# Patient Record
Sex: Female | Born: 1965
Health system: Southern US, Community
[De-identification: ages and names within clinical notes are randomized; demographics above are authoritative.]

## PROBLEM LIST (undated history)

## (undated) DIAGNOSIS — D353 Benign neoplasm of craniopharyngeal duct: Secondary | ICD-10-CM

## (undated) DIAGNOSIS — F419 Anxiety disorder, unspecified: Secondary | ICD-10-CM

## (undated) DIAGNOSIS — E669 Obesity, unspecified: Secondary | ICD-10-CM

## (undated) DIAGNOSIS — D352 Benign neoplasm of pituitary gland: Secondary | ICD-10-CM

## (undated) DIAGNOSIS — J309 Allergic rhinitis, unspecified: Secondary | ICD-10-CM

## (undated) DIAGNOSIS — F329 Major depressive disorder, single episode, unspecified: Secondary | ICD-10-CM

## (undated) DIAGNOSIS — R609 Edema, unspecified: Secondary | ICD-10-CM

## (undated) DIAGNOSIS — J302 Other seasonal allergic rhinitis: Secondary | ICD-10-CM

## (undated) HISTORY — DX: Other seasonal allergic rhinitis: J30.2

## (undated) HISTORY — DX: Allergic rhinitis, unspecified: J30.9

## (undated) HISTORY — DX: Major depressive disorder, single episode, unspecified: F32.9

## (undated) HISTORY — PX: NASAL SINUS SURGERY: SHX719

## (undated) HISTORY — PX: LIPOSUCTION: SHX10

## (undated) HISTORY — DX: Benign neoplasm of craniopharyngeal duct: D35.3

## (undated) HISTORY — DX: Benign neoplasm of pituitary gland: D35.2

## (undated) HISTORY — DX: Anxiety disorder, unspecified: F41.9

## (undated) HISTORY — PX: HAMMER TOE SURGERY: SHX385

## (undated) HISTORY — DX: Edema, unspecified: R60.9

## (undated) HISTORY — DX: Obesity, unspecified: E66.9

---

## 2002-09-13 HISTORY — PX: BREAST SURGERY: SHX581

## 2008-03-04 ENCOUNTER — Ambulatory Visit: Payer: Self-pay | Admitting: Internal Medicine

## 2008-03-04 DIAGNOSIS — F3289 Other specified depressive episodes: Secondary | ICD-10-CM

## 2008-03-04 DIAGNOSIS — J309 Allergic rhinitis, unspecified: Secondary | ICD-10-CM

## 2008-03-04 DIAGNOSIS — F339 Major depressive disorder, recurrent, unspecified: Secondary | ICD-10-CM | POA: Insufficient documentation

## 2008-03-04 DIAGNOSIS — F329 Major depressive disorder, single episode, unspecified: Secondary | ICD-10-CM

## 2008-03-04 HISTORY — DX: Allergic rhinitis, unspecified: J30.9

## 2008-03-04 HISTORY — DX: Other specified depressive episodes: F32.89

## 2008-03-04 HISTORY — DX: Major depressive disorder, single episode, unspecified: F32.9

## 2008-08-22 ENCOUNTER — Telehealth: Payer: Self-pay | Admitting: Internal Medicine

## 2008-10-07 ENCOUNTER — Telehealth (INDEPENDENT_AMBULATORY_CARE_PROVIDER_SITE_OTHER): Payer: Self-pay | Admitting: *Deleted

## 2008-11-15 ENCOUNTER — Telehealth: Payer: Self-pay | Admitting: Internal Medicine

## 2009-02-04 ENCOUNTER — Telehealth: Payer: Self-pay | Admitting: Internal Medicine

## 2009-03-13 ENCOUNTER — Telehealth: Payer: Self-pay | Admitting: Internal Medicine

## 2009-10-07 ENCOUNTER — Telehealth: Payer: Self-pay | Admitting: Internal Medicine

## 2009-10-07 ENCOUNTER — Ambulatory Visit: Payer: Self-pay | Admitting: Internal Medicine

## 2009-10-07 DIAGNOSIS — D353 Benign neoplasm of craniopharyngeal duct: Secondary | ICD-10-CM

## 2009-10-07 DIAGNOSIS — D352 Benign neoplasm of pituitary gland: Secondary | ICD-10-CM

## 2009-10-07 HISTORY — DX: Benign neoplasm of pituitary gland: D35.2

## 2009-10-09 LAB — CONVERTED CEMR LAB
AST: 30 units/L (ref 0–37)
Alkaline Phosphatase: 65 units/L (ref 39–117)
Basophils Relative: 0.5 % (ref 0.0–3.0)
Bilirubin, Direct: 0 mg/dL (ref 0.0–0.3)
Calcium: 9.6 mg/dL (ref 8.4–10.5)
Creatinine, Ser: 0.6 mg/dL (ref 0.4–1.2)
Eosinophils Absolute: 0.4 10*3/uL (ref 0.0–0.7)
Eosinophils Relative: 7.2 % — ABNORMAL HIGH (ref 0.0–5.0)
GFR calc non Af Amer: 115.77 mL/min (ref >60)
Hemoglobin: 14.5 g/dL (ref 12.0–15.0)
Lymphocytes Relative: 29.6 % (ref 12.0–46.0)
MCHC: 33 g/dL (ref 30.0–36.0)
Monocytes Relative: 10.2 % (ref 3.0–12.0)
Neutro Abs: 2.6 10*3/uL (ref 1.4–7.7)
Neutrophils Relative %: 52.5 % (ref 43.0–77.0)
RBC: 4.72 M/uL (ref 3.87–5.11)
Sodium: 140 meq/L (ref 135–145)
Total Protein: 7.3 g/dL (ref 6.0–8.3)
WBC: 5 10*3/uL (ref 4.5–10.5)

## 2009-10-15 ENCOUNTER — Telehealth (INDEPENDENT_AMBULATORY_CARE_PROVIDER_SITE_OTHER): Payer: Self-pay | Admitting: *Deleted

## 2010-03-12 ENCOUNTER — Telehealth: Payer: Self-pay | Admitting: Internal Medicine

## 2010-05-28 ENCOUNTER — Ambulatory Visit: Payer: Self-pay | Admitting: Internal Medicine

## 2010-05-28 DIAGNOSIS — J069 Acute upper respiratory infection, unspecified: Secondary | ICD-10-CM | POA: Insufficient documentation

## 2010-05-29 LAB — CONVERTED CEMR LAB: Prolactin: 54.2 ng/mL

## 2010-05-30 ENCOUNTER — Telehealth: Payer: Self-pay | Admitting: Family Medicine

## 2010-06-01 ENCOUNTER — Telehealth: Payer: Self-pay | Admitting: Internal Medicine

## 2010-06-22 ENCOUNTER — Ambulatory Visit: Payer: Self-pay | Admitting: Internal Medicine

## 2010-06-22 DIAGNOSIS — E669 Obesity, unspecified: Secondary | ICD-10-CM | POA: Insufficient documentation

## 2010-06-22 HISTORY — DX: Obesity, unspecified: E66.9

## 2010-09-28 ENCOUNTER — Telehealth: Payer: Self-pay | Admitting: Internal Medicine

## 2010-10-13 NOTE — Progress Notes (Signed)
Summary: call a nurse  Phone Note Other Incoming Call back at call a nurse   Summary of Call: Recieved call from call a nurse.  Pt was seen by Dr Kirtland Bouchard yesterday, on Abx for sinusitis and requests RX cough syrup for night.  She does NOT have a codeine allergy.  I will send over RX. Initial call taken by: Seymour Bars DO,  May 30, 2010 12:30 PM    New/Updated Medications: CHERATUSSIN AC 100-10 MG/5ML SYRP (GUAIFENESIN-CODEINE) 5-10 ml by mouth at bedtime as needed cough Prescriptions: CHERATUSSIN AC 100-10 MG/5ML SYRP (GUAIFENESIN-CODEINE) 5-10 ml by mouth at bedtime as needed cough  #120 ml x 0   Entered and Authorized by:   Seymour Bars DO   Signed by:   Seymour Bars DO on 05/30/2010   Method used:   Printed then faxed to ...       [facility]  [phone]* (retail)       [address] [place], [place]  [address]       Ph: [phone] or [phone]       Fax: [phone]   RxID:   [id number]

## 2010-10-13 NOTE — Assessment & Plan Note (Signed)
Summary: SINUSITIS? // RS   Vital Signs:  Patient profile:   45 year old female Weight:      163 pounds Temp:     98.5 degrees F oral BP sitting:   122 / 84  (right arm) Cuff size:   regular  Vitals Entered By: Duard Brady LPN (May 28, 2010 11:01 AM) CC: c/o sinus infection? congestion , ear pain Is Patient Diabetic? No   CC:  c/o sinus infection? congestion  and ear pain.  History of Present Illness: 45 year old patient who is seen today with a two day history of sinus congestion, mild sore throat, and itching involving the right ear.  Portion is sinus congestion.  There's been no fever or purulent drainage.  She does complain of some sinus fullness and discomfort, but nonlocalized.  She has a history depression, which has been stable. she  also has a history of a pituitary microadenoma and bromocriptine therapy has been discontinued.  Allergies: 1)  ! Septra Ds (Sulfamethoxazole-Trimethoprim)  Past History:  Past Medical History: Reviewed history from 03/04/2008 and no changes required. prolactinoma Allergic rhinitis Depression pedal edema  Review of Systems       The patient complains of anorexia and hoarseness.  The patient denies fever, weight loss, weight gain, vision loss, decreased hearing, chest pain, syncope, dyspnea on exertion, peripheral edema, prolonged cough, headaches, hemoptysis, abdominal pain, melena, hematochezia, severe indigestion/heartburn, hematuria, incontinence, genital sores, muscle weakness, suspicious skin lesions, transient blindness, difficulty walking, depression, unusual weight change, abnormal bleeding, enlarged lymph nodes, angioedema, and breast masses.    Physical Exam  General:  Well-developed,well-nourished,in no acute distress; alert,appropriate and cooperative throughout examination Head:  Normocephalic and atraumatic without obvious abnormalities. No apparent alopecia or balding. Eyes:  No corneal or conjunctival  inflammation noted. EOMI. Perrla. Funduscopic exam benign, without hemorrhages, exudates or papilledema. Vision grossly normal. Nose:  External nasal examination shows no deformity or inflammation. Nasal mucosa are pink and moist without lesions or exudates. Mouth:  Oral mucosa and oropharynx without lesions or exudates.  Teeth in good repair. Neck:  No deformities, masses, or tenderness noted. Lungs:  Normal respiratory effort, chest expands symmetrically. Lungs are clear to auscultation, no crackles or wheezes. Heart:  Normal rate and regular rhythm. S1 and S2 normal without gallop, murmur, click, rub or other extra sounds.   Impression & Recommendations:  Problem # 1:  URI (ICD-465.9)  Problem # 2:  PITUITARY MICROADENOMA (ICD-227.3)  will check a prolactin level  Orders: Venipuncture (40981) TLB-Prolactin (84146-PROL) Specimen Handling (19147)  Orders: Venipuncture (82956) TLB-Prolactin (84146-PROL) Specimen Handling (21308)  Complete Medication List: 1)  Triamterene-hctz 37.5-25 Mg Caps (Triamterene-hctz) .Marland Kitchen.. 1 once daily 2)  Effexor Xr 150 Mg Cp24 (Venlafaxine hcl) .Marland Kitchen.. 1 once daily 3)  Effexor Xr 75 Mg Xr24h-cap (Venlafaxine hcl) .... One daily ( this is in addition to the 150mg  daily.  will be total 225mg  daily.) 4)  Fluticasone Propionate 50 Mcg/act Susp (Fluticasone propionate) .... Use daily  Patient Instructions: 1)  Get plenty of rest, drink lots of clear liquids, and use Tylenol  for fever and comfort. Return in 7-10 days if you're not better:sooner if you're feeling worse. 2)  ViMOVO one twice daily Prescriptions: FLUTICASONE PROPIONATE 50 MCG/ACT SUSP (FLUTICASONE PROPIONATE) use daily  #4 x 4   Entered and Authorized by:   Gordy Savers  MD   Signed by:   Gordy Savers  MD on 05/28/2010   Method used:   Electronically to  [facility]  [phone]* (retail)       [address] [place], [place]  [address]       Ph: [phone] or  [phone]       Fax: [phone]   RxID:   [id number] EFFEXOR XR 75 MG XR24H-CAP (VENLAFAXINE HCL) one daily ( this is in addition to the 150mg  daily.  Will be total 225mg  daily.)  #30 x 5   Entered and Authorized by:   Gordy Savers  MD   Signed by:   Gordy Savers  MD on 05/28/2010   Method used:   Electronically to        [facility]  [phone]* (retail)       [address] [place], [place]  [address]       Ph: [phone] or [phone]       Fax: [phone]   RxID:   [id number] EFFEXOR XR 150 MG  CP24 (VENLAFAXINE HCL) 1 once daily  #90 Capsule x 4   Entered and Authorized by:   Gordy Savers  MD   Signed by:   Gordy Savers  MD on 05/28/2010   Method used:   Electronically to        [facility]  [phone]* (retail)       [address] [place], [place]  [address]       Ph: [phone] or [phone]       Fax: [phone]   RxID:   [id number] TRIAMTERENE-HCTZ 37.5-25 MG  CAPS (TRIAMTERENE-HCTZ) 1 once daily  #90 x 3   Entered and Authorized by:   Gordy Savers  MD   Signed by:   Gordy Savers  MD on 05/28/2010   Method used:   Electronically to        [facility]  [phone]* (retail)       [address] [place], [place]  [address]       Ph: [phone] or [phone]       Fax: [phone]   RxID:   [id number]

## 2010-10-13 NOTE — Progress Notes (Signed)
Summary: Pt did not rcv script for Valium as discussed  Phone Note Call from Patient Call back at Home Phone [phone]   Caller: Patient Summary of Call: Pt called and said that she thought Dr, Amador Cunas was going to prescribed Valium, but pt never rcvd script for this med. [facility].  Initial call taken by: Lucy Antigua,  October 07, 2009 10:13 AM  Follow-up for Phone Call        generic Valium 5 mg  #50 one twice daily as needed  RF 2 Follow-up by: Gordy Savers  MD,  October 07, 2009 1:09 PM  Additional Follow-up for Phone Call Additional follow up Details #1::        Rx Called In Additional Follow-up by: Raechel Ache, RN,  October 07, 2009 1:26 PM

## 2010-10-13 NOTE — Progress Notes (Signed)
Summary: Call-A-Nurse Report    Call-A-Nurse Triage Call Report Triage Record Num: [id number] Operator: Patriciaann Clan Patient Name: Chelsea Dyer Call Date & Time: 05/30/2010 12:14:32PM Patient Phone: [phone] PCP: Gordy Savers Patient Gender: Female PCP Fax : [phone] Patient DOB: [birth year] Practice Name: [facility] Reason for Call: LMP 05/29/10. Patient calling. States developed cough, itching of ears, sore throat, chest congestion. Onset 05/29/10. Afebrile. States expectorating green sputum. Patient states she was seen in ofice 05/29/10 and started on antibiotic. Patient requesting cough medication with codiene to use at night. [facility] @ [phone]. 1227: Dr. Seymour Bars notified. Order received: Advise patient that a Rx will be faxed to her Pharmacy. Patient advised of above. Call back parameters reviewed. Patient verbalizes undertanding. Protocol(s) Used: Upper Respiratory Infection (URI) Recommended Outcome per Protocol: See Alyzabeth Pontillo within 24 hours Override Outcome if Used in Protocol: Call Erum Cercone Immediately RN Reason for Override Outcome: Per Caller Request. Reason for Outcome: Productive cough with colored sputum (other than clear or white sputum) Care Advice:  ~ Use a cool mist humidifier to moisten air. Be sure to clean according to manufacturer's instructions.  ~ May inhale steam from hot shower or heated water. Be careful to avoid burns. Limit or avoid exposure to irritants and allergens (e.g. air pollution, smoke/smoking, chemicals, dust, pollen, pet dander, etc.)  ~ Increase fluids to 8-12 eight oz (1.6 to 2.4 liters) glasses per day, half of them to be water. Soups, popsicles, fruit juices, non-caffeinated sodas (unless restricting sodium intake), jello, broths, decaf teas, etc. are all okay. Warm fluids can be soothing.  ~  ~ If you can, stop smoking now and avoid all secondhand smoke.  ~ Warm fluids may  help, or try a mixture of honey and lemon juice in warm tea.  ~ SYMPTOM / CONDITION MANAGEMENT  ~ CAUTIONS Coughing up mucus or phlegm helps to get rid of an infection. A productive cough should not be stopped. A cough medicine with guaifenesin (Robitussin, Mucinex) can help loosen the mucus. Cough medicine with dextromethorphan (DM) should be avoided. Drinking lots of fluids can help loosen the mucus too, especially warm fluids.  ~ Call Glennis Montenegro if has a fever over 101.5 F (38.6 C) that has not responded to home care measures, having shaking chills or any fever in someone immunocompromised/frail elderly.  ~ 05/30/2010 12:33:38PM Page 1 of 1 [id number]

## 2010-10-13 NOTE — Assessment & Plan Note (Signed)
Summary: trouble losing wt/njr   Vital Signs:  Patient profile:   45 year old female Height:      62 inches Weight:      165 pounds BMI:     30.29 BP sitting:   108 / 80  (left arm) Cuff size:   regular  Vitals Entered By: Duard Brady LPN (June 22, 2010 4:16 PM) CC: c/o not able to lose wt Is Patient Diabetic? No   CC:  c/o not able to lose wt.  History of Present Illness: 45 year old patient who is seen today with a chief complaint of inability to lose weight.  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  She has been on a diet plan and has been a participant in Weight Wat[employer] in the past.  Her weight has been static.  She had a thyroid test checked recently had a walk-in lab.  Thyroid function studies in January were normal.  In general, she feels well.  No symptoms of hypothyroidism, and no family history.  She does have a history of depression on Effexor  Allergies: 1)  ! Septra Ds (Sulfamethoxazole-Trimethoprim)  Past History:  Past Medical History: prolactinoma Allergic rhinitis Depression pedal edema obesity  Review of Systems       The patient complains of weight gain.  The patient denies anorexia, fever, weight loss, vision loss, decreased hearing, hoarseness, chest pain, syncope, dyspnea on exertion, peripheral edema, prolonged cough, headaches, hemoptysis, abdominal pain, melena, hematochezia, severe indigestion/heartburn, hematuria, incontinence, genital sores, muscle weakness, suspicious skin lesions, transient blindness, difficulty walking, depression, unusual weight change, abnormal bleeding, enlarged lymph nodes, angioedema, and breast masses.    Physical Exam  General:  overweight-appearing.  normal blood pressureoverweight-appearing.   Head:  Normocephalic and atraumatic without obvious abnormalities. No apparent alopecia or balding. Mouth:  Oral mucosa and oropharynx without lesions or exudates.  Teeth  in good repair. Lungs:  Normal respiratory effort, chest expands symmetrically. Lungs are clear to auscultation, no crackles or wheezes. Heart:  Normal rate and regular rhythm. S1 and S2 normal without gallop, murmur, click, rub or other extra sounds. Neurologic:  reflexes normal   Impression & Recommendations:  Problem # 1:  EXOGENOUS OBESITY (ICD-278.00)  Problem # 2:  PITUITARY MICROADENOMA (ICD-227.3) will check a prolactin level at the next office visit  Complete Medication List: 1)  Triamterene-hctz 37.5-25 Mg Caps (Triamterene-hctz) .Marland Kitchen.. 1 once daily 2)  Effexor Xr 150 Mg Cp24 (Venlafaxine hcl) .Marland Kitchen.. 1 once daily 3)  Effexor Xr 75 Mg Xr24h-cap (Venlafaxine hcl) .... One daily ( this is in addition to the 150mg  daily.  will be total 225mg  daily.) 4)  Fluticasone Propionate 50 Mcg/act Susp (Fluticasone propionate) .... Use daily 5)  Cheratussin Ac 100-10 Mg/82ml Syrp (Guaifenesin-codeine) .... 5-10 ml by mouth at bedtime as needed cough 6)  Phentermine Hcl 37.5 Mg Caps (Phentermine hcl) .... One every am  Other Orders: Flu Vaccine 76yrs + (40973) Admin 1st Vaccine (53299)  Patient Instructions: 1)  It is important that you exercise regularly at least 20 minutes 5 times a week. If you develop chest pain, have severe difficulty breathing, or feel very tired , stop exercising immediately and seek medical attention. 2)  You need to lose weight. Consider a lower calorie diet and regular exercise.  3)  Please schedule a follow-up appointment in 2 months. Prescriptions: PHENTERMINE HCL 37.5 MG CAPS (PHENTERMINE HCL) one every am  #50 x 1  Entered and Authorized by:   Gordy Savers  MD   Signed by:   Gordy Savers  MD on 06/22/2010   Method used:   Print then Give to Patient   RxID:   [id number]    Immunizations Administered:  Influenza Vaccine # 1:    Vaccine Type: Fluvax 3+    Site: right deltoid    Mfr: GlaxoSmithKline    Dose: 0.5 ml    Route: IM     Given by: Duard Brady LPN    Exp. Date: 03/13/2011    Lot #: [id number]    VIS given: 04/07/10 version given June 22, 2010.    Physician counseled: yes  Flu Vaccine Consent Questions:    Do you have a history of severe allergic reactions to this vaccine? no    Any prior history of allergic reactions to egg and/or gelatin? no    Do you have a sensitivity to the preservative Thimersol? no    Do you have a past history of Guillan-Barre Syndrome? no    Do you currently have an acute febrile illness? no    Have you ever had a severe reaction to latex? no    Vaccine information given and explained to patient? yes    Are you currently pregnant? no

## 2010-10-13 NOTE — Progress Notes (Signed)
Summary: refill triamterene  Phone Note Refill Request Call back at Home Phone [phone] Message from:  Patient--live call  Refills Requested: Medication #1:  TRIAMTERENE-HCTZ 37.5-25 MG  CAPS 1 once daily [facility].  Initial call taken by: Warnell Forester,  March 12, 2010 11:30 AM    Prescriptions: TRIAMTERENE-HCTZ 37.5-25 MG  CAPS (TRIAMTERENE-HCTZ) 1 once daily  #90 x 3   Entered by:   Duard Brady LPN   Authorized by:   Gordy Savers  MD   Signed by:   Duard Brady LPN on 65/78/4696   Method used:   Electronically to        [facility]  [phone]* (retail)       [address] [place], [place]  [address]       Ph: [phone] or [phone]       Fax: [phone]   RxID:   [id number]

## 2010-10-13 NOTE — Assessment & Plan Note (Signed)
Summary: MED CHECK//SLM   Vital Signs:  Patient profile:   45 year old female Weight:      163 pounds BP sitting:   120 / 90  (left arm) Cuff size:   regular  Vitals Entered By: Raechel Ache, RN (October 07, 2009 8:46 AM) CC: Med check.   CC:  Med check..  History of Present Illness: 45 year old patient who has not been seen since she established with this practice  about a year and a half ago.  She has been on bromocriptine for a prolactinoma for about 12 years.  This has raised her health insurance rates and she is wondering if this medicine could perhaps be discontinued.  She has a history of depression and allergic rhinitis.  She is still having difficulty with a custody battle and is requesting Valium to take p.r.n.  She has not taken this in a number of years, but has done well in the past.  Her depression has been stable  Allergies: 1)  ! Septra Ds (Sulfamethoxazole-Trimethoprim)  Past History:  Past Medical History: Reviewed history from 03/04/2008 and no changes required. prolactinoma Allergic rhinitis Depression pedal edema  Family History: Reviewed history from 03/04/2008 and no changes required. no details of her father's health unknown mother, age 72, history of osteoarthritis, otherwise in excellent health  One brother two sisters in good health  Social History: Reviewed history from 03/04/2008 and no changes required. Divorced Never Smoked has a daughter, age 49 with Down syndrome, who she shares custody; ex-husband lives in [place]  Review of Systems       The patient complains of depression.  The patient denies anorexia, fever, weight loss, weight gain, vision loss, decreased hearing, hoarseness, chest pain, syncope, dyspnea on exertion, peripheral edema, prolonged cough, headaches, hemoptysis, abdominal pain, melena, hematochezia, severe indigestion/heartburn, hematuria, incontinence, genital sores, muscle weakness, suspicious skin lesions, transient  blindness, difficulty walking, unusual weight change, abnormal bleeding, enlarged lymph nodes, angioedema, and breast masses.    Physical Exam  General:  overweight-appearing.  normal blood pressureoverweight-appearing.   Head:  Normocephalic and atraumatic without obvious abnormalities. No apparent alopecia or balding. Eyes:  No corneal or conjunctival inflammation noted. EOMI. Perrla. Funduscopic exam benign, without hemorrhages, exudates or papilledema. Vision grossly normal. Ears:  External ear exam shows no significant lesions or deformities.  Otoscopic examination reveals clear canals, tympanic membranes are intact bilaterally without bulging, retraction, inflammation or discharge. Hearing is grossly normal bilaterally. Mouth:  Oral mucosa and oropharynx without lesions or exudates.  Teeth in good repair. Neck:  No deformities, masses, or tenderness noted. Chest Wall:  No deformities, masses, or tenderness noted. Lungs:  Normal respiratory effort, chest expands symmetrically. Lungs are clear to auscultation, no crackles or wheezes. Heart:  Normal rate and regular rhythm. S1 and S2 normal without gallop, murmur, click, rub or other extra sounds.   Impression & Recommendations:  Problem # 1:  DEPRESSION (ICD-311)  Her updated medication list for this problem includes:    Effexor Xr 150 Mg Cp24 (Venlafaxine hcl) .Marland Kitchen... 1 once daily    Effexor Xr 75 Mg Xr24h-cap (Venlafaxine hcl) ..... One daily ( this is in addition to the 150mg  daily.  will be total 225mg  daily.)    Diazepam 5 Mg Tabs (Diazepam) ..... One twice daily as needed for anxiety    Her updated medication list for this problem includes:    Effexor Xr 150 Mg Cp24 (Venlafaxine hcl) .Marland Kitchen... 1 once daily    Effexor Xr 75  Mg Xr24h-cap (Venlafaxine hcl) ..... One daily ( this is in addition to the 150mg  daily.  will be total 225mg  daily.)    Diazepam 5 Mg Tabs (Diazepam) ..... One twice daily as needed for anxiety  Problem # 2:   ALLERGIC RHINITIS (ICD-477.9)  The following medications were removed from the medication list:    Xyzal 5 Mg Tabs (Levocetirizine dihydrochloride) .Marland Kitchen... 1 at bedtime    Astelin 137 Mcg/spray Soln (Azelastine hcl) ..... Uad    Nasonex 50 Mcg/act Susp (Mometasone furoate) ..... Uad Her updated medication list for this problem includes:    Fluticasone Propionate 50 Mcg/act Susp (Fluticasone propionate) ..... Use daily    The following medications were removed from the medication list:    Xyzal 5 Mg Tabs (Levocetirizine dihydrochloride) .Marland Kitchen... 1 at bedtime    Astelin 137 Mcg/spray Soln (Azelastine hcl) ..... Uad    Nasonex 50 Mcg/act Susp (Mometasone furoate) ..... Uad Her updated medication list for this problem includes:    Fluticasone Propionate 50 Mcg/act Susp (Fluticasone propionate) ..... Use daily  Problem # 3:  PITUITARY MICROADENOMA (ICD-227.3)  will check a prolactin level today and repeat in 3 months; will hold bromocriptine  Complete Medication List: 1)  Triamterene-hctz 37.5-25 Mg Caps (Triamterene-hctz) .Marland Kitchen.. 1 once daily 2)  Effexor Xr 150 Mg Cp24 (Venlafaxine hcl) .Marland Kitchen.. 1 once daily 3)  Bromocriptine Mesylate 2.5 Mg Tabs (Bromocriptine mesylate) .Marland Kitchen.. 1 once daily 4)  Effexor Xr 75 Mg Xr24h-cap (Venlafaxine hcl) .... One daily ( this is in addition to the 150mg  daily.  will be total 225mg  daily.) 5)  Fluticasone Propionate 50 Mcg/act Susp (Fluticasone propionate) .... Use daily 6)  Diazepam 5 Mg Tabs (Diazepam) .... One twice daily as needed for anxiety  Other Orders: Venipuncture (16109) TLB-BMP (Basic Metabolic Panel-BMET) (80048-METABOL) TLB-CBC Platelet - w/Differential (85025-CBCD) TLB-Hepatic/Liver Function Pnl (80076-HEPATIC) TLB-TSH (Thyroid Stimulating Hormone) (84443-TSH) TLB-Prolactin (84146-PROL)  Patient Instructions: 1)  Please schedule a follow-up appointment in 3 months. 2)  Limit your Sodium (Salt). 3)  It is important that you exercise regularly at  least 20 minutes 5 times a week. If you develop chest pain, have severe difficulty breathing, or feel very tired , stop exercising immediately and seek medical attention. Prescriptions: DIAZEPAM 5 MG TABS (DIAZEPAM) one twice daily as needed for anxiety  #50 x 2   Entered and Authorized by:   Gordy Savers  MD   Signed by:   Gordy Savers  MD on 10/07/2009   Method used:   Print then Give to Patient   RxID:   [id number] FLUTICASONE PROPIONATE 50 MCG/ACT SUSP (FLUTICASONE PROPIONATE) use daily  #4 x 4   Entered and Authorized by:   Gordy Savers  MD   Signed by:   Gordy Savers  MD on 10/07/2009   Method used:   Print then Give to Patient   RxID:   [id number] EFFEXOR XR 75 MG XR24H-CAP (VENLAFAXINE HCL) one daily ( this is in addition to the 150mg  daily.  Will be total 225mg  daily.)  #30 x 5   Entered and Authorized by:   Gordy Savers  MD   Signed by:   Gordy Savers  MD on 10/07/2009   Method used:   Print then Give to Patient   RxID:   [id number] BROMOCRIPTINE MESYLATE 2.5 MG  TABS (BROMOCRIPTINE MESYLATE) 1 once daily  #90 x 4   Entered and Authorized by:   Gordy Savers  MD  Signed by:   Gordy Savers  MD on 10/07/2009   Method used:   Print then Give to Patient   RxID:   [id number] EFFEXOR XR 150 MG  CP24 (VENLAFAXINE HCL) 1 once daily  #90 Capsule x 4   Entered and Authorized by:   Gordy Savers  MD   Signed by:   Gordy Savers  MD on 10/07/2009   Method used:   Print then Give to Patient   RxID:   [id number] TRIAMTERENE-HCTZ 37.5-25 MG  CAPS (TRIAMTERENE-HCTZ) 1 once daily  #30 x 4   Entered and Authorized by:   Gordy Savers  MD   Signed by:   Gordy Savers  MD on 10/07/2009   Method used:   Print then Give to Patient   RxID:   [id number]

## 2010-10-14 ENCOUNTER — Encounter: Payer: Self-pay | Admitting: Internal Medicine

## 2010-10-15 ENCOUNTER — Ambulatory Visit (INDEPENDENT_AMBULATORY_CARE_PROVIDER_SITE_OTHER): Payer: Managed Care, Other (non HMO) | Admitting: Internal Medicine

## 2010-10-15 ENCOUNTER — Encounter: Payer: Self-pay | Admitting: Internal Medicine

## 2010-10-15 DIAGNOSIS — J019 Acute sinusitis, unspecified: Secondary | ICD-10-CM

## 2010-10-15 DIAGNOSIS — J309 Allergic rhinitis, unspecified: Secondary | ICD-10-CM

## 2010-10-15 MED ORDER — DOXYCYCLINE HYCLATE 100 MG PO TABS: 100.0000 mg | ORAL_TABLET | Freq: Two times a day (BID) | ORAL | Status: AC

## 2010-10-15 MED ORDER — HYDROCODONE-HOMATROPINE 5-1.5 MG/5ML PO SYRP
5.0000 mL | ORAL_SOLUTION | Freq: Four times a day (QID) | ORAL | Status: AC | PRN
Start: 2010-10-15 — End: 2010-10-25

## 2010-10-15 NOTE — Patient Instructions (Signed)
Take your antibiotic as prescribed until ALL of it is gone, but stop if you develop a rash, swelling, or any side effects of the medication.  Contact our office as soon as possible if  there are side effects of the medication. 

## 2010-10-15 NOTE — Progress Notes (Signed)
Summary: refill phentermine  Phone Note Refill Request Message from:  Fax from Pharmacy on September 28, 2010 12:23 PM  Refills Requested: Medication #1:  PHENTERMINE HCL 37.5 MG CAPS one every am.   [facility]: [facility]/2010 [facility]   Method Requested: Fax to Local Pharmacy Initial call taken by: Duard Brady LPN,  September 28, 2010 12:24 PM  Follow-up for Phone Call        #60 no RF Follow-up by: Gordy Savers  MD,  September 28, 2010 12:33 PM    Prescriptions: PHENTERMINE HCL 37.5 MG CAPS (PHENTERMINE HCL) one every am  #60 x 0   Entered by:   Duard Brady LPN   Authorized by:   Gordy Savers  MD   Signed by:   Duard Brady LPN on 04/54/0981   Method used:   Historical   RxID:   [id number]

## 2010-10-15 NOTE — Progress Notes (Signed)
  Subjective:    Patient ID: Chelsea Dyer, female    DOB: Sep 30, 1965, 45 y.o.   MRN: [id number]  HPI   45 year old patient who is seen today with a 5-day history of worsening sinus pain.  She has had yellow to green discharge and also has had a mildly productive cough.  She has had headache, sinus pressure, ear pain.  She has had low-grade fever and a general sense of unwellness.  She has been using Mucinex and OTC medications without much benefit.  She does have a history of allergic rhinitis.  Allergies include sulfa.   Review of Systems  Constitutional: Negative for fever and fatigue.  HENT: Positive for ear pain, congestion, rhinorrhea and postnasal drip. Negative for hearing loss, sore throat, dental problem, sinus pressure and tinnitus.   Eyes: Negative for pain, discharge and visual disturbance.  Respiratory: Positive for cough (productive of yellow sputum). Negative for shortness of breath.   Cardiovascular: Negative for chest pain, palpitations and leg swelling.  Gastrointestinal: Negative for nausea, vomiting, abdominal pain, diarrhea, constipation, blood in stool and abdominal distention.  Genitourinary: Negative for dysuria, urgency, frequency, hematuria, flank pain, vaginal bleeding, vaginal discharge, difficulty urinating, vaginal pain and pelvic pain.  Musculoskeletal: Negative for joint swelling, arthralgias and gait problem.  Skin: Negative for rash.  Neurological: Negative for dizziness, syncope, speech difficulty, weakness, numbness and headaches.  Hematological: Negative for adenopathy. Does not bruise/bleed easily.  Psychiatric/Behavioral: Negative for behavioral problems, dysphoric mood and agitation. The patient is not nervous/anxious.        Objective:   Physical Exam  Constitutional: She is oriented to person, place, and time. She appears well-developed and well-nourished.  HENT:  Head: Normocephalic and atraumatic.  Right Ear: External ear normal.  Left Ear:  External ear normal.       Slight erythema of the oropharynx Both tympanic membranes were slightly dull Mild left maxillary sinus tenderness  Eyes: Conjunctivae and EOM are normal. Pupils are equal, round, and reactive to light.  Neck: Normal range of motion. Neck supple. No thyromegaly present.  Cardiovascular: Normal rate, regular rhythm, normal heart sounds and intact distal pulses.   Pulmonary/Chest: Effort normal and breath sounds normal.  Abdominal: Soft. Bowel sounds are normal. She exhibits no mass. There is no tenderness.  Musculoskeletal: Normal range of motion.  Lymphadenopathy:    She has no cervical adenopathy.  Neurological: She is alert and oriented to person, place, and time.  Skin: Skin is warm and dry. No rash noted.  Psychiatric: She has a normal mood and affect. Her behavior is normal.          Assessment & Plan:  1. Sinusitis-  Will continue Mucinex, and treat with doxycycline 100 mg b.i.d.  2.  Allergic rhinitis

## 2010-11-17 ENCOUNTER — Other Ambulatory Visit: Payer: Self-pay | Admitting: Internal Medicine

## 2010-11-23 ENCOUNTER — Ambulatory Visit: Payer: Managed Care, Other (non HMO) | Admitting: Internal Medicine

## 2010-12-07 ENCOUNTER — Other Ambulatory Visit: Payer: Self-pay | Admitting: Internal Medicine

## 2010-12-07 NOTE — Telephone Encounter (Signed)
Please advise - do not see on current med list

## 2010-12-07 NOTE — Telephone Encounter (Signed)
OK  #90  RF 3

## 2010-12-14 ENCOUNTER — Encounter: Payer: Self-pay | Admitting: Internal Medicine

## 2010-12-14 ENCOUNTER — Ambulatory Visit (INDEPENDENT_AMBULATORY_CARE_PROVIDER_SITE_OTHER): Payer: Managed Care, Other (non HMO) | Admitting: Internal Medicine

## 2010-12-14 VITALS — Temp 98.6°F | Wt 168.0 lb

## 2010-12-14 DIAGNOSIS — F3289 Other specified depressive episodes: Secondary | ICD-10-CM

## 2010-12-14 DIAGNOSIS — F329 Major depressive disorder, single episode, unspecified: Secondary | ICD-10-CM

## 2010-12-14 DIAGNOSIS — L259 Unspecified contact dermatitis, unspecified cause: Secondary | ICD-10-CM

## 2010-12-14 DIAGNOSIS — E669 Obesity, unspecified: Secondary | ICD-10-CM

## 2010-12-14 DIAGNOSIS — L255 Unspecified contact dermatitis due to plants, except food: Secondary | ICD-10-CM

## 2010-12-14 DIAGNOSIS — L237 Allergic contact dermatitis due to plants, except food: Secondary | ICD-10-CM

## 2010-12-14 MED ORDER — PREDNISONE 10 MG PO KIT: PACK | ORAL | Status: DC

## 2010-12-14 MED ORDER — METHYLPREDNISOLONE ACETATE 80 MG/ML IJ SUSP
80.0000 mg | Freq: Once | INTRAMUSCULAR | Status: AC
  Administered 2010-12-14: 80 mg via INTRAMUSCULAR

## 2010-12-14 NOTE — Progress Notes (Signed)
  Subjective:    Patient ID: Chelsea Dyer, female    DOB: 15-Aug-1966, 45 y.o.   MRN: [id number]  HPI   45 year old patient who has a prior history of contact dermatitis. 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 and was attempting to be careful with the use of gloves and other protective gear. However she has developed a very pruritic rash over the extremities and anterior chest and neck.  Review of Systems  Skin: Positive for rash.       Objective:   Physical Exam  Constitutional: She appears well-developed and well-nourished. No distress.  Skin: Skin is warm and dry. Rash noted.       Scattered erythematous at times vesicular rash over primarily the arms anterior chest and neck region. This was consistent with a contact dermatitis          Assessment & Plan:  Contact dermatitis. Will treat with Depo-Medrol as well as a 12 day 10 mg prednisone dose pack

## 2010-12-14 NOTE — Patient Instructions (Signed)
Prednisone dosepak as prescribed Use Claritin once daily  Consider bedtime dose of Benadryl  Call or return to clinic prn if these symptoms worsen or fail to improve as anticipated.

## 2010-12-25 ENCOUNTER — Telehealth: Payer: Self-pay | Admitting: *Deleted

## 2010-12-25 MED ORDER — DIAZEPAM 5 MG PO TABS: 5.0000 mg | ORAL_TABLET | Freq: Two times a day (BID) | ORAL | Status: AC | PRN

## 2010-12-25 NOTE — Telephone Encounter (Signed)
Pt states she is is having personal issues and wants to ask Dr. Kirtland Bouchard if he will [facility]

## 2010-12-25 NOTE — Telephone Encounter (Signed)
Generic Valium 5 mg one twice daily as needed for anxiety. #50

## 2010-12-25 NOTE — Telephone Encounter (Signed)
MED ORDERED AND PT AWARE . [facility]

## 2011-01-12 ENCOUNTER — Other Ambulatory Visit: Payer: Self-pay

## 2011-01-12 MED ORDER — PHENTERMINE HCL 37.5 MG PO CAPS: 37.5000 mg | ORAL_CAPSULE | ORAL | Status: DC

## 2011-01-12 NOTE — Telephone Encounter (Signed)
#  60 needs ROV prior to another RF

## 2011-01-12 NOTE — Telephone Encounter (Signed)
[facility] [address] for Phentermine 37.5  Written 09/28/2010 #60 - last filled 10/25/10 Please advise

## 2011-01-12 NOTE — Telephone Encounter (Signed)
[facility] 

## 2011-03-23 ENCOUNTER — Other Ambulatory Visit: Payer: Self-pay

## 2011-03-23 MED ORDER — PHENTERMINE HCL 37.5 MG PO CAPS: 37.5000 mg | ORAL_CAPSULE | ORAL | Status: DC

## 2011-03-23 NOTE — Telephone Encounter (Signed)
[facility] Phentermine - last written 01/12/11  #60 0RF Please advise

## 2011-03-23 NOTE — Telephone Encounter (Signed)
60

## 2011-03-23 NOTE — Telephone Encounter (Signed)
[facility] 

## 2011-04-01 ENCOUNTER — Encounter: Payer: Self-pay | Admitting: Internal Medicine

## 2011-04-01 ENCOUNTER — Ambulatory Visit (INDEPENDENT_AMBULATORY_CARE_PROVIDER_SITE_OTHER): Payer: Managed Care, Other (non HMO) | Admitting: Internal Medicine

## 2011-04-01 VITALS — BP 108/68 | Temp 98.1°F | Wt 171.0 lb

## 2011-04-01 DIAGNOSIS — L819 Disorder of pigmentation, unspecified: Secondary | ICD-10-CM

## 2011-04-01 DIAGNOSIS — L814 Other melanin hyperpigmentation: Secondary | ICD-10-CM

## 2011-04-01 NOTE — Progress Notes (Signed)
  Subjective:    Patient ID: Chelsea Dyer, female    DOB: 05/30/1966, 45 y.o.   MRN: [id number]  HPI  45 year old patient who is seen today concerned about several skin lesions.    Review of Systems  Skin: Positive for rash.       Objective:   Physical Exam  Skin:       The patient had an approximate 1 cm lesion involving her left inner ankle this was vascular blanched with pressure and appeared to be a small hemangioma;  other lesions were present over her arms and lower extremities consistent with solar lentigines. She had a small subcutaneous nodule involving the back of her left arm consistent with a small sebaceous cyst the lesion was palpable but not visible          Assessment & Plan:   Benign skin lesions. Skin care discussed including avoiding excessive sun exposure. She worked return when necessary

## 2011-04-01 NOTE — Patient Instructions (Signed)
Avoid excessive sun exposure. Wear hats and apply sunscreen when appropriate  Call or return to clinic prn if these symptoms worsen or fail to improve as anticipated.

## 2011-04-07 ENCOUNTER — Other Ambulatory Visit: Payer: Self-pay | Admitting: Internal Medicine

## 2011-04-07 DIAGNOSIS — Z1231 Encounter for screening mammogram for malignant neoplasm of breast: Secondary | ICD-10-CM

## 2011-04-15 ENCOUNTER — Ambulatory Visit
Admission: RE | Admit: 2011-04-15 | Discharge: 2011-04-15 | Disposition: A | Discharge status: Home / Self Care | Payer: Managed Care, Other (non HMO) | Source: Ambulatory Visit | Attending: Internal Medicine | Admitting: Internal Medicine

## 2011-04-15 DIAGNOSIS — Z1231 Encounter for screening mammogram for malignant neoplasm of breast: Secondary | ICD-10-CM

## 2011-04-16 ENCOUNTER — Ambulatory Visit: Payer: Managed Care, Other (non HMO)

## 2011-05-18 ENCOUNTER — Ambulatory Visit (INDEPENDENT_AMBULATORY_CARE_PROVIDER_SITE_OTHER): Payer: Managed Care, Other (non HMO) | Admitting: Internal Medicine

## 2011-05-18 ENCOUNTER — Encounter: Payer: Self-pay | Admitting: Internal Medicine

## 2011-05-18 DIAGNOSIS — J069 Acute upper respiratory infection, unspecified: Secondary | ICD-10-CM

## 2011-05-18 DIAGNOSIS — J309 Allergic rhinitis, unspecified: Secondary | ICD-10-CM

## 2011-05-18 NOTE — Patient Instructions (Signed)
Get plenty of rest, Drink lots of  clear liquids, and use Tylenol or ibuprofen for fever and discomfort.    Acute sinusitis symptoms for less than 10 days are generally not helped by antibiotic therapy.  Use saline irrigation, warm  moist compresses and over-the-counter decongestants only as directed.  Call if there is no improvement in 5 to 7 days, or sooner if you develop increasing pain, fever, or any new symptoms.

## 2011-05-18 NOTE — Progress Notes (Signed)
  Subjective:    Patient ID: Chelsea Dyer, female    DOB: 1966/07/07, 45 y.o.   MRN: [id number]  HPI   45 year old patient who presents with a three-day history of URI symptoms. She complains of head and chest congestion and a chief complaint of productive cough. Cough is bothersome and interferes with sleep. There's been no fever. She remains on Flonase as well as nasal irrigation. No focal sinus tenderness or fever. No purulent drainage    Review of Systems  Constitutional: Negative.   HENT: Positive for congestion and rhinorrhea. Negative for hearing loss, sore throat, dental problem, sinus pressure and tinnitus.   Eyes: Negative for pain, discharge and visual disturbance.  Respiratory: Positive for cough. Negative for shortness of breath.   Cardiovascular: Negative for chest pain, palpitations and leg swelling.  Gastrointestinal: Negative for nausea, vomiting, abdominal pain, diarrhea, constipation, blood in stool and abdominal distention.  Genitourinary: Negative for dysuria, urgency, frequency, hematuria, flank pain, vaginal bleeding, vaginal discharge, difficulty urinating, vaginal pain and pelvic pain.  Musculoskeletal: Negative for joint swelling, arthralgias and gait problem.  Skin: Negative for rash.  Neurological: Negative for dizziness, syncope, speech difficulty, weakness, numbness and headaches.  Hematological: Negative for adenopathy.  Psychiatric/Behavioral: Negative for behavioral problems, dysphoric mood and agitation. The patient is not nervous/anxious.        Objective:   Physical Exam  Constitutional: She is oriented to person, place, and time. She appears well-developed and well-nourished.  HENT:  Head: Normocephalic.  Right Ear: External ear normal.  Left Ear: External ear normal.  Mouth/Throat: Oropharynx is clear and moist.  Eyes: Conjunctivae and EOM are normal. Pupils are equal, round, and reactive to light.  Neck: Normal range of motion. Neck supple. No  thyromegaly present.  Cardiovascular: Normal rate, regular rhythm, normal heart sounds and intact distal pulses.   Pulmonary/Chest: Effort normal and breath sounds normal.  Abdominal: Soft. Bowel sounds are normal. She exhibits no mass. There is no tenderness.  Musculoskeletal: Normal range of motion.  Lymphadenopathy:    She has no cervical adenopathy.  Neurological: She is alert and oriented to person, place, and time.  Skin: Skin is warm and dry. No rash noted.  Psychiatric: She has a normal mood and affect. Her behavior is normal.          Assessment & Plan:   Viral URI Allergic rhinitis   We'll continue on Flonase and saline irrigation. We'll treat symptomatically. A call if she does not promptly improve.

## 2011-05-19 LAB — HM MAMMOGRAPHY: HM Mammogram: NEGATIVE

## 2011-05-20 ENCOUNTER — Telehealth: Payer: Self-pay | Admitting: Internal Medicine

## 2011-05-20 MED ORDER — CHLORPHENIRAMINE-HYDROCODONE 8-10 MG/5ML PO LQCR: 5.0000 mL | Freq: Two times a day (BID) | ORAL | Status: AC | PRN

## 2011-05-20 NOTE — Telephone Encounter (Signed)
Spoke with pt- informed of med - order done and called to pharmacy. KIK

## 2011-05-20 NOTE — Telephone Encounter (Signed)
Please advise 

## 2011-05-20 NOTE — Telephone Encounter (Signed)
Pt was recently in and given medication to help her sleep and feels the medication is not working and would like to know what she should do. Pt requesting that you contact her.

## 2011-05-20 NOTE — Telephone Encounter (Signed)
Please call in generic  Tussionex 4 ounces 1 teaspoon every 12 hours

## 2011-06-09 ENCOUNTER — Other Ambulatory Visit: Payer: Self-pay | Admitting: Internal Medicine

## 2011-06-11 ENCOUNTER — Other Ambulatory Visit: Payer: Self-pay | Admitting: Internal Medicine

## 2011-07-14 ENCOUNTER — Ambulatory Visit (INDEPENDENT_AMBULATORY_CARE_PROVIDER_SITE_OTHER): Payer: Managed Care, Other (non HMO) | Admitting: Family Medicine

## 2011-07-14 ENCOUNTER — Encounter: Payer: Self-pay | Admitting: Family Medicine

## 2011-07-14 VITALS — BP 102/78 | Temp 98.2°F | Wt 169.0 lb

## 2011-07-14 DIAGNOSIS — J069 Acute upper respiratory infection, unspecified: Secondary | ICD-10-CM

## 2011-07-14 NOTE — Patient Instructions (Signed)

## 2011-07-15 NOTE — Progress Notes (Signed)
  Subjective:    Patient ID: Chelsea Dyer, female    DOB: Jan 26, 1966, 45 y.o.   MRN: [id number]  HPI Patient seen with few day history of nasal congestion. She has some intermittent itching and pain right ear and also some mild sore throat. Denies any fever. Occasional postnasal drainage. Diffuse body aches but no fever or chills. Some increased malaise. Denies cough. No sick exposures. Has not taken any over-the-counter medications.   Review of Systems  Constitutional: Positive for fatigue. Negative for fever and chills.  HENT: Positive for ear pain, sore throat and postnasal drip.   Respiratory: Negative for cough, shortness of breath and wheezing.   Cardiovascular: Negative for chest pain.  Neurological: Negative for dizziness and headaches.       Objective:   Physical Exam  Constitutional: She appears well-developed and well-nourished.  HENT:  Right Ear: External ear normal.  Left Ear: External ear normal.  Mouth/Throat: Oropharynx is clear and moist.  Neck: Neck supple. No thyromegaly present.  Cardiovascular: Normal rate and regular rhythm.   Pulmonary/Chest: Effort normal and breath sounds normal. No respiratory distress. She has no wheezes. She has no rales.  Lymphadenopathy:    She has no cervical adenopathy.  Skin: No rash noted.          Assessment & Plan:  Viral syndrome. Take over-the-counter anti-inflammatories as needed. No evidence for acute bacterial illness. Followup as needed

## 2011-07-23 ENCOUNTER — Other Ambulatory Visit: Payer: Self-pay | Admitting: Internal Medicine

## 2011-08-18 ENCOUNTER — Other Ambulatory Visit: Payer: Self-pay | Admitting: Internal Medicine

## 2011-08-31 ENCOUNTER — Ambulatory Visit (INDEPENDENT_AMBULATORY_CARE_PROVIDER_SITE_OTHER): Payer: Managed Care, Other (non HMO) | Admitting: Internal Medicine

## 2011-08-31 ENCOUNTER — Encounter: Payer: Self-pay | Admitting: Internal Medicine

## 2011-08-31 VITALS — BP 110/74 | Temp 98.2°F | Wt 170.0 lb

## 2011-08-31 DIAGNOSIS — L259 Unspecified contact dermatitis, unspecified cause: Secondary | ICD-10-CM

## 2011-08-31 DIAGNOSIS — F329 Major depressive disorder, single episode, unspecified: Secondary | ICD-10-CM

## 2011-08-31 MED ORDER — METHYLPREDNISOLONE ACETATE 80 MG/ML IJ SUSP
80.0000 mg | Freq: Once | INTRAMUSCULAR | Status: AC
  Administered 2011-08-31: 80 mg via INTRAMUSCULAR

## 2011-08-31 MED ORDER — VENLAFAXINE HCL ER 150 MG PO CP24: 150.0000 mg | ORAL_CAPSULE | Freq: Every day | ORAL | Status: DC

## 2011-08-31 MED ORDER — FLUTICASONE PROPIONATE 50 MCG/ACT NA SUSP: 2.0000 | Freq: Every day | NASAL | Status: DC

## 2011-08-31 MED ORDER — TRIAMTERENE-HCTZ 37.5-25 MG PO CAPS: 1.0000 | ORAL_CAPSULE | Freq: Every day | ORAL | Status: DC

## 2011-08-31 MED ORDER — VENLAFAXINE HCL ER 75 MG PO CP24: 75.0000 mg | ORAL_CAPSULE | Freq: Every day | ORAL | Status: DC

## 2011-08-31 MED ORDER — TRIAMCINOLONE ACETONIDE 0.1 % EX CREA: TOPICAL_CREAM | Freq: Two times a day (BID) | CUTANEOUS | Status: AC

## 2011-08-31 NOTE — Patient Instructions (Signed)
Call or return to clinic prn if these symptoms worsen or fail to improve as anticipated.

## 2011-08-31 NOTE — Progress Notes (Signed)
  Subjective:    Patient ID: Chelsea Dyer, female    DOB: May 19, 1966, 45 y.o.   MRN: [id number]  HPI  45 year old patient who presents with a chief complaint of a fairly generalized rash. She was doing outdoor work recently and has developed a pruritic dermatitis most marked on the extremities neck and trunk She has a history of depression which has been stable.    Review of Systems  Skin: Positive for rash.  Psychiatric/Behavioral: Positive for dysphoric mood.       Objective:   Physical Exam  Constitutional: She appears well-developed and well-nourished. No distress.  Skin: Rash noted.       Scattered areas of the erythema some areas as papular lesions and others larger plaques some with surrounding scaling          Assessment & Plan:   Contact dermatitis. Will treat with topical triamcinolone as well as Depo-Medrol Depression stable. Medications refilled

## 2011-09-22 ENCOUNTER — Encounter: Payer: Self-pay | Admitting: Family

## 2011-09-22 ENCOUNTER — Ambulatory Visit (INDEPENDENT_AMBULATORY_CARE_PROVIDER_SITE_OTHER): Payer: Managed Care, Other (non HMO) | Admitting: Family

## 2011-09-22 VITALS — BP 120/78 | Temp 98.1°F | Wt 170.0 lb

## 2011-09-22 DIAGNOSIS — R51 Headache: Secondary | ICD-10-CM

## 2011-09-22 DIAGNOSIS — J019 Acute sinusitis, unspecified: Secondary | ICD-10-CM

## 2011-09-22 MED ORDER — AMOXICILLIN 500 MG PO TABS: 1000.0000 mg | ORAL_TABLET | Freq: Two times a day (BID) | ORAL | Status: AC

## 2011-09-22 MED ORDER — TRAMADOL HCL 50 MG PO TABS: 50.0000 mg | ORAL_TABLET | Freq: Three times a day (TID) | ORAL | Status: AC | PRN

## 2011-09-22 NOTE — Progress Notes (Signed)
Subjective:    Patient ID: Chelsea Dyer, female    DOB: 09-10-1966, 46 y.o.   MRN: [id number]  HPI 46 year old white female, nonsmoker, patient of Dr. Kirtland Bouchard. is in with complaints of headaches that have been going on for about a week. She also had sinus drainage, sinus pressure and pain has been going on over the last several days. She also has pain in her teeth. She's been taking Advil with no relief. Reports an increase in fatigue. She also reports decreasing her caffeine intake from 6 cokes a day, to none over the last 4 days. She is drink 6 cokes a day for about one year.   Review of Systems  Constitutional: Negative.   HENT: Positive for congestion and postnasal drip.   Eyes: Negative.   Respiratory: Negative.   Cardiovascular: Negative.   Skin: Negative.   Neurological: Positive for headaches.  Hematological: Negative.   Psychiatric/Behavioral: Negative.    Past Medical History  Diagnosis Date  . 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 EXOGENOUS OBESITY 06/22/2010  . DEPRESSION 03/04/2008  . ALLERGIC RHINITIS 03/04/2008  . Prolactinoma   . Edema     pedal    History   Social History  . Marital Status: Married    Spouse Name: N/A    Number of Children: N/A  . Years of Education: N/A   Occupational History  . Not on file.   Social History Main Topics  . Smoking status: Never Smoker   . Smokeless tobacco: Not on file  . Alcohol Use:   . Drug Use:   . Sexually Active:    Other Topics Concern  . Not on file   Social History Narrative  . No narrative on file    Past Surgical History  Procedure Date  . Breast surgery 2004    augmentation  . Liposuction   . Nasal sinus surgery     Family History  Problem Relation Age of Onset  . Arthritis Mother     osteo    Allergies  Allergen Reactions  . Sulfamethoxazole W/Trimethoprim     Current Outpatient Prescriptions on File Prior to Visit  Medication Sig Dispense Refill  . bromocriptine (PARLODEL) 2.5 MG tablet  TAKE 1 TABLET BY MOUTH EVERY DAY  90 tablet  3  . chlorpheniramine-hydrocodone (TUSSIONEX) 8-10 MG/5ML suspension Take 5 mLs by mouth every 12 (twelve) hours as needed for cough.  60 mL  0  . fluticasone (FLONASE) 50 MCG/ACT nasal spray Place 2 sprays into the nose daily.  32 g  2  . guaiFENesin-codeine (ROBITUSSIN AC) 100-10 MG/5ML syrup Take 5 mLs by mouth at bedtime as needed.        . phentermine 37.5 MG capsule Take 1 capsule (37.5 mg total) by mouth every morning.  60 capsule  0  . triamcinolone cream (KENALOG) 0.1 % Apply topically 2 (two) times daily.  60 g  1  . triamterene-hydrochlorothiazide (DYAZIDE) 37.5-25 MG per capsule Take 1 each (1 capsule total) by mouth daily.  90 capsule  3  . venlafaxine (EFFEXOR-XR) 150 MG 24 hr capsule Take 1 capsule (150 mg total) by mouth daily.  90 capsule  4  . venlafaxine (EFFEXOR-XR) 75 MG 24 hr capsule Take 1 capsule (75 mg total) by mouth daily.  90 capsule  3    BP 120/78  Temp(Src) 98.1 F (36.7 C) (Oral)  Wt 170 lb (77.111 kg)chart    Objective:   Physical Exam  Constitutional: She is oriented to person, place,  and time. She appears well-developed and well-nourished.  HENT:  Right Ear: External ear normal.  Left Ear: External ear normal.  Nose: Nose normal.  Mouth/Throat: Oropharynx is clear and moist.  Neck: Normal range of motion. Neck supple.  Cardiovascular: Normal rate, regular rhythm and normal heart sounds.   Pulmonary/Chest: Effort normal and breath sounds normal.  Musculoskeletal: Normal range of motion.  Neurological: She is alert and oriented to person, place, and time.  Skin: Skin is warm and dry.  Psychiatric: She has a normal mood and affect.          Assessment & Plan:  Assessment: Headache, likely caffeine withdrawal, acute sinusitis  Plan: Encouraged her with over-the-counter Claritin and resume her Flonase 2 sprays any structural today. Amoxicillin 500 mg 2 capsules by mouth twice a day x10 days. Tramadol 50  mg one tablet every 8 hours when necessary pain to aid with her headache. Patient to call the office if symptoms worsen or persist. Recheck as scheduled and when necessary. Directions given on how to properly her caffeine intake in the future.

## 2011-09-22 NOTE — Patient Instructions (Addendum)
Sinusitis Sinuses are air pockets within the bones of your face. The growth of bacteria within a sinus leads to infection. The infection prevents the sinuses from draining. This infection is called sinusitis. SYMPTOMS  There will be different areas of pain depending on which sinuses have become infected.  The maxillary sinuses often produce pain beneath the eyes.   Frontal sinusitis may cause pain in the middle of the forehead and above the eyes.  Other problems (symptoms) include:  Toothaches.   Colored, pus-like (purulent) drainage from the nose.   Swelling, warmth, and tenderness over the sinus areas may be signs of infection.  TREATMENT  Sinusitis is most often determined by an exam.X-rays may be taken. If x-rays have been taken, make sure you obtain your results or find out how you are to obtain them. Your caregiver may give you medications (antibiotics). These are medications that will help kill the bacteria causing the infection. You may also be given a medication (decongestant) that helps to reduce sinus swelling.  HOME CARE INSTRUCTIONS   Only take over-the-counter or prescription medicines for pain, discomfort, or fever as directed by your caregiver.   Drink extra fluids. Fluids help thin the mucus so your sinuses can drain more easily.   Applying either moist heat or ice packs to the sinus areas may help relieve discomfort.   Use saline nasal sprays to help moisten your sinuses. The sprays can be found at your local drugstore.  SEEK IMMEDIATE MEDICAL CARE IF:  You have a fever.   You have increasing pain, severe headaches, or toothache.   You have nausea, vomiting, or drowsiness.   You develop unusual swelling around the face or trouble seeing.  MAKE SURE YOU:   Understand these instructions.   Will watch your condition.   Will get help right away if you are not doing well or get worse.  Document Released: 08/30/2005 Document Revised: 05/12/2011 Document Reviewed:  03/29/2007 [facility] Patient Information 2012 [place], [place].  Headache, General, Unknown Cause The specific cause of your headache may not have been found today. There are many causes and types of headache. A few common ones are:  Tension headache.   Migraine.   Infections (examples: dental and sinus infections).   Bone and/or joint problems in the neck or jaw.   Depression.   Eye problems.  These headaches are not life threatening.  Headaches can sometimes be diagnosed by a patient history and a physical exam. Sometimes, lab and imaging studies (such as x-ray and/or CT scan) are used to rule out more serious problems. In some cases, a spinal tap (lumbar puncture) may be requested. There are many times when your exam and tests may be normal on the first visit even when there is a serious problem causing your headaches. Because of that, it is very important to follow up with your doctor or local clinic for further evaluation. FINDING OUT THE RESULTS OF TESTS  If a radiology test was performed, a radiologist will review your results.   You will be contacted by the emergency department or your physician if any test results require a change in your treatment plan.   Not all test results may be available during your visit. If your test results are not back during the visit, make an appointment with your caregiver to find out the results. Do not assume everything is normal if you have not heard from your caregiver or the medical facility. It is important for you to follow up on  all of your test results.  HOME CARE INSTRUCTIONS   Keep follow-up appointments with your caregiver, or any specialist referral.   Only take over-the-counter or prescription medicines for pain, discomfort, or fever as directed by your caregiver.   Biofeedback, massage, or other relaxation techniques may be helpful.   Ice packs or heat applied to the head and neck can be used. Do this three to four times per day,  or as needed.   Call your doctor if you have any questions or concerns.   If you smoke, you should quit.  SEEK MEDICAL CARE IF:   You develop problems with medications prescribed.   You do not respond to or obtain relief from medications.   You have a change from the usual headache.   You develop nausea or vomiting.  SEEK IMMEDIATE MEDICAL CARE IF:   If your headache becomes severe.   You have an unexplained oral temperature above 102 F (38.9 C), or as your caregiver suggests.   You have a stiff neck.   You have loss of vision.   You have muscular weakness.   You have loss of muscular control.   You develop severe symptoms different from your first symptoms.   You start losing your balance or have trouble walking.   You feel faint or pass out.  MAKE SURE YOU:   Understand these instructions.   Will watch your condition.   Will get help right away if you are not doing well or get worse.  Document Released: 08/30/2005 Document Revised: 05/12/2011 Document Reviewed: 04/18/2008 [facility] Patient Information 2012 [place], [place].

## 2011-09-24 ENCOUNTER — Telehealth: Payer: Self-pay | Admitting: Internal Medicine

## 2011-09-24 MED ORDER — DOXYCYCLINE HYCLATE 100 MG PO TABS: 100.0000 mg | ORAL_TABLET | Freq: Two times a day (BID) | ORAL | Status: AC

## 2011-09-24 NOTE — Telephone Encounter (Signed)
Pt came in to see Chelsea Dyer a few days ago for Headaches,and pressure in face. Pt was put on Amoxicillin and Tramadol. Pt says that meds are not working and she is extremely nauseous, having really bad headaches and pressure in face. Pt is req a med for nause and different meds for the other symptoms. [place] is going out of [place] at 5 am. [facility] [address] [facility]ap today.

## 2011-09-24 NOTE — Telephone Encounter (Signed)
[facility]

## 2011-09-24 NOTE — Telephone Encounter (Signed)
Discontinue amoxicillin;  doxycycline 100 mg #20 one twice daily

## 2011-09-27 ENCOUNTER — Telehealth: Payer: Self-pay | Admitting: Internal Medicine

## 2011-09-27 MED ORDER — FLUCONAZOLE 150 MG PO TABS: 150.0000 mg | ORAL_TABLET | Freq: Once | ORAL | Status: AC

## 2011-09-27 NOTE — Telephone Encounter (Signed)
rx done

## 2011-09-27 NOTE — Telephone Encounter (Signed)
Diflucan 150  #1  One time dose

## 2011-09-27 NOTE — Telephone Encounter (Signed)
Pt has a yeast infection that she says is from being on doxycycline..wants to have something called in

## 2011-09-27 NOTE — Telephone Encounter (Signed)
Please advise 

## 2011-09-28 ENCOUNTER — Telehealth: Payer: Self-pay | Admitting: *Deleted

## 2011-09-28 NOTE — Telephone Encounter (Signed)
Notified pt. 

## 2011-09-28 NOTE — Telephone Encounter (Signed)
Ok to d/c.

## 2011-09-28 NOTE — Telephone Encounter (Signed)
Pt wants to know if she should continue the antibiotics as she is still having sinus symptoms.

## 2011-11-30 ENCOUNTER — Encounter: Payer: Self-pay | Admitting: Internal Medicine

## 2011-11-30 ENCOUNTER — Ambulatory Visit (INDEPENDENT_AMBULATORY_CARE_PROVIDER_SITE_OTHER): Payer: Managed Care, Other (non HMO) | Admitting: Internal Medicine

## 2011-11-30 VITALS — BP 96/62 | Temp 97.9°F | Wt 155.0 lb

## 2011-11-30 DIAGNOSIS — J309 Allergic rhinitis, unspecified: Secondary | ICD-10-CM

## 2011-11-30 DIAGNOSIS — J069 Acute upper respiratory infection, unspecified: Secondary | ICD-10-CM

## 2011-11-30 MED ORDER — METHYLPREDNISOLONE ACETATE 80 MG/ML IJ SUSP
80.0000 mg | Freq: Once | INTRAMUSCULAR | Status: AC
  Administered 2011-11-30: 80 mg via INTRAMUSCULAR

## 2011-11-30 NOTE — Patient Instructions (Signed)
It is important that you exercise regularly, at least 20 minutes 3 to 4 times per week.  If you develop chest pain or shortness of breath seek  medical attention.  Limit your sodium (Salt) intake  Please check your blood pressure on a regular basis.  If it is consistently greater than 150/90, please make an office appointment.  Consider trial off Dyazide

## 2011-11-30 NOTE — Progress Notes (Signed)
  Subjective:    Patient ID: Chelsea Dyer, female    DOB: 1966-09-11, 46 y.o.   MRN: [id number]  HPI  Wt Readings from Last 3 Encounters:  11/30/11 155 lb (70.308 kg)  09/22/11 170 lb (77.111 kg)  08/31/11 170 lb (77.111 kg)    Review of Systems     Objective:   Physical Exam        Assessment & Plan:

## 2011-11-30 NOTE — Progress Notes (Signed)
  Subjective:    Patient ID: Chelsea Dyer, female    DOB: Mar 16, 1966, 46 y.o.   MRN: [id number]  HPI  46 year old patient who is seen today for followup. She has a history of allergic rhinitis and for the past several days has had increasing sinus congestion itchy ears and minimal postnasal drip. She has had slight cough She has been followed at a metabolic clinic and has lost 15 pounds in the past 2 months. Denies any fever or productive cough fever or chills  BP Readings from Last 3 Encounters:  11/30/11 96/62  09/22/11 120/78  08/31/11 110/74   Review of Systems  Constitutional: Negative.   HENT: Negative for hearing loss, congestion, sore throat, rhinorrhea, dental problem, sinus pressure and tinnitus.   Eyes: Negative for pain, discharge and visual disturbance.  Respiratory: Negative for cough and shortness of breath.   Cardiovascular: Negative for chest pain, palpitations and leg swelling.  Gastrointestinal: Negative for nausea, vomiting, abdominal pain, diarrhea, constipation, blood in stool and abdominal distention.  Genitourinary: Negative for dysuria, urgency, frequency, hematuria, flank pain, vaginal bleeding, vaginal discharge, difficulty urinating, vaginal pain and pelvic pain.  Musculoskeletal: Negative for joint swelling, arthralgias and gait problem.  Skin: Negative for rash.  Neurological: Negative for dizziness, syncope, speech difficulty, weakness, numbness and headaches.  Hematological: Negative for adenopathy.  Psychiatric/Behavioral: Negative for behavioral problems, dysphoric mood and agitation. The patient is not nervous/anxious.        Objective:   Physical Exam  Constitutional: She is oriented to person, place, and time. She appears well-developed and well-nourished.  HENT:  Head: Normocephalic.  Right Ear: External ear normal.  Left Ear: External ear normal.  Mouth/Throat: Oropharynx is clear and moist.  Eyes: Conjunctivae and EOM are normal. Pupils are  equal, round, and reactive to light.  Neck: Normal range of motion. Neck supple. No thyromegaly present.  Cardiovascular: Normal rate, regular rhythm, normal heart sounds and intact distal pulses.   Pulmonary/Chest: Effort normal and breath sounds normal.  Abdominal: Soft. Bowel sounds are normal. She exhibits no mass. There is no tenderness.  Musculoskeletal: Normal range of motion.  Lymphadenopathy:    She has no cervical adenopathy.  Neurological: She is alert and oriented to person, place, and time.  Skin: Skin is warm and dry. No rash noted.  Psychiatric: She has a normal mood and affect. Her behavior is normal.          Assessment  & Plan:    Flair allergic rhinitis Weight loss History depression stable  The patient will discontinue diuretic therapy  We'll treat with Depo-Medrol 80 mg

## 2011-12-22 ENCOUNTER — Other Ambulatory Visit: Payer: Self-pay | Admitting: Internal Medicine

## 2012-05-05 ENCOUNTER — Other Ambulatory Visit: Payer: Self-pay | Admitting: Internal Medicine

## 2012-05-05 DIAGNOSIS — Z1231 Encounter for screening mammogram for malignant neoplasm of breast: Secondary | ICD-10-CM

## 2012-05-09 ENCOUNTER — Ambulatory Visit: Payer: Managed Care, Other (non HMO) | Admitting: Internal Medicine

## 2012-05-18 ENCOUNTER — Other Ambulatory Visit (HOSPITAL_COMMUNITY)
Admission: RE | Admit: 2012-05-18 | Discharge: 2012-05-18 | Disposition: A | Discharge status: Home / Self Care | Payer: Managed Care, Other (non HMO) | Source: Ambulatory Visit | Attending: Internal Medicine | Admitting: Internal Medicine

## 2012-05-18 ENCOUNTER — Encounter: Payer: Self-pay | Admitting: Internal Medicine

## 2012-05-18 ENCOUNTER — Ambulatory Visit (INDEPENDENT_AMBULATORY_CARE_PROVIDER_SITE_OTHER): Payer: Managed Care, Other (non HMO) | Admitting: Internal Medicine

## 2012-05-18 VITALS — BP 100/70 | HR 72 | Temp 97.8°F | Resp 20 | Ht 62.0 in | Wt 158.0 lb

## 2012-05-18 DIAGNOSIS — Z0000 Encounter for general adult medical examination without abnormal findings: Secondary | ICD-10-CM

## 2012-05-18 DIAGNOSIS — D352 Benign neoplasm of pituitary gland: Secondary | ICD-10-CM

## 2012-05-18 DIAGNOSIS — Z01419 Encounter for gynecological examination (general) (routine) without abnormal findings: Secondary | ICD-10-CM | POA: Insufficient documentation

## 2012-05-18 DIAGNOSIS — D353 Benign neoplasm of craniopharyngeal duct: Secondary | ICD-10-CM

## 2012-05-18 LAB — LIPID PANEL
Cholesterol: 183 mg/dL (ref 0–200)
HDL: 64.6 mg/dL (ref >39.00)
Triglycerides: 75 mg/dL (ref 0.0–149.0)
VLDL: 15 mg/dL (ref 0.0–40.0)

## 2012-05-18 LAB — CBC WITH DIFFERENTIAL/PLATELET
Basophils Absolute: 0 10*3/uL (ref 0.0–0.1)
Eosinophils Absolute: 0.5 10*3/uL (ref 0.0–0.7)
MCHC: 32.9 g/dL (ref 30.0–36.0)
MCV: 93 fl (ref 78.0–100.0)
Monocytes Absolute: 0.5 10*3/uL (ref 0.1–1.0)
Neutrophils Relative %: 49.7 % (ref 43.0–77.0)
Platelets: 255 10*3/uL (ref 150.0–400.0)
WBC: 5.2 10*3/uL (ref 4.5–10.5)

## 2012-05-18 LAB — COMPREHENSIVE METABOLIC PANEL
Alkaline Phosphatase: 49 U/L (ref 39–117)
BUN: 15 mg/dL (ref 6–23)
CO2: 27 mEq/L (ref 19–32)
Creatinine, Ser: 0.7 mg/dL (ref 0.4–1.2)
GFR: 102.49 mL/min (ref >60.00)
Glucose, Bld: 68 mg/dL — ABNORMAL LOW (ref 70–99)
Sodium: 139 mEq/L (ref 135–145)
Total Bilirubin: 0.7 mg/dL (ref 0.3–1.2)
Total Protein: 6.7 g/dL (ref 6.0–8.3)

## 2012-05-18 LAB — TSH: TSH: 1.66 u[IU]/mL (ref 0.35–5.50)

## 2012-05-18 MED ORDER — TRIAMTERENE-HCTZ 37.5-25 MG PO CAPS: 1.0000 | ORAL_CAPSULE | Freq: Every day | ORAL | Status: DC

## 2012-05-18 MED ORDER — VENLAFAXINE HCL ER 75 MG PO CP24: 75.0000 mg | ORAL_CAPSULE | Freq: Every day | ORAL | Status: DC

## 2012-05-18 MED ORDER — PHENTERMINE HCL 37.5 MG PO CAPS: 37.5000 mg | ORAL_CAPSULE | ORAL | Status: DC

## 2012-05-18 MED ORDER — BROMOCRIPTINE MESYLATE 2.5 MG PO TABS: 2.5000 mg | ORAL_TABLET | Freq: Every day | ORAL | Status: DC

## 2012-05-18 MED ORDER — FLUTICASONE PROPIONATE 50 MCG/ACT NA SUSP: 2.0000 | Freq: Every day | NASAL | Status: DC

## 2012-05-18 MED ORDER — VENLAFAXINE HCL ER 150 MG PO CP24: 150.0000 mg | ORAL_CAPSULE | Freq: Every day | ORAL | Status: DC

## 2012-05-18 NOTE — Progress Notes (Signed)
Subjective:    Patient ID: Chelsea Dyer, female    DOB: 1966-01-02, 46 y.o.   MRN: [id number]  HPI  46 year old patient who is seen today for a health maintenance examination. She has a greater than 10 year history of a pituitary microadenoma. She has been on bromocriptine therapy. She stopped this medication recently and did note some rest tenderness which is improving with resuming her medication. She has a history of breast implants and is scheduled for a followup mammogram next week. She is doing quite well she has a history of depression and mild the exogenous obesity. No new concerns or complaints  Past Medical History  Diagnosis Date  . 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 EXOGENOUS OBESITY 06/22/2010  . DEPRESSION 03/04/2008  . ALLERGIC RHINITIS 03/04/2008  . Prolactinoma   . Edema     pedal    History   Social History  . Marital Status: Married    Spouse Name: N/A    Number of Children: N/A  . Years of Education: N/A   Occupational History  . Not on file.   Social History Main Topics  . Smoking status: Never Smoker   . Smokeless tobacco: Never Used  . Alcohol Use: No  . Drug Use: No  . Sexually Active: Not on file   Other Topics Concern  . Not on file   Social History Narrative  . No narrative on file    Past Surgical History  Procedure Date  . Breast surgery 2004    augmentation  . Liposuction   . Nasal sinus surgery     Family History  Problem Relation Age of Onset  . Arthritis Mother     osteo    Allergies  Allergen Reactions  . Sulfamethoxazole W-Trimethoprim     Current Outpatient Prescriptions on File Prior to Visit  Medication Sig Dispense Refill  . bromocriptine (PARLODEL) 2.5 MG tablet TAKE 1 TABLET BY MOUTH EVERY DAY  90 tablet  2  . chlorpheniramine-hydrocodone (TUSSIONEX) 8-10 MG/5ML suspension Take 5 mLs by mouth every 12 (twelve) hours as needed for cough.  60 mL  0  . fluticasone (FLONASE) 50 MCG/ACT nasal spray Place 2 sprays  into the nose daily.  32 g  2  . guaiFENesin-codeine (ROBITUSSIN AC) 100-10 MG/5ML syrup Take 5 mLs by mouth at bedtime as needed.        . phentermine 37.5 MG capsule Take 1 capsule (37.5 mg total) by mouth every morning.  60 capsule  0  . triamcinolone cream (KENALOG) 0.1 % Apply topically 2 (two) times daily.  60 g  1  . triamterene-hydrochlorothiazide (DYAZIDE) 37.5-25 MG per capsule Take 1 each (1 capsule total) by mouth daily.  90 capsule  3  . venlafaxine (EFFEXOR-XR) 150 MG 24 hr capsule Take 1 capsule (150 mg total) by mouth daily.  90 capsule  4  . venlafaxine (EFFEXOR-XR) 150 MG 24 hr capsule TAKE ONE CAPSULE EVERY DAY  90 capsule  2  . venlafaxine (EFFEXOR-XR) 75 MG 24 hr capsule Take 1 capsule (75 mg total) by mouth daily.  90 capsule  3    BP 100/70  Pulse 72  Temp 97.8 F (36.6 C) (Oral)  Resp 20  Ht 5\' 2"  (1.575 m)  Wt 158 lb (71.668 kg)  BMI 28.90 kg/m2  SpO2 97%   Current Allergies:  ! SEPTRA DS (SULFAMETHOXAZOLE-TRIMETHOPRIM)   Past Medical History:  Reviewed history and no changes required:  prolactinoma  Allergic rhinitis  Depression  pedal  edema   Past Surgical History:  breast augmentation 2004  history of liposuction  Sinus surgery   Family History:  Reviewed history and no changes required:  no details of her father's health unknown  mother, age 57, history of osteoarthritis, otherwise in excellent health h/o CVA One brother two sisters in good health   Social History:  Reviewed history and no changes required:  Divorced  Never Smoked  has a daughter, with Down syndrome, who she shares custody;  ex-husband lives in [place]      Review of Systems  Constitutional: Negative.   HENT: Negative for hearing loss, congestion, sore throat, rhinorrhea, dental problem, sinus pressure and tinnitus.   Eyes: Negative for pain, discharge and visual disturbance.  Respiratory: Negative for cough and shortness of breath.   Cardiovascular: Negative for  chest pain, palpitations and leg swelling.  Gastrointestinal: Negative for nausea, vomiting, abdominal pain, diarrhea, constipation, blood in stool and abdominal distention.  Genitourinary: Negative for dysuria, urgency, frequency, hematuria, flank pain, vaginal bleeding, vaginal discharge, difficulty urinating, vaginal pain and pelvic pain.  Musculoskeletal: Negative for joint swelling, arthralgias and gait problem.  Skin: Negative for rash.  Neurological: Negative for dizziness, syncope, speech difficulty, weakness, numbness and headaches.  Hematological: Negative for adenopathy.  Psychiatric/Behavioral: Negative for behavioral problems, dysphoric mood and agitation. The patient is not nervous/anxious.        Objective:   Physical Exam  Constitutional: She is oriented to person, place, and time. She appears well-developed and well-nourished.  HENT:  Head: Normocephalic and atraumatic.  Right Ear: External ear normal.  Left Ear: External ear normal.  Mouth/Throat: Oropharynx is clear and moist.  Eyes: Conjunctivae and EOM are normal.  Neck: Normal range of motion. Neck supple. No JVD present. No thyromegaly present.  Cardiovascular: Normal rate, regular rhythm, normal heart sounds and intact distal pulses.   No murmur heard. Pulmonary/Chest: Effort normal and breath sounds normal. She has no wheezes. She has no rales.  Abdominal: Soft. Bowel sounds are normal. She exhibits no distension and no mass. There is no tenderness. There is no rebound and no guarding.  Genitourinary: Vagina normal. Guaiac negative stool.  Musculoskeletal: Normal range of motion. She exhibits no edema and no tenderness.  Neurological: She is alert and oriented to person, place, and time. She has normal reflexes. No cranial nerve deficit. She exhibits normal muscle tone. Coordination normal.  Skin: Skin is warm and dry. No rash noted.  Psychiatric: She has a normal mood and affect. Her behavior is normal.           Assessment & Plan:    Preventive health exam Pituitary microadenoma. Will check a prolactin level. Continue present regimen compliance stressed Depression stable we'll refill it Effexor  Recheck in one year or as needed

## 2012-05-18 NOTE — Patient Instructions (Signed)
Limit your sodium (Salt) intake    It is important that you exercise regularly, at least 20 minutes 3 to 4 times per week.  If you develop chest pain or shortness of breath seek  medical attention.  Return in one year for follow-up   

## 2012-05-19 LAB — PROLACTIN: Prolactin: 25.3 ng/mL

## 2012-05-22 NOTE — Progress Notes (Signed)
Quick Note:  Attempt to call pt- VM - LMTCB if questions - labs and pap normal ______

## 2012-06-09 ENCOUNTER — Ambulatory Visit
Admission: RE | Admit: 2012-06-09 | Discharge: 2012-06-09 | Disposition: A | Discharge status: Home / Self Care | Payer: Managed Care, Other (non HMO) | Source: Ambulatory Visit | Attending: Internal Medicine | Admitting: Internal Medicine

## 2012-06-09 DIAGNOSIS — Z1231 Encounter for screening mammogram for malignant neoplasm of breast: Secondary | ICD-10-CM

## 2012-06-26 ENCOUNTER — Encounter: Payer: Self-pay | Admitting: Internal Medicine

## 2012-06-26 ENCOUNTER — Ambulatory Visit (INDEPENDENT_AMBULATORY_CARE_PROVIDER_SITE_OTHER): Payer: Managed Care, Other (non HMO) | Admitting: Internal Medicine

## 2012-06-26 VITALS — BP 100/70 | Temp 98.1°F | Wt 155.0 lb

## 2012-06-26 DIAGNOSIS — J069 Acute upper respiratory infection, unspecified: Secondary | ICD-10-CM

## 2012-06-26 DIAGNOSIS — J309 Allergic rhinitis, unspecified: Secondary | ICD-10-CM

## 2012-06-26 MED ORDER — HYDROCODONE-HOMATROPINE 5-1.5 MG/5ML PO SYRP: 5.0000 mL | ORAL_SOLUTION | Freq: Four times a day (QID) | ORAL | Status: DC | PRN

## 2012-06-26 NOTE — Progress Notes (Signed)
Subjective:    Patient ID: Chelsea Dyer, female    DOB: 1966/09/13, 46 y.o.   MRN: [id number]  HPI  46 year old patient who has been ill for about 7 days. She was seen at an urgent care in [place] and has just completed 7 days of Augmentin. She apparently was treated on day 1 of her symptoms. Today she states that she is still on well with cough and sinus congestion rhinorrhea sore throat and generalized achiness. She has a daughter who was hospitalized for 3 days for pneumonia recently. She is concerned about a community-acquired pneumonia she has allergic rhinitis and has been using Allegra and fluticasone nasal spray Past Medical History  Diagnosis Date  . 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 EXOGENOUS OBESITY 06/22/2010  . DEPRESSION 03/04/2008  . ALLERGIC RHINITIS 03/04/2008  . Prolactinoma   . Edema     pedal    History   Social History  . Marital Status: Married    Spouse Name: N/A    Number of Children: N/A  . Years of Education: N/A   Occupational History  . Not on file.   Social History Main Topics  . Smoking status: Never Smoker   . Smokeless tobacco: Never Used  . Alcohol Use: No  . Drug Use: No  . Sexually Active: Not on file   Other Topics Concern  . Not on file   Social History Narrative  . No narrative on file    Past Surgical History  Procedure Date  . Breast surgery 2004    augmentation  . Liposuction   . Nasal sinus surgery     Family History  Problem Relation Age of Onset  . Arthritis Mother     osteo    Allergies  Allergen Reactions  . Sulfamethoxazole W-Trimethoprim     Current Outpatient Prescriptions on File Prior to Visit  Medication Sig Dispense Refill  . bromocriptine (PARLODEL) 2.5 MG tablet Take 1 tablet (2.5 mg total) by mouth daily.  90 tablet  2  . fluticasone (FLONASE) 50 MCG/ACT nasal spray Place 2 sprays into the nose daily.  32 g  2  . guaiFENesin-codeine (ROBITUSSIN AC) 100-10 MG/5ML syrup Take 5 mLs by mouth at bedtime as  needed.        . phentermine 37.5 MG capsule Take 1 capsule (37.5 mg total) by mouth every morning.  60 capsule  0  . triamcinolone cream (KENALOG) 0.1 % Apply topically 2 (two) times daily.  60 g  1  . triamterene-hydrochlorothiazide (DYAZIDE) 37.5-25 MG per capsule Take 1 each (1 capsule total) by mouth daily.  90 capsule  3  . venlafaxine XR (EFFEXOR-XR) 150 MG 24 hr capsule Take 1 capsule (150 mg total) by mouth daily.  90 capsule  4  . venlafaxine XR (EFFEXOR-XR) 75 MG 24 hr capsule Take 1 capsule (75 mg total) by mouth daily.  90 capsule  3    BP 100/70  Temp 98.1 F (36.7 C) (Oral)  Wt 155 lb (70.308 kg)  LMP 06/26/2012       Review of Systems  Constitutional: Positive for activity change, appetite change and fatigue.  HENT: Positive for congestion and postnasal drip. Negative for hearing loss, sore throat, rhinorrhea, dental problem, sinus pressure and tinnitus.   Eyes: Negative for pain, discharge and visual disturbance.  Respiratory: Positive for cough. Negative for shortness of breath.   Cardiovascular: Negative for chest pain, palpitations and leg swelling.  Gastrointestinal: Negative for nausea, vomiting, abdominal pain, diarrhea, constipation,  blood in stool and abdominal distention.  Genitourinary: Negative for dysuria, urgency, frequency, hematuria, flank pain, vaginal bleeding, vaginal discharge, difficulty urinating, vaginal pain and pelvic pain.  Musculoskeletal: Negative for joint swelling, arthralgias and gait problem.  Skin: Negative for rash.  Neurological: Negative for dizziness, syncope, speech difficulty, weakness, numbness and headaches.  Hematological: Negative for adenopathy.  Psychiatric/Behavioral: Negative for behavioral problems, dysphoric mood and agitation. The patient is not nervous/anxious.        Objective:   Physical Exam  Constitutional: She is oriented to person, place, and time. She appears well-developed and well-nourished.  HENT:    Head: Normocephalic.  Right Ear: External ear normal.  Left Ear: External ear normal.  Mouth/Throat: Oropharynx is clear and moist.  Eyes: Conjunctivae normal and EOM are normal. Pupils are equal, round, and reactive to light.  Neck: Normal range of motion. Neck supple. No thyromegaly present.  Cardiovascular: Normal rate, regular rhythm, normal heart sounds and intact distal pulses.   Pulmonary/Chest: Effort normal and breath sounds normal.  Abdominal: Soft. Bowel sounds are normal. She exhibits no mass. There is no tenderness.  Musculoskeletal: Normal range of motion.  Lymphadenopathy:    She has no cervical adenopathy.  Neurological: She is alert and oriented to person, place, and time.  Skin: Skin is warm and dry. No rash noted.  Psychiatric: She has a normal mood and affect. Her behavior is normal.          Assessment & Plan:  Viral URI with cough. We'll treat symptomatically Allergic rhinitis

## 2012-06-26 NOTE — Patient Instructions (Addendum)
Acute sinusitis symptoms for less than 10 days are generally not helped by antibiotic therapy.  Use saline irrigation, warm  moist compresses and over-the-counter decongestants only as directed.  Call if there is no improvement in 5 to 7 days, or sooner if you develop increasing pain, fever, or any new symptoms. 

## 2012-06-28 ENCOUNTER — Telehealth: Payer: Self-pay | Admitting: Internal Medicine

## 2012-06-28 ENCOUNTER — Encounter: Payer: Self-pay | Admitting: Internal Medicine

## 2012-06-28 ENCOUNTER — Ambulatory Visit (INDEPENDENT_AMBULATORY_CARE_PROVIDER_SITE_OTHER): Payer: Managed Care, Other (non HMO) | Admitting: Internal Medicine

## 2012-06-28 VITALS — BP 90/60 | Temp 98.1°F | Wt 156.0 lb

## 2012-06-28 DIAGNOSIS — F329 Major depressive disorder, single episode, unspecified: Secondary | ICD-10-CM

## 2012-06-28 DIAGNOSIS — J069 Acute upper respiratory infection, unspecified: Secondary | ICD-10-CM

## 2012-06-28 DIAGNOSIS — J309 Allergic rhinitis, unspecified: Secondary | ICD-10-CM

## 2012-06-28 MED ORDER — HYDROCOD POLST-CHLORPHEN POLST 10-8 MG/5ML PO LQCR: 5.0000 mL | Freq: Two times a day (BID) | ORAL | Status: DC

## 2012-06-28 MED ORDER — AZITHROMYCIN 250 MG PO TABS: ORAL_TABLET | ORAL | Status: DC

## 2012-06-28 NOTE — Telephone Encounter (Signed)
Caller: [name]/Patient; Patient Name: Chelsea Dyer; PCP: Eleonore Chiquito [facility]); Best Callback Phone Number: [phone]; Reason for call: Cough/Congestion.  States the cough syrup is not helpful.  Onset of achiness, cough 06/19/12.  Seen in office 06/26/12 but states has not improved.  States feels more weak and tired than when seen.  Cough is preventing her from sleeping.  Afebrile.  States she has mild chest pressure when coughing.  Per cough protocol, emergent symptoms denied; appt scheduled for See Within 24 Hours disposition 06/28/12 1015 with Dr. Amador Cunas.

## 2012-06-28 NOTE — Patient Instructions (Signed)
Use saline irrigation, warm  moist compresses and over-the-counter decongestants only as directed.  Call if there is no improvement in 5 to 7 days, or sooner if you develop increasing pain, fever, or any new symptoms. 

## 2012-06-28 NOTE — Progress Notes (Signed)
Subjective:    Patient ID: Chelsea Dyer, female    DOB: August 23, 1966, 47 y.o.   MRN: [id number]  HPI  46 year old patient who was seen 2 days ago and treated symptomatically for a URI. She was seen at an urgent care in [place] and had completed 7 days of Augmentin. Her daughter has been similarly ill and required a brief hospital stay for pneumonia. Her daughter continues to improve. The patient states that she has developed worsening cough it is now mildly productive. She has felt unwell with worsening weakness fatigue and achiness. She slept very little last night due 2 refractory cough in spite of antitussives medication  Past Medical History  Diagnosis Date  . 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 EXOGENOUS OBESITY 06/22/2010  . DEPRESSION 03/04/2008  . ALLERGIC RHINITIS 03/04/2008  . Prolactinoma   . Edema     pedal    History   Social History  . Marital Status: Married    Spouse Name: N/A    Number of Children: N/A  . Years of Education: N/A   Occupational History  . Not on file.   Social History Main Topics  . Smoking status: Never Smoker   . Smokeless tobacco: Never Used  . Alcohol Use: No  . Drug Use: No  . Sexually Active: Not on file   Other Topics Concern  . Not on file   Social History Narrative  . No narrative on file    Past Surgical History  Procedure Date  . Breast surgery 2004    augmentation  . Liposuction   . Nasal sinus surgery     Family History  Problem Relation Age of Onset  . Arthritis Mother     osteo    Allergies  Allergen Reactions  . Sulfamethoxazole W-Trimethoprim     Current Outpatient Prescriptions on File Prior to Visit  Medication Sig Dispense Refill  . bromocriptine (PARLODEL) 2.5 MG tablet Take 1 tablet (2.5 mg total) by mouth daily.  90 tablet  2  . fluticasone (FLONASE) 50 MCG/ACT nasal spray Place 2 sprays into the nose daily.  32 g  2  . guaiFENesin-codeine (ROBITUSSIN AC) 100-10 MG/5ML syrup Take 5 mLs by mouth at  bedtime as needed.        Marland Kitchen HYDROcodone-homatropine (HYCODAN) 5-1.5 MG/5ML syrup Take 5 mLs by mouth every 6 (six) hours as needed for cough.  120 mL  0  . phentermine 37.5 MG capsule Take 1 capsule (37.5 mg total) by mouth every morning.  60 capsule  0  . triamcinolone cream (KENALOG) 0.1 % Apply topically 2 (two) times daily.  60 g  1  . triamterene-hydrochlorothiazide (DYAZIDE) 37.5-25 MG per capsule Take 1 each (1 capsule total) by mouth daily.  90 capsule  3  . venlafaxine XR (EFFEXOR-XR) 150 MG 24 hr capsule Take 1 capsule (150 mg total) by mouth daily.  90 capsule  4  . venlafaxine XR (EFFEXOR-XR) 75 MG 24 hr capsule Take 1 capsule (75 mg total) by mouth daily.  90 capsule  3    BP 90/60  Temp 98.1 F (36.7 C) (Oral)  Wt 156 lb (70.761 kg)  LMP 06/26/2012        Review of Systems  Constitutional: Positive for fatigue.  HENT: Positive for congestion. Negative for hearing loss, sore throat, rhinorrhea, dental problem, sinus pressure and tinnitus.   Eyes: Negative for pain, discharge and visual disturbance.  Respiratory: Positive for cough. Negative for shortness of breath.   Cardiovascular: Negative  for chest pain, palpitations and leg swelling.  Gastrointestinal: Negative for nausea, vomiting, abdominal pain, diarrhea, constipation, blood in stool and abdominal distention.  Genitourinary: Negative for dysuria, urgency, frequency, hematuria, flank pain, vaginal bleeding, vaginal discharge, difficulty urinating, vaginal pain and pelvic pain.  Musculoskeletal: Positive for arthralgias. Negative for joint swelling and gait problem.  Skin: Negative for rash.  Neurological: Positive for weakness. Negative for dizziness, syncope, speech difficulty, numbness and headaches.  Hematological: Negative for adenopathy.  Psychiatric/Behavioral: Negative for behavioral problems, dysphoric mood and agitation. The patient is not nervous/anxious.        Objective:   Physical Exam    Constitutional: She is oriented to person, place, and time. She appears well-developed and well-nourished.  HENT:  Head: Normocephalic.  Right Ear: External ear normal.  Left Ear: External ear normal.  Mouth/Throat: Oropharynx is clear and moist.  Eyes: Conjunctivae normal and EOM are normal. Pupils are equal, round, and reactive to light.  Neck: Normal range of motion. Neck supple. No thyromegaly present.  Cardiovascular: Normal rate, regular rhythm, normal heart sounds and intact distal pulses.   Pulmonary/Chest: Effort normal and breath sounds normal.  Abdominal: Soft. Bowel sounds are normal. She exhibits no mass. There is no tenderness.  Musculoskeletal: Normal range of motion.  Lymphadenopathy:    She has no cervical adenopathy.  Neurological: She is alert and oriented to person, place, and time.  Skin: Skin is warm and dry. No rash noted.  Psychiatric: She has a normal mood and affect. Her behavior is normal.          Assessment & Plan:   URI/ bronchitis. Will treat with Tussionex and azithromycin Allergic rhinitis

## 2012-09-07 ENCOUNTER — Telehealth: Payer: Self-pay | Admitting: Internal Medicine

## 2012-09-07 NOTE — Telephone Encounter (Signed)
Patient Information:  Caller Name: [name]  Phone: [phone]  Patient: Chelsea Dyer, Chelsea Dyer  Gender: Female  DOB: [birth year]  Age: 46 Years  PCP: Eleonore Chiquito [facility])  Pregnant: No  Office Follow Up:  Does the office need to follow up with this patient?: No  Instructions For The Office: N/A  RN Note:  She cannot make an appt until Saturday.  Home care instructions and call back parameters reviewed. understanding expressed.  Symptoms  Reason For Call & Symptoms: Onset of Abdominal discomfort "Upper/ whole abdomen". Onset Tuesday , 09/05/12. she thought she might be constipated and took Dulcolax on Tuesday and Wednesday with no results. Last [facility] Tuesday 09/05/12- Normal.   Abdomen is slight hard to the touch,  feels bloated, no tenderness. No vomiting , Afebrille Intermittent "shocks". Pain with standing straight and walking yesterday but can stand today and is at work.  Reviewed Health History In EMR: Yes  Reviewed Medications In EMR: Yes  Reviewed Allergies In EMR: Yes  Reviewed Surgeries / Procedures: No  Date of Onset of Symptoms: 09/05/2012  Treatments Tried: Dulcolax,  Treatments Tried Worked: No OB / GYN:  LMP: 07/21/2012  Guideline(s) Used:  Abdominal Pain - Female  Abdominal Pain - Upper  Constipation  Disposition Per Guideline:   See Within 3 Days in Office  Reason For Disposition Reached:   Unable to have a bowel movement (BM) without using a laxative, suppository, or enema  Advice Given:  Reassurance:  A mild stomachache can be from indigestion, stomach irritation, or overeating. Sometimes a stomachache signals the onset of a vomiting illness from a viral infection.  Here is some care advice that should help.  Fluids:   Sip clear fluids only (e.g., water, flat soft drinks, or half-strength fruit juice) until the pain is gone for 2 hours. Then slowly return to a regular diet.  Diet:  Slowly advance diet from clear liquids to a bland diet.  Avoid  alcohol or caffeinated beverages.  Avoid greasy or fatty foods.  Call Back If:  Abdominal pain is constant and present for more than 2 hours.  You become worse.  General Constipation Instructions:  Eat a high fiber diet.  Drink adequate liquids.  Exercise regularly (even a daily 15 minute walk!).  Get into a rhythm - try to have a BM at the same time each day.  Don't ignore your body's signals to have a BM.  Avoid enemas and stimulant laxatives.  High Fiber Diet:  Try to eat fresh fruit and vegetables at each meal (peas, prunes, citrus, apples, beans, corn).  Eat more grain foods (bran flakes, bran muffins, graham crackers, oatmeal, brown rice, and whole wheat bread). Popcorn is a source of fiber.  Liquids:  Drink 6-8 glasses of water a day (Caution: certain medical conditions require fluid restriction).  Prune juice is a natural laxative.  Bulk Laxatives:  Metamucil (psyllium fiber): One teaspoon (5 cc) in a glass of water twice daily.  Osmotic Laxatives:  Miralax (polyethylene glycol 3350): Miralax is an "osmotic" agent which means that it binds water and causes water to be retained within the stool. You can use this laxative to treat occasional constipation. Do not use for more than 2 weeks without approval from your doctor. Generally, Miralax produces a bowel movement in 1 to 3 days. Side effects include diarrhea (especially at higher doses). If you are pregnant, discuss with your doctor before using. Available in the [place].  Call Back If:  Constipation continues (  i.e., less than 3 BMs / week or straining more than 25% of the time) after following care advice for constipation for 2 weeks  You become worse  Patient Refused Recommendation:  Patient Will Follow Up With Office Later  Patient cannot make appt

## 2012-09-21 ENCOUNTER — Other Ambulatory Visit: Payer: Self-pay | Admitting: Internal Medicine

## 2012-11-17 ENCOUNTER — Other Ambulatory Visit: Payer: Self-pay | Admitting: Internal Medicine

## 2012-12-15 ENCOUNTER — Other Ambulatory Visit: Payer: Self-pay | Admitting: Internal Medicine

## 2013-01-29 ENCOUNTER — Telehealth: Payer: Self-pay | Admitting: Internal Medicine

## 2013-01-29 NOTE — Telephone Encounter (Signed)
Dr. Alvera Novel

## 2013-01-29 NOTE — Telephone Encounter (Signed)
Pt would like to know if you have a preference for a chiropractor that she could go see. Pt is having some back issues and would like to pursue this type of MD.

## 2013-01-30 NOTE — Telephone Encounter (Signed)
Spoke to pt told her Dr. Kirtland Bouchard recommends Dr. Alvera Novel. Pt verbalized understanding.

## 2013-02-21 ENCOUNTER — Encounter: Payer: Self-pay | Admitting: Internal Medicine

## 2013-02-21 ENCOUNTER — Ambulatory Visit (INDEPENDENT_AMBULATORY_CARE_PROVIDER_SITE_OTHER): Payer: Managed Care, Other (non HMO) | Admitting: Internal Medicine

## 2013-02-21 VITALS — BP 106/70 | HR 74 | Temp 98.5°F | Resp 18 | Wt 164.0 lb

## 2013-02-21 DIAGNOSIS — J309 Allergic rhinitis, unspecified: Secondary | ICD-10-CM

## 2013-02-21 MED ORDER — AZELASTINE-FLUTICASONE 137-50 MCG/ACT NA SUSP: 2.0000 | Freq: Two times a day (BID) | NASAL | Status: DC

## 2013-02-21 MED ORDER — PREDNISONE 20 MG PO TABS: 20.0000 mg | ORAL_TABLET | Freq: Two times a day (BID) | ORAL | Status: DC

## 2013-02-21 NOTE — Progress Notes (Signed)
Subjective:    Patient ID: Chelsea Dyer, female    DOB: December 04, 1965, 47 y.o.   MRN: [id number]  HPI  47 year old patient who has a history of allergic rhinitis. She also has a prior history of sinus surgery. She works in [place] and is seen at this office in frequently. She states that she has been treated at urgent care centers 4 times this year for acute sinusitis. She has had 4 rounds of antibiotics. For the past several days she has had increased cough nasal congestion facial congestion and fullness;  she also describes some mild dental pain. She describes some mild postnasal drip and rhinorrhea. She states secretions are beginning to turn slightly green. No fever.  Past Medical History  Diagnosis Date  . 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 EXOGENOUS OBESITY 06/22/2010  . DEPRESSION 03/04/2008  . ALLERGIC RHINITIS 03/04/2008  . Prolactinoma   . Edema     pedal    History   Social History  . Marital Status: Married    Spouse Name: N/A    Number of Children: N/A  . Years of Education: N/A   Occupational History  . Not on file.   Social History Main Topics  . Smoking status: Never Smoker   . Smokeless tobacco: Never Used  . Alcohol Use: No  . Drug Use: No  . Sexually Active: Not on file   Other Topics Concern  . Not on file   Social History Narrative  . No narrative on file    Past Surgical History  Procedure Laterality Date  . Breast surgery  2004    augmentation  . Liposuction    . Nasal sinus surgery      Family History  Problem Relation Age of Onset  . Arthritis Mother     osteo    Allergies  Allergen Reactions  . Sulfamethoxazole W-Trimethoprim     Current Outpatient Prescriptions on File Prior to Visit  Medication Sig Dispense Refill  . bromocriptine (PARLODEL) 2.5 MG tablet Take 1 tablet (2.5 mg total) by mouth daily.  90 tablet  2  . chlorpheniramine-HYDROcodone (TUSSIONEX) 10-8 MG/5ML LQCR Take 5 mLs by mouth every 12 (twelve) hours.  115 mL  0   . fluticasone (FLONASE) 50 MCG/ACT nasal spray USE 2 SPRAYS INTO THE NOSE DAILY.  32 g  2  . phentermine 37.5 MG capsule Take 1 capsule (37.5 mg total) by mouth every morning.  60 capsule  0  . venlafaxine XR (EFFEXOR-XR) 150 MG 24 hr capsule TAKE 1 CAPSULE BY MOUTH ONCE DAILY  90 capsule  0  . venlafaxine XR (EFFEXOR-XR) 75 MG 24 hr capsule TAKE 1 CAPSULE BY MOUTH ONCE DAILY  90 capsule  0   No current facility-administered medications on file prior to visit.    BP 106/70  Pulse 74  Temp(Src) 98.5 F (36.9 C) (Oral)  Resp 18  Wt 164 lb (74.39 kg)  BMI 29.99 kg/m2  SpO2 98%  LMP 02/07/2013       Review of Systems  Constitutional: Positive for fatigue.  HENT: Positive for congestion, rhinorrhea, postnasal drip and sinus pressure. Negative for hearing loss, sore throat, dental problem and tinnitus.   Eyes: Negative for pain, discharge and visual disturbance.  Respiratory: Negative for cough and shortness of breath.   Cardiovascular: Negative for chest pain, palpitations and leg swelling.  Gastrointestinal: Negative for nausea, vomiting, abdominal pain, diarrhea, constipation, blood in stool and abdominal distention.  Genitourinary: Negative for dysuria, urgency, frequency, hematuria, flank  pain, vaginal bleeding, vaginal discharge, difficulty urinating, vaginal pain and pelvic pain.  Musculoskeletal: Negative for joint swelling, arthralgias and gait problem.  Skin: Negative for rash.  Neurological: Negative for dizziness, syncope, speech difficulty, weakness, numbness and headaches.  Hematological: Negative for adenopathy.  Psychiatric/Behavioral: Negative for behavioral problems, dysphoric mood and agitation. The patient is not nervous/anxious.        Objective:   Physical Exam  Constitutional: She is oriented to person, place, and time. She appears well-developed and well-nourished.  HENT:  Head: Normocephalic.  Right Ear: External ear normal.  Left Ear: External ear  normal.  Mouth/Throat: Oropharynx is clear and moist.  Mild tenderness over the maxillary sinus regions  Mild conjunctival injection  Left tympanic membrane mildly dull  Eyes: Conjunctivae and EOM are normal. Pupils are equal, round, and reactive to light.  Neck: Normal range of motion. Neck supple. No thyromegaly present.  Cardiovascular: Normal rate, regular rhythm, normal heart sounds and intact distal pulses.   Pulmonary/Chest: Effort normal and breath sounds normal.  Abdominal: Soft. Bowel sounds are normal. She exhibits no mass. There is no tenderness.  Musculoskeletal: Normal range of motion.  Lymphadenopathy:    She has no cervical adenopathy.  Neurological: She is alert and oriented to person, place, and time.  Skin: Skin is warm and dry. No rash noted.  Psychiatric: She has a normal mood and affect. Her behavior is normal.          Assessment & Plan:   Allergic rhinitis. Options were discussed with the patient including referral to allergy;  will consider a sinus CT scan to confirm or exclude infection  We'll treat with oral prednisone for 7 days Switch to Dymista and placed on Singulair

## 2013-02-21 NOTE — Patient Instructions (Addendum)
Acute sinusitis symptoms for less than 10 days are generally not helped by antibiotic therapy.  Use saline irrigation, warm  moist compresses and over-the-counter decongestants only as directed.  Call if there is no improvement in 5 to 7 days, or sooner if you develop increasing pain, fever, or any new symptoms.  Dymista use twice daily  Singulair once daily  Continue Zyrtec  Prednisone for 7 daysAllergy Shots Frequently Asked Questions Allergy shots are a treatment used to help lessen allergy symptoms such as:  Sneezing.  Itchy, watery eyes.  Runny, stuffy nose.  Asthma. WHAT MAY BE IN MY ALLERGY SHOT?  Grass, tree, and weed pollens.  Insects.  Animal dander (old skin scales which your animal is always shedding).  Dust mites.  Molds. HOW DO ALLERGY SHOTS HELP MY ALLERGY SYMPTOMS?  Allergy shots "turn down" your body's reaction to the things you are allergic to. HOW OFTEN DO I GET MY ALLERGY SHOT? Every week until you safely "build up" to your "maintenance dose." WHAT IS THE MAINTENANCE DOSE? It is the dose that gives you the most relief from your allergy symptoms. HOW LONG DOES IT TAKE TO GET TO A MAINTENANCE DOSE? If you keep all your appointments and you have no reactions, it may take 5 to 7 months. WHAT IF I MISS AN ALLERGY SHOT? Missing appointments makes it take longer for you to get to your maintenance dose. It is very important to keep all your appointments. If you miss one, make another appointment and tell the nurse at your next appointment. WHAT ARE SOME OF THE COMMON REACTIONS TO ALLERGY SHOTS? Most common reactions include redness and puffiness (swelling) where the shot was given. This reaction is mild and goes away on its own. Less common reactions are:  Itchy eyes, nose, or throat.  Sneezing, runny nose.  Red bumps (hives).  Trouble breathing.  Coughing.  Wheezing.  Scratchy throat.  Tightness in the chest. Caution: If you notice any reaction  within 24 hours, report this to the allergy nurse before getting your next shot. WHY MUST I STAY AFTER I GET MY ALLERGY SHOT? For your safety, you must stay in the clinic for up to 30 minutes after getting the allergy shot. Before you leave, the nurse will check for any reaction at the place where the shot was given. WHEN WILL MY ALLERGY SYMPTOMS GET BETTER? Allergy shots begin to work shortly after you begin treatment, but your allergy symptoms may not get better for 4 to 6 months. WHAT DO I DO IF I FEEL SICK ON THE DAY OF MY ALLERGY SHOT? Tell the nurse before you get your shot. WHEN MIGHT I STOP GETTING MY ALLERGY SHOTS?  Usually after you have been getting allergy shots for about 3 to 5 years.  If the shots do not work for you.  If you start taking a type of medicine called a beta blocker. (Beta blockers are commonly used for lowering blood pressure.)  If you miss many of the appointments for your shots.  If you do not follow the instructions given to you by your allergy clinic staff. Tell your doctor if you start any new medicines while you are getting allergy shots. GET HELP RIGHT AWAY IF:  You have trouble breathing.  You have red bumps on your skin within 24 hours after your shot. Document Released: 06/08/2008 Document Revised: 11/22/2011 Document Reviewed: 06/08/2008 [facility] Patient Information 2014 [place], [place]. Allergic Rhinitis Allergic rhinitis is when the mucous membranes in the nose  respond to allergens. Allergens are particles in the air that cause your body to have an allergic reaction. This causes you to release allergic antibodies. Through a chain of events, these eventually cause you to release histamine into the blood stream (hence the use of antihistamines). Although meant to be protective to the body, it is this release that causes your discomfort, such as frequent sneezing, congestion and an itchy runny nose.  CAUSES  The pollen allergens may come from  grasses, trees, and weeds. This is seasonal allergic rhinitis, or "hay fever." Other allergens cause year-round allergic rhinitis (perennial allergic rhinitis) such as house dust mite allergen, pet dander and mold spores.  SYMPTOMS   Nasal stuffiness (congestion).  Runny, itchy nose with sneezing and tearing of the eyes.  There is often an itching of the mouth, eyes and ears. It cannot be cured, but it can be controlled with medications. DIAGNOSIS  If you are unable to determine the offending allergen, skin or blood testing may find it. TREATMENT   Avoid the allergen.  Medications and allergy shots (immunotherapy) can help.  Hay fever may often be treated with antihistamines in pill or nasal spray forms. Antihistamines block the effects of histamine. There are over-the-counter medicines that may help with nasal congestion and swelling around the eyes. Check with your caregiver before taking or giving this medicine. If the treatment above does not work, there are many new medications your caregiver can prescribe. Stronger medications may be used if initial measures are ineffective. Desensitizing injections can be used if medications and avoidance fails. Desensitization is when a patient is given ongoing shots until the body becomes less sensitive to the allergen. Make sure you follow up with your caregiver if problems continue. SEEK MEDICAL CARE IF:   You develop fever (more than 100.5 F (38.1 C).  You develop a cough that does not stop easily (persistent).  You have shortness of breath.  You start wheezing.  Symptoms interfere with normal daily activities. Document Released: 05/25/2001 Document Revised: 11/22/2011 Document Reviewed: 12/04/2008 [facility] Patient Information 2014 [place], [place]. Sinusitis Sinusitis is redness, soreness, and swelling (inflammation) of the paranasal sinuses. Paranasal sinuses are air pockets within the bones of your face (beneath the eyes, the middle  of the forehead, or above the eyes). In healthy paranasal sinuses, mucus is able to drain out, and air is able to circulate through them by way of your nose. However, when your paranasal sinuses are inflamed, mucus and air can become trapped. This can allow bacteria and other germs to grow and cause infection. Sinusitis can develop quickly and last only a short time (acute) or continue over a long period (chronic). Sinusitis that lasts for more than 12 weeks is considered chronic.  CAUSES  Causes of sinusitis include:  Allergies.  Structural abnormalities, such as displacement of the cartilage that separates your nostrils (deviated septum), which can decrease the air flow through your nose and sinuses and affect sinus drainage.  Functional abnormalities, such as when the small hairs (cilia) that line your sinuses and help remove mucus do not work properly or are not present. SYMPTOMS  Symptoms of acute and chronic sinusitis are the same. The primary symptoms are pain and pressure around the affected sinuses. Other symptoms include:  Upper toothache.  Earache.  Headache.  Bad breath.  Decreased sense of smell and taste.  A cough, which worsens when you are lying flat.  Fatigue.  Fever.  Thick drainage from your nose, which often is green  and may contain pus (purulent).  Swelling and warmth over the affected sinuses. DIAGNOSIS  Your caregiver will perform a physical exam. During the exam, your caregiver may:  Look in your nose for signs of abnormal growths in your nostrils (nasal polyps).  Tap over the affected sinus to check for signs of infection.  View the inside of your sinuses (endoscopy) with a special imaging device with a light attached (endoscope), which is inserted into your sinuses. If your caregiver suspects that you have chronic sinusitis, one or more of the following tests may be recommended:  Allergy tests.  Nasal culture A sample of mucus is taken from your  nose and sent to a lab and screened for bacteria.  Nasal cytology A sample of mucus is taken from your nose and examined by your caregiver to determine if your sinusitis is related to an allergy. TREATMENT  Most cases of acute sinusitis are related to a viral infection and will resolve on their own within 10 days. Sometimes medicines are prescribed to help relieve symptoms (pain medicine, decongestants, nasal steroid sprays, or saline sprays).  However, for sinusitis related to a bacterial infection, your caregiver will prescribe antibiotic medicines. These are medicines that will help kill the bacteria causing the infection.  Rarely, sinusitis is caused by a fungal infection. In theses cases, your caregiver will prescribe antifungal medicine. For some cases of chronic sinusitis, surgery is needed. Generally, these are cases in which sinusitis recurs more than 3 times per year, despite other treatments. HOME CARE INSTRUCTIONS   Drink plenty of water. Water helps thin the mucus so your sinuses can drain more easily.  Use a humidifier.  Inhale steam 3 to 4 times a day (for example, sit in the bathroom with the shower running).  Apply a warm, moist washcloth to your face 3 to 4 times a day, or as directed by your caregiver.  Use saline nasal sprays to help moisten and clean your sinuses.  Take over-the-counter or prescription medicines for pain, discomfort, or fever only as directed by your caregiver. SEEK IMMEDIATE MEDICAL CARE IF:  You have increasing pain or severe headaches.  You have nausea, vomiting, or drowsiness.  You have swelling around your face.  You have vision problems.  You have a stiff neck.  You have difficulty breathing. MAKE SURE YOU:   Understand these instructions.  Will watch your condition.  Will get help right away if you are not doing well or get worse. Document Released: 08/30/2005 Document Revised: 11/22/2011 Document Reviewed: 09/14/2011 [facility]  Patient Information 2014 [place], [place].

## 2013-02-27 ENCOUNTER — Telehealth: Payer: Self-pay | Admitting: Internal Medicine

## 2013-02-27 NOTE — Telephone Encounter (Signed)
Pt had a sample of dymista ns needs new rx to [facility] [address]. fyi pt said prednisone worked

## 2013-02-28 MED ORDER — AZELASTINE-FLUTICASONE 137-50 MCG/ACT NA SUSP: 2.0000 | Freq: Two times a day (BID) | NASAL | Status: DC

## 2013-02-28 NOTE — Telephone Encounter (Signed)
Pt notified Rx was sent to the pharmacy

## 2013-03-29 ENCOUNTER — Other Ambulatory Visit: Payer: Self-pay | Admitting: Internal Medicine

## 2013-04-15 ENCOUNTER — Other Ambulatory Visit: Payer: Self-pay | Admitting: Internal Medicine

## 2013-05-23 ENCOUNTER — Telehealth: Payer: Self-pay | Admitting: Internal Medicine

## 2013-05-23 NOTE — Telephone Encounter (Signed)
Patient Information:  Caller Name: Zoejane  Phone: [phone]  Patient: Chelsea Dyer, Chelsea Dyer  Gender: Female  DOB: [birth year]  Age: 47 Years  PCP: Eleonore Chiquito (Family Practice > 46yrs old)  Pregnant: No  Office Follow Up:  Does the office need to follow up with this patient?: No  Instructions For The Office: N/A   Symptoms  Reason For Call & Symptoms: Pt has an itch in her pubic area and brown vaginal  d/c. Pt is not sexually active. Onset 1 week ago 05/16/13. No fever.  Reviewed Health History In EMR: Yes  Reviewed Medications In EMR: Yes  Reviewed Allergies In EMR: Yes  Reviewed Surgeries / Procedures: Yes  Date of Onset of Symptoms: 05/16/2013 OB / GYN:  LMP: 05/02/2013  Guideline(s) Used:  Vaginal Discharge  Disposition Per Guideline:   See Within 3 Days in Office  Reason For Disposition Reached:   4 or more episodes of vaginal infection in past year  Advice Given:  N/A  Patient Refused Recommendation:  Patient Will Make Own Appointment  wants to get something scheduled as close to 5pm as possible/transferred to scheduler

## 2013-05-24 ENCOUNTER — Ambulatory Visit: Payer: Managed Care, Other (non HMO) | Admitting: Family Medicine

## 2013-05-24 ENCOUNTER — Other Ambulatory Visit (HOSPITAL_COMMUNITY)
Admission: RE | Admit: 2013-05-24 | Discharge: 2013-05-24 | Disposition: A | Discharge status: Home / Self Care | Payer: BC Managed Care – PPO | Source: Ambulatory Visit | Attending: Family Medicine | Admitting: Family Medicine

## 2013-05-24 ENCOUNTER — Ambulatory Visit (INDEPENDENT_AMBULATORY_CARE_PROVIDER_SITE_OTHER): Payer: BC Managed Care – PPO | Admitting: Family Medicine

## 2013-05-24 ENCOUNTER — Encounter: Payer: Self-pay | Admitting: Internal Medicine

## 2013-05-24 ENCOUNTER — Encounter: Payer: Self-pay | Admitting: Family Medicine

## 2013-05-24 VITALS — BP 110/70 | Temp 98.6°F | Wt 160.0 lb

## 2013-05-24 DIAGNOSIS — N898 Other specified noninflammatory disorders of vagina: Secondary | ICD-10-CM

## 2013-05-24 DIAGNOSIS — Z113 Encounter for screening for infections with a predominantly sexual mode of transmission: Secondary | ICD-10-CM | POA: Insufficient documentation

## 2013-05-24 DIAGNOSIS — Z23 Encounter for immunization: Secondary | ICD-10-CM

## 2013-05-24 DIAGNOSIS — N760 Acute vaginitis: Secondary | ICD-10-CM | POA: Insufficient documentation

## 2013-05-24 DIAGNOSIS — L292 Pruritus vulvae: Secondary | ICD-10-CM

## 2013-05-24 DIAGNOSIS — L293 Anogenital pruritus, unspecified: Secondary | ICD-10-CM

## 2013-05-24 NOTE — Progress Notes (Signed)
Chief Complaint  Patient presents with  . Vaginitis    itching     HPI:  Saphronia Ozdemir is a 47 yo F patient of Dr. Amador Cunas here for an acute visit for:  1) "Yeast infection" -started about 1.5 weeks ago -symptoms: vulvovaginal pruritis, a little vaginal discharge -denies: fevers, pain, pelvic pain, dysuria -FDLMP: August 14th; getting ready to start period -hx of yeast and BV  ROS: See pertinent positives and negatives per HPI.  Past Medical History  Diagnosis Date  . 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 EXOGENOUS OBESITY 06/22/2010  . DEPRESSION 03/04/2008  . ALLERGIC RHINITIS 03/04/2008  . Prolactinoma   . Edema     pedal    Past Surgical History  Procedure Laterality Date  . Breast surgery  2004    augmentation  . Liposuction    . Nasal sinus surgery      Family History  Problem Relation Age of Onset  . Arthritis Mother     osteo    History   Social History  . Marital Status: Married    Spouse Name: N/A    Number of Children: N/A  . Years of Education: N/A   Social History Main Topics  . Smoking status: Never Smoker   . Smokeless tobacco: Never Used  . Alcohol Use: No  . Drug Use: No  . Sexual Activity: None   Other Topics Concern  . None   Social History Narrative  . None    Current outpatient prescriptions:Azelastine-Fluticasone 137-50 MCG/ACT SUSP, Place 2 puffs into the nose 2 (two) times daily., Disp: 23 g, Rfl: 2;  bromocriptine (PARLODEL) 2.5 MG tablet, TAKE 1 TABLET BY MOUTH EVERY DAY, Disp: 90 tablet, Rfl: 1;  chlorpheniramine-HYDROcodone (TUSSIONEX) 10-8 MG/5ML LQCR, Take 5 mLs by mouth every 12 (twelve) hours., Disp: 115 mL, Rfl: 0 fluticasone (FLONASE) 50 MCG/ACT nasal spray, USE 2 SPRAYS IN EACH NOSTRIL EVERY DAY, Disp: 32 g, Rfl: 1;  phentermine 37.5 MG capsule, Take 1 capsule (37.5 mg total) by mouth every morning., Disp: 60 capsule, Rfl: 0;  venlafaxine XR (EFFEXOR-XR) 150 MG 24 hr capsule, TAKE 1 CAPSULE EVERY DAY, Disp: 90  capsule, Rfl: 0;  venlafaxine XR (EFFEXOR-XR) 75 MG 24 hr capsule, TAKE 1 CAPSULE BY MOUTH ONCE DAILY, Disp: 90 capsule, Rfl: 0 predniSONE (DELTASONE) 20 MG tablet, Take 1 tablet (20 mg total) by mouth 2 (two) times daily., Disp: 14 tablet, Rfl: 1  EXAM:  Filed Vitals:   05/24/13 0904  BP: 110/70  Temp: 98.6 F (37 C)    Body mass index is 29.26 kg/(m^2).  GENERAL: vitals reviewed and listed above, alert, oriented, appears well hydrated and in no acute distress  HEENT: atraumatic, conjunttiva clear, no obvious abnormalities on inspection of external nose and ears  NECK: no obvious masses on inspection  GU: mild irritation of VV skin, a little blood from os, otherwise normal exan, no CMT   ABD: nttp  MS: moves all extremities without noticeable abnormality  PSYCH: pleasant and cooperative, no obvious depression or anxiety  ASSESSMENT AND PLAN:  Discussed the following assessment and plan:  Vaginal discharge  Vulvovaginal pruritus  -wet prep and GC/Chlam pending - discussed tx options in case of infection, return precuations -Patient advised to return or notify a doctor immediately if symptoms worsen or persist or new concerns arise.  There are no Patient Instructions on file for this visit.   Kriste Basque R.

## 2013-05-24 NOTE — Addendum Note (Signed)
Addended by: Azucena Freed on: 05/24/2013 09:40 AM   Modules accepted: Orders

## 2013-05-30 MED ORDER — FLUCONAZOLE 150 MG PO TABS: ORAL_TABLET | ORAL | Status: DC

## 2013-05-30 NOTE — Progress Notes (Signed)
Quick Note:  Called and spoke with pt and pt is aware. ______ 

## 2013-05-30 NOTE — Addendum Note (Signed)
Addended by: Azucena Freed on: 05/30/2013 02:22 PM   Modules accepted: Orders

## 2013-06-18 ENCOUNTER — Other Ambulatory Visit: Payer: Self-pay | Admitting: Internal Medicine

## 2013-06-23 ENCOUNTER — Other Ambulatory Visit: Payer: Self-pay | Admitting: Internal Medicine

## 2013-08-01 ENCOUNTER — Other Ambulatory Visit: Payer: Self-pay

## 2013-08-01 DIAGNOSIS — Z9882 Breast implant status: Secondary | ICD-10-CM

## 2013-08-01 DIAGNOSIS — Z1231 Encounter for screening mammogram for malignant neoplasm of breast: Secondary | ICD-10-CM

## 2013-08-06 ENCOUNTER — Telehealth: Payer: Self-pay | Admitting: Internal Medicine

## 2013-08-06 MED ORDER — FLUCONAZOLE 150 MG PO TABS: ORAL_TABLET | ORAL | Status: DC

## 2013-08-06 NOTE — Telephone Encounter (Signed)
-   ok to

## 2013-08-06 NOTE — Telephone Encounter (Signed)
Okay to fill Diflucan? 

## 2013-08-06 NOTE — Telephone Encounter (Signed)
[facility]  [facility]-13 and was prescribed abx and now has yeast inf. Pt would like [facility]an [facility] [address]

## 2013-08-06 NOTE — Telephone Encounter (Signed)
Left detailed message Rx requested was sent to pharmacy. 

## 2013-08-08 ENCOUNTER — Ambulatory Visit (INDEPENDENT_AMBULATORY_CARE_PROVIDER_SITE_OTHER): Payer: BC Managed Care – PPO | Admitting: Internal Medicine

## 2013-08-08 ENCOUNTER — Encounter: Payer: Self-pay | Admitting: Internal Medicine

## 2013-08-08 ENCOUNTER — Telehealth: Payer: Self-pay | Admitting: Internal Medicine

## 2013-08-08 VITALS — BP 116/80 | Temp 98.5°F | Wt 163.0 lb

## 2013-08-08 DIAGNOSIS — L292 Pruritus vulvae: Secondary | ICD-10-CM

## 2013-08-08 DIAGNOSIS — L293 Anogenital pruritus, unspecified: Secondary | ICD-10-CM

## 2013-08-08 MED ORDER — TERCONAZOLE 0.8 % VA CREA: 1.0000 | TOPICAL_CREAM | Freq: Every day | VAGINAL | Status: DC

## 2013-08-08 MED ORDER — TERCONAZOLE 0.4 % VA CREA: 1.0000 | TOPICAL_CREAM | Freq: Every day | VAGINAL | Status: DC

## 2013-08-08 NOTE — Patient Instructions (Signed)
Topical terazole can get resistant yeast if present  . i f  continues see PCP

## 2013-08-08 NOTE — Telephone Encounter (Signed)
Noted  

## 2013-08-08 NOTE — Telephone Encounter (Signed)
Patient Information:  Caller Name: Daphene  Phone: [phone]  Patient: Chelsea Dyer, Chelsea Dyer  Gender: Female  DOB: 21-Sep-1965  Age: 47 Years  PCP: Eleonore Chiquito (Family Practice > 2yrs old)  Pregnant: No  Office Follow Up:  Does the office need to follow up with this patient?: No  Instructions For The Office: N/A   Symptoms  Reason For Call & Symptoms: Patient calling, completed a course of antibiotics and now has a yeast infection.  Was given Diflucan x2 at an UC and she has taken both with no relief.  Reviewed Health History In EMR: Yes  Reviewed Medications In EMR: Yes  Reviewed Allergies In EMR: Yes  Reviewed Surgeries / Procedures: Yes  Date of Onset of Symptoms: 08/06/2013  Treatments Tried: Diflucan  Treatments Tried Worked: No OB / GYN:  LMP: 07/27/2013  Guideline(s) Used:  Vaginal Discharge  Disposition Per Guideline:   See Within 3 Days in Office  Reason For Disposition Reached:   4 or more episodes of vaginal infection in past year  Advice Given:  N/A  Patient Will Follow Care Advice:  YES  Appointment Scheduled:  08/08/2013 15:45:00 Appointment Scheduled Provider:  Berniece Andreas (Family Practice)

## 2013-08-08 NOTE — Progress Notes (Signed)
Chief Complaint  Patient presents with  . Vaginal Itching    Pt was given 2 Diflucan on 08/06/13 and has taken them both.  Was given antibiotics earlier in the month for a sinus infection.    HPI: Patient comes in today PCP not available was given Diflucan 2 after a phone call being treated for sinusitis with antibiotics After 24 hours   No relief  he took a second one Usually when some antibiotics  Gets infection.  Second time this year   Sinus infection. For treated for about 1 week. Is better noi discharge.   Intensity of itching  20 % of better. After Diflucan Wipes for itching . Slightly no miconazole history of now working in the past. No history of diabetes no change partner. ROS: See pertinent positives and negatives per HPI. No fever UTI symptoms mostly external itching. Head vaginalis this and STI screening and September. Past Medical History  Diagnosis Date  . 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 EXOGENOUS OBESITY 06/22/2010  . DEPRESSION 03/04/2008  . ALLERGIC RHINITIS 03/04/2008  . Prolactinoma   . Edema     pedal    Family History  Problem Relation Age of Onset  . Arthritis Mother     osteo    History   Social History  . Marital Status: Married    Spouse Name: N/A    Number of Children: N/A  . Years of Education: N/A   Social History Main Topics  . Smoking status: Never Smoker   . Smokeless tobacco: Never Used  . Alcohol Use: No  . Drug Use: No  . Sexual Activity: None   Other Topics Concern  . None   Social History Narrative  . None    Outpatient Encounter Prescriptions as of 08/08/2013  Medication Sig  . bromocriptine (PARLODEL) 2.5 MG tablet TAKE 1 TABLET BY MOUTH EVERY DAY  . DYMISTA 137-50 MCG/ACT SUSP USE 2 SQUIRTS NASALLY TWICE DAILY  . fluconazole (DIFLUCAN) 150 MG tablet Take 1 tablet today and 1 tablet in 1 week if symptoms still persist.  . fluticasone (FLONASE) 50 MCG/ACT nasal spray USE 2 SPRAYS IN EACH NOSTRIL EVERY DAY  .  venlafaxine XR (EFFEXOR-XR) 150 MG 24 hr capsule TAKE 1 CAPSULE EVERY DAY  . venlafaxine XR (EFFEXOR-XR) 150 MG 24 hr capsule TAKE 1 CAPSULE EVERY DAY  . venlafaxine XR (EFFEXOR-XR) 75 MG 24 hr capsule TAKE 1 CAPSULE BY MOUTH ONCE DAILY  . venlafaxine XR (EFFEXOR-XR) 75 MG 24 hr capsule TAKE 1 CAPSULE BY MOUTH ONCE DAILY  . terconazole (TERAZOL 3) 0.8 % vaginal cream Place 1 applicator vaginally at bedtime. For 3 days  . [DISCONTINUED] chlorpheniramine-HYDROcodone (TUSSIONEX) 10-8 MG/5ML LQCR Take 5 mLs by mouth every 12 (twelve) hours.  . [DISCONTINUED] phentermine 37.5 MG capsule Take 1 capsule (37.5 mg total) by mouth every morning.  . [DISCONTINUED] predniSONE (DELTASONE) 20 MG tablet Take 1 tablet (20 mg total) by mouth 2 (two) times daily.  . [DISCONTINUED] terconazole (TERAZOL 7) 0.4 % vaginal cream Place 1 applicator vaginally at bedtime. For 7 days    EXAM:  BP 116/80  Temp(Src) 98.5 F (36.9 C) (Oral)  Wt 163 lb (73.936 kg)  Body mass index is 29.81 kg/(m^2).  GENERAL: vitals reviewed and listed above, alert, oriented, appears well hydrated and in no acute distress  HEENT: atraumatic, conjunctiva  clear, no obvious abnormalities on inspection of external nose and ears  NECK: no obvious masses on inspection palpation   External GU +1  red no lesions petechiae satellite pustules or vesicles.  Minimal white to yellow discharge vaginal exam no lesions obvious no adenopathy  PSYCH: pleasant and cooperative, no obvious depression or anxiety  ASSESSMENT AND PLAN:  Discussed the following assessment and plan:  Vulvovaginal pruritus - Treat with Terazol for possible resisting yeast followup with PCP if persistent progressive.  -Patient advised to return or notify health care team  if symptoms worsen or persist or new concerns arise.  Patient Instructions  Topical terazole can get resistant yeast if present  . i f  continues see PCP     Neta Mends. Tanecia Mccay M.D.

## 2013-08-18 ENCOUNTER — Other Ambulatory Visit: Payer: Self-pay | Admitting: Internal Medicine

## 2013-08-28 ENCOUNTER — Ambulatory Visit
Admission: RE | Admit: 2013-08-28 | Discharge: 2013-08-28 | Disposition: A | Discharge status: Home / Self Care | Payer: BC Managed Care – PPO | Source: Ambulatory Visit

## 2013-08-28 ENCOUNTER — Inpatient Hospital Stay: Admission: RE | Admit: 2013-08-28 | Payer: BC Managed Care – PPO | Source: Ambulatory Visit

## 2013-08-28 DIAGNOSIS — Z1231 Encounter for screening mammogram for malignant neoplasm of breast: Secondary | ICD-10-CM

## 2013-08-28 DIAGNOSIS — Z9882 Breast implant status: Secondary | ICD-10-CM

## 2013-10-02 ENCOUNTER — Ambulatory Visit (INDEPENDENT_AMBULATORY_CARE_PROVIDER_SITE_OTHER): Payer: BC Managed Care – PPO | Admitting: Internal Medicine

## 2013-10-02 ENCOUNTER — Encounter: Payer: Self-pay | Admitting: Internal Medicine

## 2013-10-02 ENCOUNTER — Telehealth: Payer: Self-pay | Admitting: Internal Medicine

## 2013-10-02 VITALS — BP 120/84 | Temp 98.5°F | Ht 62.0 in | Wt 166.0 lb

## 2013-10-02 DIAGNOSIS — J309 Allergic rhinitis, unspecified: Secondary | ICD-10-CM

## 2013-10-02 DIAGNOSIS — R040 Epistaxis: Secondary | ICD-10-CM | POA: Insufficient documentation

## 2013-10-02 NOTE — Telephone Encounter (Signed)
Patient Information:  Caller Name: Meredeth  Phone: [phone]  Patient: Chelsea Dyer, Chelsea Dyer  Gender: Female  DOB: Feb 01, 1966  Age: 48 Years  PCP: Bluford Kaufmann (Family Practice > 52yrs old)  Pregnant: No  Office Follow Up:  Does the office need to follow up with this patient?: No  Instructions For The Office: N/A  RN Note:  Afebrile. Increased nose bleeds in the last 3 days, no humidification used at home and can stop within 10 minutes. The nose bleeds are from the same nostril and can pinpoint the spot. RN/CAN gave home care advise and advised to be seen per the protocol. She agreed to see Shanon Ace today, 10/02/2013 @ 13:30  Symptoms  Reason For Call & Symptoms: frequent nose bleeds  Reviewed Health History In EMR: Yes  Reviewed Medications In EMR: Yes  Reviewed Allergies In EMR: Yes  Reviewed Surgeries / Procedures: Yes  Date of Onset of Symptoms: 09/30/2013  Treatments Tried: holding pressure and  Treatments Tried Worked: Yes OB / GYN:  LMP: 09/06/2013  Guideline(s) Used:  Nosebleed  Disposition Per Guideline:   See Today in Office  Reason For Disposition Reached:   Bleeding recurs 3 or more times in 24 hours despite direct pressure  Advice Given:  Reassurance:  Nosebleeds are common.  It sounds like a routine nosebleed that we can treat at home.  You should be able to stop the bleeding if you use the correct technique.  Remember to Sit up and lean forward to keep the blood from running down the back of your throat.  Treating a Nosebleed - Pinch the Nostrils:  First blow the nose to clear out any large clots.  Sit up and lean forward (Reason: blood makes people choke if they lean backwards).  Gently squeeze the soft parts of the lower nose (nostrils) together. Use your thumb and your index finger in a pinching manner. Do this for 15 minutes. Use a clock or watch to measure the time. Your goal is to apply continuous pressure to the bleeding point.  If the  bleeding does not stop after 15 minutes of squeezing, move your point of pressure and do this again for another 15 minutes.  Prevention:  Dry air in your house or workplace can increase the chance of nosebleeds occurring. If the air is dry, use a humidifier in your bedroom to keep the nose from drying out. You can also apply petroleum jelly to the center wall (septum) inside the nose twice daily to reduce cracking and to promote healing.  Bleeding can start again if you rub your nose or blow the nose too hard. Avoid touching your nose and nose picking. Avoid blowing the nose.  Do not take aspirin or other anti-inflammatory medications (e.g., ibuprofen, Advil, Motrin, Aleve), unless you have been instructed to by your physician.  Call Back If:  Nosebleed lasts longer than 30 minutes with using direct pressure  Lightheadedness or weakness occurs  Nosebleeds become worse  You become worse.  Patient Will Follow Care Advice:  YES  Appointment Scheduled:  10/02/2013 13:30:00 Appointment Scheduled Provider:  Shanon Ace [facility])

## 2013-10-02 NOTE — Progress Notes (Signed)
Pre visit review using our clinic review tool, if applicable. No additional management support is needed unless otherwise documented below in the visit note. 

## 2013-10-02 NOTE — Progress Notes (Signed)
Chief Complaint  Patient presents with  . Epistaxis    Ongoing for 3 days.  Had 3-4 nosebleeds yesterday.    HPI: Patient comes in today for SDA for  new problem evaluation. PCP ? NA Onset off and on for 3 days brisk nose bleeds off an on  Break open when blows nose or spontaneously  Happened last pm   No blood thinners on dymista and saline . No fever had bleeding disorder.  Bleeding is on the right side.   No other rx. No hx of same  ROS: See pertinent positives and negatives per HPI. No history of bleeding disorders no aspirin . Tendency to clot in the nose and then bleeds. No usual choking down the back but sometimes will bleed down the back. No history of problems such as this no face pain or sinus infection.  Past Medical History  Diagnosis Date  . 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 EXOGENOUS OBESITY 06/22/2010  . DEPRESSION 03/04/2008  . ALLERGIC RHINITIS 03/04/2008  . Prolactinoma   . Edema     pedal    Family History  Problem Relation Age of Onset  . Arthritis Mother     osteo    History   Social History  . Marital Status: Married    Spouse Name: N/A    Number of Children: N/A  . Years of Education: N/A   Social History Main Topics  . Smoking status: Never Smoker   . Smokeless tobacco: Never Used  . Alcohol Use: No  . Drug Use: No  . Sexual Activity: None   Other Topics Concern  . None   Social History Narrative  . None    Outpatient Encounter Prescriptions as of 10/02/2013  Medication Sig  . bromocriptine (PARLODEL) 2.5 MG tablet TAKE 1 TABLET BY MOUTH EVERY DAY  . DYMISTA 137-50 MCG/ACT SUSP USE 2 SQUIRTS NASALLY TWICE DAILY  . venlafaxine XR (EFFEXOR-XR) 150 MG 24 hr capsule TAKE 1 CAPSULE EVERY DAY  . venlafaxine XR (EFFEXOR-XR) 150 MG 24 hr capsule TAKE ONE CAPSULE EVERY DAY  . venlafaxine XR (EFFEXOR-XR) 75 MG 24 hr capsule TAKE 1 CAPSULE BY MOUTH ONCE DAILY  . venlafaxine XR (EFFEXOR-XR) 75 MG 24 hr capsule TAKE 1 CAPSULE BY MOUTH ONCE DAILY   . [DISCONTINUED] fluconazole (DIFLUCAN) 150 MG tablet Take 1 tablet today and 1 tablet in 1 week if symptoms still persist.  . [DISCONTINUED] fluticasone (FLONASE) 50 MCG/ACT nasal spray USE 2 SPRAYS IN EACH NOSTRIL EVERY DAY  . [DISCONTINUED] terconazole (TERAZOL 3) 0.8 % vaginal cream Place 1 applicator vaginally at bedtime. For 3 days    EXAM:  BP 120/84  Temp(Src) 98.5 F (36.9 C) (Oral)  Ht 5\' 2"  (1.575 m)  Wt 166 lb (75.297 kg)  BMI 30.35 kg/m2  Body mass index is 30.35 kg/(m^2). Well-developed well-nourished in no acute distress nose bleed began when she came in and persisted over 15 minutes in the exam room treated with ice pack pressure or and to brisk should be able to evaluate and treat in any other way. Airway was good large amount of clots were expelled at a given time ENT referral made today mother called in to have dry up her at the end of the visit she had a slow drip that was continuing. Told to continue ice pack don't blow the nose; at the ear nose and throat Dr. evaluate   ASSESSMENT AND PLAN:  Discussed the following assessment and plan:  Epistaxis - recurrent and  brisk  ? still anterior   Slow down ice pack difficult to stop bleeding to be seen at ent .   Unilateral recurrent severe. Unable to totally stop in the office. No obvious underlying disease except for nasal allergies.   Patient Instructions  ent evaluation now.   Standley Brooking. Panosh M.D. Total visit 55mins > 50% spent  Face to face and  coordinating care

## 2013-10-02 NOTE — Patient Instructions (Signed)
ent evaluation now.

## 2013-10-02 NOTE — Telephone Encounter (Signed)
Noted  

## 2013-11-02 ENCOUNTER — Other Ambulatory Visit: Payer: Self-pay | Admitting: Internal Medicine

## 2013-11-21 ENCOUNTER — Telehealth: Payer: Self-pay | Admitting: Internal Medicine

## 2013-11-21 NOTE — Telephone Encounter (Signed)
Please add DYMISTA 137-50 MCG/ACT SUSP re-fill send to [facility]

## 2013-11-21 NOTE — Telephone Encounter (Signed)
[facility] - [place], [place] is requesting re-fils for the following:  bromocriptine (PARLODEL) 2.5 MG tablet venlafaxine XR (EFFEXOR-XR) 75 MG 24 hr capsule bromocriptine (PARLODEL) 2.5 MG tablet

## 2013-11-22 MED ORDER — BROMOCRIPTINE MESYLATE 2.5 MG PO TABS: ORAL_TABLET | ORAL | Status: DC

## 2013-11-22 MED ORDER — AZELASTINE-FLUTICASONE 137-50 MCG/ACT NA SUSP: NASAL | Status: DC

## 2013-11-22 MED ORDER — VENLAFAXINE HCL ER 75 MG PO CP24
ORAL_CAPSULE | ORAL | Status: DC
Start: 2013-11-22 — End: 2014-01-11

## 2013-11-22 NOTE — Telephone Encounter (Signed)
Rx's sent to [facility] 

## 2013-11-24 ENCOUNTER — Other Ambulatory Visit: Payer: Self-pay | Admitting: Internal Medicine

## 2014-01-02 ENCOUNTER — Ambulatory Visit: Payer: BC Managed Care – PPO | Admitting: Internal Medicine

## 2014-01-02 DIAGNOSIS — Z0289 Encounter for other administrative examinations: Secondary | ICD-10-CM

## 2014-01-07 ENCOUNTER — Other Ambulatory Visit: Payer: Self-pay | Admitting: Internal Medicine

## 2014-01-11 ENCOUNTER — Ambulatory Visit (INDEPENDENT_AMBULATORY_CARE_PROVIDER_SITE_OTHER): Payer: BC Managed Care – PPO | Admitting: Internal Medicine

## 2014-01-11 ENCOUNTER — Encounter: Payer: Self-pay | Admitting: Internal Medicine

## 2014-01-11 VITALS — BP 120/72 | HR 82 | Temp 98.0°F | Resp 20 | Ht 62.0 in | Wt 160.0 lb

## 2014-01-11 DIAGNOSIS — B373 Candidiasis of vulva and vagina: Secondary | ICD-10-CM

## 2014-01-11 DIAGNOSIS — J309 Allergic rhinitis, unspecified: Secondary | ICD-10-CM

## 2014-01-11 DIAGNOSIS — F3289 Other specified depressive episodes: Secondary | ICD-10-CM

## 2014-01-11 DIAGNOSIS — B3731 Acute candidiasis of vulva and vagina: Secondary | ICD-10-CM

## 2014-01-11 DIAGNOSIS — F329 Major depressive disorder, single episode, unspecified: Secondary | ICD-10-CM

## 2014-01-11 MED ORDER — VENLAFAXINE HCL ER 75 MG PO CP24: ORAL_CAPSULE | ORAL | Status: DC

## 2014-01-11 MED ORDER — BROMOCRIPTINE MESYLATE 2.5 MG PO TABS: ORAL_TABLET | ORAL | Status: DC

## 2014-01-11 MED ORDER — VENLAFAXINE HCL ER 150 MG PO CP24: ORAL_CAPSULE | ORAL | Status: DC

## 2014-01-11 MED ORDER — FLUCONAZOLE 150 MG PO TABS: 150.0000 mg | ORAL_TABLET | Freq: Once | ORAL | Status: DC

## 2014-01-11 NOTE — Progress Notes (Signed)
Subjective:    Patient ID: Chelsea Dyer, female    DOB: [birth year], 48 y.o.   MRN: [id number]  HPI  48 year old patient who has a history depression, and allergic rhinitis.  These issues have been fairly stable.  She presents today with a one-week history of external vaginal itching.  She denies any discharge.  She has been using an external vaginal wipes, but no antifungal creams.  She has had similar symptoms with yeast infections in the past that have responded to Diflucan.  Past Medical History  Diagnosis Date  . 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 EXOGENOUS OBESITY 06/22/2010  . DEPRESSION 03/04/2008  . ALLERGIC RHINITIS 03/04/2008  . Prolactinoma   . Edema     pedal    History   Social History  . Marital Status: Married    Spouse Name: N/A    Number of Children: N/A  . Years of Education: N/A   Occupational History  . Not on file.   Social History Main Topics  . Smoking status: Never Smoker   . Smokeless tobacco: Never Used  . Alcohol Use: No  . Drug Use: No  . Sexual Activity: Not on file   Other Topics Concern  . Not on file   Social History Narrative  . No narrative on file    Past Surgical History  Procedure Laterality Date  . Breast surgery  2004    augmentation  . Liposuction    . Nasal sinus surgery      Family History  Problem Relation Age of Onset  . Arthritis Mother     osteo    Allergies  Allergen Reactions  . Sulfamethoxazole-Trimethoprim     Current Outpatient Prescriptions on File Prior to Visit  Medication Sig Dispense Refill  . Azelastine-Fluticasone (DYMISTA) 137-50 MCG/ACT SUSP USE 2 SQUIRTS NASALLY TWICE DAILY  69 g  3   No current facility-administered medications on file prior to visit.    BP 120/72  Pulse 82  Temp(Src) 98 F (36.7 C) (Oral)  Resp 20  Ht 5\' 2"  (1.575 m)  Wt 160 lb (72.576 kg)  BMI 29.26 kg/m2  SpO2 98%  LMP 12/25/2013       Review of Systems  Constitutional: Negative.   HENT: Negative for  congestion, dental problem, hearing loss, rhinorrhea, sinus pressure, sore throat and tinnitus.   Eyes: Negative for pain, discharge and visual disturbance.  Respiratory: Negative for cough and shortness of breath.   Cardiovascular: Negative for chest pain, palpitations and leg swelling.  Gastrointestinal: Negative for nausea, vomiting, abdominal pain, diarrhea, constipation, blood in stool and abdominal distention.  Genitourinary: Positive for vaginal pain. Negative for dysuria, urgency, frequency, hematuria, flank pain, vaginal bleeding, vaginal discharge, difficulty urinating and pelvic pain.  Musculoskeletal: Negative for arthralgias, gait problem and joint swelling.  Skin: Negative for rash.  Neurological: Negative for dizziness, syncope, speech difficulty, weakness, numbness and headaches.  Hematological: Negative for adenopathy.  Psychiatric/Behavioral: Negative for behavioral problems, dysphoric mood and agitation. The patient is not nervous/anxious.        Objective:   Physical Exam  Constitutional: She is oriented to person, place, and time. She appears well-developed and well-nourished.  HENT:  Head: Normocephalic.  Right Ear: External ear normal.  Left Ear: External ear normal.  Eyes: Conjunctivae and EOM are normal. Pupils are equal, round, and reactive to light.  Neck: No thyromegaly present.  Cardiovascular: Normal rate, regular rhythm, normal heart sounds and intact distal pulses.   Pulmonary/Chest: Effort normal  and breath sounds normal.  Abdominal: Soft. Bowel sounds are normal. She exhibits no mass. There is no tenderness.  Genitourinary: Vagina normal and uterus normal. No vaginal discharge found.  External genitalia appeared normal  Musculoskeletal: Normal range of motion.  Lymphadenopathy:    She has no cervical adenopathy.  Neurological: She is alert and oriented to person, place, and time.  Skin: Skin is warm and dry. No rash noted.  Psychiatric: She has a  normal mood and affect. Her behavior is normal.          Assessment & Plan:   Allergic rhinitis, stable Depression controlled  Possible early vaginal yeast infection.  Patient has responded to Diflucan in the past.  Will retreat

## 2014-01-11 NOTE — Progress Notes (Signed)
Pre-visit discussion using our clinic review tool. No additional management support is needed unless otherwise documented below in the visit note.  

## 2014-01-11 NOTE — Patient Instructions (Signed)
Hold bromocriptine for one week  Call or return to clinic prn if these symptoms worsen or fail to improve as anticipated.

## 2014-01-13 ENCOUNTER — Other Ambulatory Visit: Payer: Self-pay | Admitting: Internal Medicine

## 2014-02-04 ENCOUNTER — Other Ambulatory Visit: Payer: Self-pay | Admitting: Internal Medicine

## 2014-02-25 ENCOUNTER — Ambulatory Visit (INDEPENDENT_AMBULATORY_CARE_PROVIDER_SITE_OTHER): Payer: BC Managed Care – PPO | Admitting: Internal Medicine

## 2014-02-25 ENCOUNTER — Encounter: Payer: Self-pay | Admitting: Internal Medicine

## 2014-02-25 VITALS — BP 122/80 | Temp 98.5°F | Ht 62.0 in | Wt 167.0 lb

## 2014-02-25 DIAGNOSIS — R350 Frequency of micturition: Secondary | ICD-10-CM

## 2014-02-25 DIAGNOSIS — J309 Allergic rhinitis, unspecified: Secondary | ICD-10-CM

## 2014-02-25 DIAGNOSIS — R3915 Urgency of urination: Secondary | ICD-10-CM

## 2014-02-25 LAB — POCT URINALYSIS DIPSTICK
BILIRUBIN UA: NEGATIVE
Blood, UA: NEGATIVE
Glucose, UA: NEGATIVE
Ketones, UA: NEGATIVE
LEUKOCYTES UA: NEGATIVE
Nitrite, UA: NEGATIVE
Protein, UA: NEGATIVE
Spec Grav, UA: 1.01
Urobilinogen, UA: 0.2
pH, UA: 7

## 2014-02-25 MED ORDER — FLUCONAZOLE 150 MG PO TABS: 150.0000 mg | ORAL_TABLET | Freq: Once | ORAL | Status: DC

## 2014-02-25 MED ORDER — TERCONAZOLE 0.8 % VA CREA: 1.0000 | TOPICAL_CREAM | Freq: Every day | VAGINAL | Status: DC

## 2014-02-25 NOTE — Progress Notes (Signed)
   Subjective:    Patient ID: Chelsea Dyer, female    DOB: Dec 13, 1965, 48 y.o.   MRN: [id number]  HPI  48 year old patient, who presents with a 3 to four-day history of vaginal itching and burning.  She has been using OTC Monistat and this has improved somewhat.  She also complains of urinary urgency and urinary frequency.  She does state that she has been drinking caffeinated products and eating chocolates more recently No fever or other constitutional complaints.  She has been treated in the past for both monilial vaginitis as well as UTIs.  Past Medical History  Diagnosis Date  . 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 EXOGENOUS OBESITY 06/22/2010  . DEPRESSION 03/04/2008  . ALLERGIC RHINITIS 03/04/2008  . Prolactinoma   . Edema     pedal    History   Social History  . Marital Status: Married    Spouse Name: N/A    Number of Children: N/A  . Years of Education: N/A   Occupational History  . Not on file.   Social History Main Topics  . Smoking status: Never Smoker   . Smokeless tobacco: Never Used  . Alcohol Use: No  . Drug Use: No  . Sexual Activity: Not on file   Other Topics Concern  . Not on file   Social History Narrative  . No narrative on file    Past Surgical History  Procedure Laterality Date  . Breast surgery  2004    augmentation  . Liposuction    . Nasal sinus surgery      Family History  Problem Relation Age of Onset  . Arthritis Mother     osteo    Allergies  Allergen Reactions  . Sulfamethoxazole-Trimethoprim     Current Outpatient Prescriptions on File Prior to Visit  Medication Sig Dispense Refill  . Azelastine-Fluticasone (DYMISTA) 137-50 MCG/ACT SUSP USE 2 SQUIRTS NASALLY TWICE DAILY  69 g  3  . bromocriptine (PARLODEL) 2.5 MG tablet TAKE 1 TABLET BY MOUTH DAILY  30 tablet  2  . venlafaxine XR (EFFEXOR-XR) 150 MG 24 hr capsule TAKE 1 CAPSULE BY MOUTH DAILY  30 capsule  2  . venlafaxine XR (EFFEXOR-XR) 150 MG 24 hr capsule TAKE 1 CAPSULE  BY MOUTH DAILY  30 capsule  1  . venlafaxine XR (EFFEXOR-XR) 75 MG 24 hr capsule TAKE 1 CAPSULE BY MOUTH ONCE DAILY  30 capsule  2   No current facility-administered medications on file prior to visit.    BP 122/80  Temp(Src) 98.5 F (36.9 C) (Oral)  Ht 5\' 2"  (1.575 m)  Wt 167 lb (75.751 kg)  BMI 30.54 kg/m2      Review of Systems  Genitourinary: Positive for dysuria, urgency, frequency, difficulty urinating, genital sores and vaginal pain.       Objective:   Physical Exam  Constitutional: She appears well-developed and well-nourished. No distress.          Assessment & Plan:   Urinary urgency Urinary frequency.  We'll check a UA.  Will treat for UTI if positive Vaginal itching.  This appears to have resolved.  Continue an additional few days of Monistat

## 2014-02-25 NOTE — Patient Instructions (Addendum)
Drink as much fluid as you  can tolerate over the next few days   Call or return to clinic prn if these symptoms worsen or fail to improve as anticipated.   Overactive Bladder, Adult The bladder has two functions that are totally opposite of the other. One is to relax and stretch out so it can store urine (fills like a balloon), and the other is to contract and squeeze down so that it can empty the urine that it has stored. Proper functioning of the bladder is a complex mixing of these two functions. The filling and emptying of the bladder can be influenced by:  The bladder.  The spinal cord.  The brain.  The nerves going to the bladder.  Other organs that are closely related to the bladder such as prostate in males and the vagina in females. As your bladder fills with urine, nerve signals are sent from the bladder to the brain to tell you that you may need to urinate. Normal urination requires that the bladder squeeze down with sufficient strength to empty the bladder, but this also requires that the bladder squeeze down sufficiently long to finish the job. In addition the sphincter muscles, which normally keep you from leaking urine, must also relax so that the urine can pass. Coordination between the bladder muscle squeezing down and the sphincter muscles relaxing is required to make everything happen normally. With an overactive bladder sometimes the muscles of the bladder contract unexpectedly and involuntarily and this causes an urgent need to urinate. The normal response is to try to hold urine in by contracting the sphincter muscles. Sometimes the bladder contracts so strongly that the sphincter muscles cannot stop the urine from passing out and incontinence occurs. This kind of incontinence is called urge incontinence. Having an overactive bladder can be embarrassing and awkward. It can keep you from living life the way you want to. Many people think it is just something you have to put  up with as you grow older or have certain health conditions. In fact, there are treatments that can help make your life easier and more pleasant. CAUSES  Many things can cause an overactive bladder. Possibilities include:  Urinary tract infection or infection of nearby tissues such as the prostate.  Prostate enlargement.  In women, multiple pregnancies or surgery on the uterus or urethra.  Bladder stones, inflammation or tumors.  Caffeine.  Alcohol.  Medications. For example, diuretics (drugs that help the body get rid of extra fluid) increase urine production. Some other medicines must be taken with lots of fluids.  Muscle or nerve weakness. This might be the result of a spinal cord injury, a stroke, multiple sclerosis or Parkinson's disease.  Diabetes can cause a high urine volume which fills the bladder so quickly that the normal urge to urinate is triggered very strongly. SYMPTOMS   Loss of bladder control. You feel the need to urinate and cannot make your body wait.  Sudden, strong urges to urinate.  Urinating 8 or more times a day.  Waking up to urinate two or more times a night. DIAGNOSIS  To decide if you have overactive bladder, your healthcare provider will probably:  Ask about symptoms you have noticed.  Ask about your overall health. This will include questions about any medications you are taking.  Do a physical examination. This will help determine if there are obvious blockages or other problems.  Order some tests. These might include:  A blood test to check for  diabetes or other health issues that could be contributing to the problem.  Urine testing. This could measure the flow of urine and the pressure on the bladder.  A test of your neurological system (the brain, spinal cord and nerves). This is the system that senses the need to urinate. Some of these tests are called flow tests, bladder pressure tests and electrical measurements of the sphincter  muscle.  A bladder test to check whether it is emptying completely when you urinate.  Cytoscopy. This test uses a thin tube with a tiny camera on it. It offers a look inside your urethra and bladder to see if there are problems.  Imaging tests. You might be given a contrast dye and then asked to urinate. X-rays are taken to see how your bladder is working. TREATMENT  An overactive bladder can be treated in many ways. The treatment will depend on the cause. Whether you have a mild or severe case also makes a difference. Often, treatment can be given in your healthcare provider's office or clinic. Be sure to discuss the different options with your caregiver. They include:  Behavioral treatments. These do not involve medication or surgery:  Bladder training. For this, you would follow a schedule to urinate at regular intervals. This helps you learn to control the urge to urinate. At first, you might be asked to wait a few minutes after feeling the urge. In time, you should be able to schedule bathroom visits an hour or more apart.  Kegel exercises. These exercises strengthen the pelvic floor muscles, which support the bladder. By toning these muscles, they can help control urination, even if the bladder muscles are overactive. A specialist will teach you how to do these exercises correctly. They will require daily practice.  Weight loss. If you are obese or overweight, losing weight might stop your bladder from being overactive. Talk to your healthcare provider about how many pounds you should lose. Also ask if there is a specific program or method that would work best for you.  Diet change. This might be suggested if constipation is making your overactive bladder worse. Your healthcare provider or a nutritionist can explain ways to change what you eat to ease constipation. Other people might need to take in less caffeine or alcohol. Sometimes drinking fewer fluids is needed, too.  Protection. This  is not an actual treatment. But, you could wear special pads to take care of any leakage while you wait for other treatments to take effect. This will help you avoid embarrassment.  Physical treatments.  Electrical stimulation. Electrodes will send gentle pulses to the nerves or muscles that help control the bladder. The goal is to strengthen them. Sometimes this is done with the electrodes outside of the body. Or, they might be placed inside the body (implanted). This treatment can take several months to have an effect.  Medications. These are usually used along with other treatments. Several medicines are available. Some are injected into the muscles involved in urination. Others come in pill form. Medications sometimes prescribed include:  Anticholinergics. These drugs block the signals that the nerves deliver to the bladder. This keeps it from releasing urine at the wrong time. Researchers think the drugs might help in other ways, too.  Imipramine. This is an antidepressant. But, it relaxes bladder muscles.  Botox. This is still experimental. Some people believe that injecting it into the bladder muscles will relax them so they work more normally. It has also been injected into the  sphincter muscle when the sphincter muscle does not open properly. This is a temporary fix, however. Also, it might make matters worse, especially in older people.  Surgery.  A device might be implanted to help manage your nerves. It works on the nerves that signal when you need to urinate.  Surgery is sometimes needed with electrical stimulation. If the electrodes are implanted, this is done through surgery.  Sometimes repairs need to be made through surgery. For example, the size of the bladder can be changed. This is usually done in severe cases only. HOME CARE INSTRUCTIONS   Take any medications your healthcare provider prescribed or suggested. Follow the directions carefully.  Practice any lifestyle  changes that are recommended. These might include:  Drinking less fluid or drinking at different times of the day. If you need to urinate often during the night, for example, you may need to stop drinking fluids early in the evening.  Cutting down on caffeine or alcohol. They can both make an overactive bladder worse. Caffeine is found in coffee, tea and sodas.  Doing Kegel exercises to strengthen muscles.  Losing weight, if that is recommended.  Eating a healthy and balanced diet. This will help you avoid constipation.  Keep a journal or a log. You might be asked to record how much you drink and when, and also when you feel the need to urinate.  Learn how to care for implants or other devices, such as pessaries. SEEK MEDICAL CARE IF:   Your overactive bladder gets worse.  You feel increased pain or irritation when you urinate.  You notice blood in your urine.  You have questions about any medications or devices that your healthcare provider recommended.  You notice blood, pus or swelling at the site of any test or treatment procedure.  You have an oral temperature above 102 F (38.9 C). SEEK IMMEDIATE MEDICAL CARE IF:  You have an oral temperature above 102 F (38.9 C), not controlled by medicine. Document Released: 06/26/2009 Document Revised: 11/22/2011 Document Reviewed: 06/26/2009 [facility] Patient Information 2014 [place].

## 2014-02-25 NOTE — Progress Notes (Signed)
Pre visit review using our clinic review tool, if applicable. No additional management support is needed unless otherwise documented below in the visit note. 

## 2014-04-04 ENCOUNTER — Other Ambulatory Visit: Payer: Self-pay | Admitting: Internal Medicine

## 2014-04-12 ENCOUNTER — Other Ambulatory Visit: Payer: Self-pay | Admitting: Internal Medicine

## 2014-04-22 ENCOUNTER — Encounter: Payer: Self-pay | Admitting: Internal Medicine

## 2014-04-22 ENCOUNTER — Ambulatory Visit (INDEPENDENT_AMBULATORY_CARE_PROVIDER_SITE_OTHER): Payer: BC Managed Care – PPO | Admitting: Internal Medicine

## 2014-04-22 VITALS — BP 106/70 | HR 69 | Temp 98.1°F | Resp 18 | Ht 62.0 in | Wt 163.0 lb

## 2014-04-22 DIAGNOSIS — F3289 Other specified depressive episodes: Secondary | ICD-10-CM

## 2014-04-22 DIAGNOSIS — R1013 Epigastric pain: Secondary | ICD-10-CM

## 2014-04-22 DIAGNOSIS — F329 Major depressive disorder, single episode, unspecified: Secondary | ICD-10-CM

## 2014-04-22 MED ORDER — OMEPRAZOLE 20 MG PO CPDR: 20.0000 mg | DELAYED_RELEASE_CAPSULE | Freq: Every day | ORAL | Status: DC

## 2014-04-22 MED ORDER — CILIDINIUM-CHLORDIAZEPOXIDE 2.5-5 MG PO CAPS: ORAL_CAPSULE | ORAL | Status: DC

## 2014-04-22 NOTE — Progress Notes (Signed)
Pre visit review using our clinic review tool, if applicable. No additional management support is needed unless otherwise documented below in the visit note. 

## 2014-04-22 NOTE — Patient Instructions (Signed)
Avoids foods high in acid such as tomatoes citrus juices, and spicy foods.  Avoid eating within two hours of lying down or before exercising.  Do not overheat.  Try smaller more frequent meals.   Consider behavioral health consultation

## 2014-04-22 NOTE — Progress Notes (Signed)
Subjective:    Patient ID: Chelsea Dyer, female    DOB: 12-May-1966, 48 y.o.   MRN: [id number]  HPI 48 year old patient who has a history of depression.  She presents today with a 3-4 days history of abdominal discomfort.  Chelsea Dyer is a sense of excess gaseousness associated with nausea.  She has had some associated headache.  There's been no fever or diarrhea or vomiting. 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  She also works a full-time job and Past Medical History  Diagnosis Date  . 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 EXOGENOUS OBESITY 06/22/2010  . DEPRESSION 03/04/2008  . ALLERGIC RHINITIS 03/04/2008  . Prolactinoma   . Edema     pedal    History   Social History  . Marital Status: Married    Spouse Name: N/A    Number of Children: N/A  . Years of Education: N/A   Occupational History  . Not on file.   Social History Main Topics  . Smoking status: Never Smoker   . Smokeless tobacco: Never Used  . Alcohol Use: No  . Drug Use: No  . Sexual Activity: Not on file   Other Topics Concern  . Not on file   Social History Narrative  . No narrative on file    Past Surgical History  Procedure Laterality Date  . Breast surgery  2004    augmentation  . Liposuction    . Nasal sinus surgery      Family History  Problem Relation Age of Onset  . Arthritis Mother     osteo    Allergies  Allergen Reactions  . Sulfamethoxazole-Trimethoprim     Current Outpatient Prescriptions on File Prior to Visit  Medication Sig Dispense Refill  . bromocriptine (PARLODEL) 2.5 MG tablet TAKE 1 TABLET BY MOUTH DAILY  30 tablet  2  . DYMISTA 137-50 MCG/ACT SUSP INSTILL 2 SPRAYS IN EACH NOSTRIL TWICE A DAY  23 g  5  . venlafaxine XR (EFFEXOR-XR) 75 MG 24 hr capsule TAKE 1 CAPSULE BY MOUTH ONCE DAILY  30 capsule  2   No current  facility-administered medications on file prior to visit.    BP 106/70  Pulse 69  Temp(Src) 98.1 F (36.7 C) (Oral)  Resp 18  Ht 5\' 2"  (1.575 m)  Wt 163 lb (73.936 kg)  BMI 29.81 kg/m2  SpO2 98%  LMP 04/11/2014       Review of Systems  Constitutional: Negative.   HENT: Negative for congestion, dental problem, hearing loss, rhinorrhea, sinus pressure, sore throat and tinnitus.   Eyes: Negative for pain, discharge and visual disturbance.  Respiratory: Negative for cough and shortness of breath.   Cardiovascular: Negative for chest pain, palpitations and leg swelling.  Gastrointestinal: Positive for nausea and abdominal pain. Negative for vomiting, diarrhea, constipation, blood in stool and abdominal distention.  Genitourinary: Negative for dysuria, urgency, frequency, hematuria, flank pain, vaginal bleeding, vaginal discharge, difficulty urinating, vaginal pain and pelvic pain.  Musculoskeletal: Negative for arthralgias, gait problem and joint swelling.  Skin: Negative for rash.  Neurological: Negative for dizziness, syncope, speech difficulty, weakness, numbness and headaches.  Hematological: Negative for adenopathy.  Psychiatric/Behavioral: Positive for dysphoric mood. Negative for behavioral problems and agitation. The patient is nervous/anxious.        Objective:   Physical Exam  Constitutional: She is oriented  to person, place, and time. She appears well-developed and well-nourished.  Tearful at times during the exam Blood pressure normal Afebrile  HENT:  Head: Normocephalic.  Right Ear: External ear normal.  Left Ear: External ear normal.  Mouth/Throat: Oropharynx is clear and moist.  Eyes: Conjunctivae and EOM are normal. Pupils are equal, round, and reactive to light.  Neck: Normal range of motion. Neck supple. No thyromegaly present.  Cardiovascular: Normal rate, regular rhythm, normal heart sounds and intact distal pulses.   Pulmonary/Chest: Effort normal and  breath sounds normal.  Abdominal: Soft. Bowel sounds are normal. She exhibits no mass. There is no tenderness.  Epigastric tenderness  Musculoskeletal: Normal range of motion.  Lymphadenopathy:    She has no cervical adenopathy.  Neurological: She is alert and oriented to person, place, and time.  Skin: Skin is warm and dry. No rash noted.  Psychiatric: She has a normal mood and affect. Her behavior is normal.          Assessment & Plan:  Abdominal pain/epigastric tenderness.  Possible gastritis.  Will place on May antireflux regimen, as well as short-term PPI therapy Anxiety depression.  Behavioral health referral discussed and encouraged  We'll place on Librax twice a day when necessary

## 2014-04-24 ENCOUNTER — Telehealth: Payer: Self-pay | Admitting: Internal Medicine

## 2014-04-24 NOTE — Telephone Encounter (Signed)
Per Dr. Raliegh Ip, medication has not had time to take effect and she should continue to use it.  If not better at the start of the week than she will need to come in and discuss other options.  Offered work note for today.  Pt stated she is going to try and go in and that she is not in pain at the moment.  Advised she call back immediately if pain worsens or new symptoms start.

## 2014-04-24 NOTE — Telephone Encounter (Signed)
Patient Information:  Caller Name: Javaeh  Phone: [phone]  Patient: Chelsea, Dyer  Gender: Female  DOB: 1966/07/11  Age: 48 Years  PCP: Bluford Kaufmann (Family Practice > 38yrs old)  Pregnant: No  Office Follow Up:  Does the office need to follow up with this patient?: Yes  Instructions For The Office: Please call and advise   Symptoms  Reason For Call & Symptoms: Pt was seen on 04/22/14 for abdominal pain /cramping. See Epic notes from then. She had the abdominal discomfort for 3-4 days prior . Pt has taken prescribed medications from that date (Librax and PRilosec) and has not experienced any relief. She wants to Know what next step should be. Pt describes her pain as intermittent; a 6 out of a 10 and wants to know if she should work today or not (shift starts at 53). There is no vomiting/diarrhea or constipation.  Reviewed Health History In EMR: N/A  Reviewed Medications In EMR: N/A  Reviewed Allergies In EMR: N/A  Reviewed Surgeries / Procedures: N/A  Date of Onset of Symptoms: 04/18/2014  Treatments Tried: Librax 1 tab BID ; Prilosec 1 tab daily  Treatments Tried Worked: No OB / GYN:  LMP: Unknown  Guideline(s) Used:  Abdominal Pain - Female  Abdominal Pain - Upper  Disposition Per Guideline:   See Today or Tomorrow in Office  Reason For Disposition Reached:   Mild pain that comes and goes (cramps) lasts > 24 hours  Advice Given:  N/A  RN Overrode Recommendation:  Follow Up With Office Later  Pt wanting to know what the f/u would be.

## 2014-05-24 ENCOUNTER — Other Ambulatory Visit: Payer: Self-pay | Admitting: Internal Medicine

## 2014-05-28 ENCOUNTER — Telehealth: Payer: Self-pay | Admitting: Internal Medicine

## 2014-05-28 NOTE — Telephone Encounter (Signed)
Pt called to ask if Dr Raliegh Ip will rx her a valium. She did not know the name of the med but said Dr Raliegh Ip would.

## 2014-05-29 NOTE — Telephone Encounter (Signed)
Spoke to pt, told her the Librax medication she is already taking is like Valium and you may use twice a day as needed per Dr. Raliegh Ip. Pt verbalized understanding.

## 2014-05-29 NOTE — Telephone Encounter (Signed)
Notify patient that Librax is a Valium-like medication and she may use this medication twice daily as needed

## 2014-05-29 NOTE — Telephone Encounter (Signed)
Please see message, pt is already on Librax 5-2.5 mg twice a day. Please advise

## 2014-06-03 ENCOUNTER — Telehealth: Payer: Self-pay | Admitting: Internal Medicine

## 2014-06-03 NOTE — Telephone Encounter (Signed)
Pt would like to increase her venlafaxine XR (EFFEXOR-XR) 150 MG 24 hr capsule . Pt states not strong enough. Offered pt appt but Pt refused to make appointment.   Pt states the anxiety pills she got last knocks her out and she cannot take while working. Pt states dr Raliegh Ip knows what she is going through. Pt states she cannot go to counseling right now due to time and money. pls advise. [facility]

## 2014-06-03 NOTE — Telephone Encounter (Signed)
Spoke to pt, told her Dr.K said Venlafaxine 150 mg XR and 75 mg XR that she is already taking is the maximum dose and  can not increase. Pt verbalized understanding and said the Librax that he prescribed is too strong. Told pt she can try to take half a tablet and see how that works. Pt verbalized understanding.

## 2014-06-03 NOTE — Telephone Encounter (Signed)
OK to increase to 225 XR once daily

## 2014-06-03 NOTE — Telephone Encounter (Signed)
Discussed with Dr. Raliegh Ip that pt is on Venlafaxine XR 150 mg and 75 mg XR already taking 225 mg XR. Dr.K said that is the maximum dose can not increase.

## 2014-06-03 NOTE — Telephone Encounter (Signed)
Please see message and advise 

## 2014-06-16 ENCOUNTER — Other Ambulatory Visit: Payer: Self-pay | Admitting: Internal Medicine

## 2014-06-23 ENCOUNTER — Other Ambulatory Visit: Payer: Self-pay | Admitting: Internal Medicine

## 2014-08-02 ENCOUNTER — Other Ambulatory Visit: Payer: Self-pay

## 2014-08-02 DIAGNOSIS — Z1231 Encounter for screening mammogram for malignant neoplasm of breast: Secondary | ICD-10-CM

## 2014-08-24 ENCOUNTER — Other Ambulatory Visit: Payer: Self-pay | Admitting: Internal Medicine

## 2014-08-27 ENCOUNTER — Ambulatory Visit (INDEPENDENT_AMBULATORY_CARE_PROVIDER_SITE_OTHER): Payer: BC Managed Care – PPO | Admitting: Family

## 2014-08-27 ENCOUNTER — Telehealth: Payer: Self-pay | Admitting: Internal Medicine

## 2014-08-27 ENCOUNTER — Encounter: Payer: Self-pay | Admitting: Family

## 2014-08-27 VITALS — BP 124/78 | HR 87 | Temp 97.0°F | Wt 170.5 lb

## 2014-08-27 DIAGNOSIS — F411 Generalized anxiety disorder: Secondary | ICD-10-CM

## 2014-08-27 DIAGNOSIS — R42 Dizziness and giddiness: Secondary | ICD-10-CM

## 2014-08-27 DIAGNOSIS — F32A Depression, unspecified: Secondary | ICD-10-CM

## 2014-08-27 DIAGNOSIS — D352 Benign neoplasm of pituitary gland: Secondary | ICD-10-CM

## 2014-08-27 DIAGNOSIS — F329 Major depressive disorder, single episode, unspecified: Secondary | ICD-10-CM

## 2014-08-27 LAB — BASIC METABOLIC PANEL
BUN: 15 mg/dL (ref 6–23)
CALCIUM: 9.4 mg/dL (ref 8.4–10.5)
CO2: 23 mEq/L (ref 19–32)
CREATININE: 0.7 mg/dL (ref 0.4–1.2)
Chloride: 106 mEq/L (ref 96–112)
GFR: 93.29 mL/min (ref >60.00)
Glucose, Bld: 95 mg/dL (ref 70–99)
Potassium: 3.9 mEq/L (ref 3.5–5.1)
Sodium: 134 mEq/L — ABNORMAL LOW (ref 135–145)

## 2014-08-27 LAB — CBC WITH DIFFERENTIAL/PLATELET
BASOS ABS: 0 10*3/uL (ref 0.0–0.1)
Basophils Relative: 0.2 % (ref 0.0–3.0)
EOS PCT: 1.9 % (ref 0.0–5.0)
Eosinophils Absolute: 0.2 10*3/uL (ref 0.0–0.7)
HEMATOCRIT: 43.8 % (ref 36.0–46.0)
HEMOGLOBIN: 14.2 g/dL (ref 12.0–15.0)
LYMPHS ABS: 1.3 10*3/uL (ref 0.7–4.0)
Lymphocytes Relative: 14.1 % (ref 12.0–46.0)
MCHC: 32.5 g/dL (ref 30.0–36.0)
MCV: 91.8 fl (ref 78.0–100.0)
Monocytes Absolute: 0.5 10*3/uL (ref 0.1–1.0)
Monocytes Relative: 5.7 % (ref 3.0–12.0)
NEUTROS ABS: 7 10*3/uL (ref 1.4–7.7)
Neutrophils Relative %: 78.1 % — ABNORMAL HIGH (ref 43.0–77.0)
Platelets: 305 10*3/uL (ref 150.0–400.0)
RBC: 4.78 Mil/uL (ref 3.87–5.11)
RDW: 13.7 % (ref 11.5–15.5)
WBC: 9 10*3/uL (ref 4.0–10.5)

## 2014-08-27 LAB — POCT URINALYSIS DIPSTICK
Bilirubin, UA: NEGATIVE
Glucose, UA: NEGATIVE
Ketones, UA: NEGATIVE
Leukocytes, UA: NEGATIVE
Nitrite, UA: NEGATIVE
PH UA: 5.5
Protein, UA: NEGATIVE
RBC UA: NEGATIVE
Spec Grav, UA: 1.02
UROBILINOGEN UA: 0.2

## 2014-08-27 LAB — TSH: TSH: 1.71 u[IU]/mL (ref 0.35–4.50)

## 2014-08-27 LAB — POCT URINE PREGNANCY: Preg Test, Ur: NEGATIVE

## 2014-08-27 NOTE — Patient Instructions (Signed)
Stress and Stress Management Stress is a normal reaction to life events. It is what you feel when life demands more than you are used to or more than you can handle. Some stress can be useful. For example, the stress reaction can help you catch the last bus of the day, study for a test, or meet a deadline at work. But stress that occurs too often or for too long can cause problems. It can affect your emotional health and interfere with relationships and normal daily activities. Too much stress can weaken your immune system and increase your risk for physical illness. If you already have a medical problem, stress can make it worse. CAUSES  All sorts of life events may cause stress. An event that causes stress for one person may not be stressful for another person. Major life events commonly cause stress. These may be positive or negative. Examples include losing your job, moving into a new home, getting married, having a baby, or losing a loved one. Less obvious life events may also cause stress, especially if they occur day after day or in combination. Examples include working long hours, driving in traffic, caring for children, being in debt, or being in a difficult relationship. SIGNS AND SYMPTOMS Stress may cause emotional symptoms including, the following:  Anxiety. This is feeling worried, afraid, on edge, overwhelmed, or out of control.  Anger. This is feeling irritated or impatient.  Depression. This is feeling sad, down, helpless, or guilty.  Difficulty focusing, remembering, or making decisions. Stress may cause physical symptoms, including the following:   Aches and pains. These may affect your head, neck, back, stomach, or other areas of your body.  Tight muscles or clenched jaw.  Low energy or trouble sleeping. Stress may cause unhealthy behaviors, including the following:   Eating to feel better (overeating) or skipping meals.  Sleeping too little, too much, or both.  Working  too much or putting off tasks (procrastination).  Smoking, drinking alcohol, or using drugs to feel better. DIAGNOSIS  Stress is diagnosed through an assessment by your health care provider. Your health care provider will ask questions about your symptoms and any stressful life events.Your health care provider will also ask about your medical history and may order blood tests or other tests. Certain medical conditions and medicine can cause physical symptoms similar to stress. Mental illness can cause emotional symptoms and unhealthy behaviors similar to stress. Your health care provider may refer you to a mental health professional for further evaluation.  TREATMENT  Stress management is the recommended treatment for stress.The goals of stress management are reducing stressful life events and coping with stress in healthy ways.  Techniques for reducing stressful life events include the following:  Stress identification. Self-monitor for stress and identify what causes stress for you. These skills may help you to avoid some stressful events.  Time management. Set your priorities, keep a calendar of events, and learn to say "no." These tools can help you avoid making too many commitments. Techniques for coping with stress include the following:  Rethinking the problem. Try to think realistically about stressful events rather than ignoring them or overreacting. Try to find the positives in a stressful situation rather than focusing on the negatives.  Exercise. Physical exercise can release both physical and emotional tension. The key is to find a form of exercise you enjoy and do it regularly.  Relaxation techniques. These relax the body and mind. Examples include yoga, meditation, tai chi, biofeedback, deep  breathing, progressive muscle relaxation, listening to music, being out in nature, journaling, and other hobbies. Again, the key is to find one or more that you enjoy and can do  regularly.  Healthy lifestyle. Eat a balanced diet, get plenty of sleep, and do not smoke. Avoid using alcohol or drugs to relax.  Strong support network. Spend time with family, friends, or other people you enjoy being around.Express your feelings and talk things over with someone you trust. Counseling or talktherapy with a mental health professional may be helpful if you are having difficulty managing stress on your own. Medicine is typically not recommended for the treatment of stress.Talk to your health care provider if you think you need medicine for symptoms of stress. HOME CARE INSTRUCTIONS  Keep all follow-up visits as directed by your health care provider.  Take all medicines as directed by your health care provider. SEEK MEDICAL CARE IF:  Your symptoms get worse or you start having new symptoms.  You feel overwhelmed by your problems and can no longer manage them on your own. SEEK IMMEDIATE MEDICAL CARE IF:  You feel like hurting yourself or someone else. Document Released: 02/23/2001 Document Revised: 01/14/2014 Document Reviewed: 04/24/2013 [employer] Patient Information 2015 [employer]. This information is not intended to replace advice given to you by your health care provider. Make sure you discuss any questions you have with your health care provider.  

## 2014-08-27 NOTE — Progress Notes (Signed)
Pre visit review using our clinic review tool, if applicable. No additional management support is needed unless otherwise documented below in the visit note. 

## 2014-08-27 NOTE — Telephone Encounter (Signed)
[facility] - Client [facility] Patient Name: Chelsea Dyer Gender: Female DOB: May 15, 1966 Age: 48 Y 78 M Return Phone Number: [phone] (Primary) Address: [place]. City/State/Zip: [place] [address] Client [facility] - Client Client Site [facility] Day Physician Simonne Martinet Contact Type Call Call Type Triage / Clinical Relationship To Patient Self Return Phone Number [phone] (Primary) Chief Complaint Vision - (non urgent symptoms) Initial Comment Caller states c/o blurry vision, dizziness, clouded thinking - has a pituitary tumor PreDisposition InappropriateToAsk Nurse Assessment Nurse: Gretta Cool, RN, Byrd Hesselbach Date/Time Eilene Ghazi Time): 08/27/2014 9:28:17 AM Confirm and document reason for call. If symptomatic, describe symptoms. ---Caller states c/o blurry vision, dizziness, clouded thinking - has a pituitary tumor. Sx started yesterday. Has the patient traveled out of the country within the last 30 days? ---Not Applicable Does the patient require triage? ---Yes Related visit to physician within the last 2 weeks? ---No Does the PT have any chronic conditions? (i.e. diabetes, asthma, etc.) ---Yes List chronic conditions. ---depression, pituitary tumor Did the patient indicate they were pregnant? ---No Guidelines Guideline Title Affirmed Question Affirmed Notes Nurse Date/Time (Eastern Time) Dizziness - Lightheadedness [1] MODERATE dizziness (e.g., interferes with normal activities) AND [2] has NOT been evaluated by physician for this (Exception: dizziness caused by heat exposure, sudden standing, or poor fluid intake) Gretta Cool, RN, Byrd Hesselbach 08/27/2014 9:29:47 AM Disp. Time Eilene Ghazi Time) Disposition Final User 08/27/2014 8:48:57 AM Send To Clinical Follow Up Rich Brave, Amy 08/27/2014 9:06:28 AM Attempt made - message left Gretta Cool,  RN, Byrd Hesselbach 08/27/2014 9:35:17 AM See Physician within 24 Hours Yes Gretta Cool, RN, Byrd Hesselbach PLEASE NOTE: All timestamps contained within this report are represented as [place] Standard Time. CONFIDENTIALTY NOTICE: This fax transmission is intended only for the addressee. It contains information that is legally privileged, confidential or otherwise protected from use or disclosure. If you are not the intended recipient, you are strictly prohibited from reviewing, disclosing, copying using or disseminating any of this information or taking any action in reliance on or regarding this information. If you have received this fax in error, please notify us immediately by telephone so that we can arrange for its return to [place]. Phone: [phone], Toll-Free: [phone], Fax: [phone] Page: 2 of 2 Call Id: [id number] Caller Understands: Yes Disagree/Comply: Comply Care Advice Given Per Guideline SEE PHYSICIAN WITHIN 24 HOURS: * IF OFFICE WILL BE OPEN: You need to be examined within the next 24 hours. Call your doctor when the office opens, and make an appointment. FLUIDS: Drink several glasses of fruit juice, other clear fluids or water. This will improve hydration and blood glucose. If the weather is hot, make sure the fluids are cold. REST: Lie down with feet elevated for 1 hour. This will improve circulation and increase blood flow to the brain. CALL BACK IF: * Passes out (faints) * You become worse. CARE ADVICE given per Dizziness (Adult) guideline. After Care Instructions Given Call Event Type User Date / Time Description Comments User: Byrd Hesselbach, Gretta Cool, RN Date/Time Eilene Ghazi Time): 08/27/2014 9:36:06 AM Spoke w/Cindy at the office and appt made for pt today at 2:30. Caller notified and verbalized understanding. Referrals REFERRED TO PCP OFFICE

## 2014-08-27 NOTE — Progress Notes (Signed)
Subjective:    Patient ID: Chelsea Dyer, female    DOB: Nov 01, 1965, 48 y.o.   MRN: [id number]  HPI 48 year old white female, nonsmoker with a history of depression and anxiety is in today with complaints of feeling lightheaded, dizzy, blurred vision, diarrhea that began yesterday into this morning. Today, she feels about 95% better. Reports possibly missing a dose of one of her antidepressants. Also has been off of Parlodel for her pituitary gland for several months. Reports increased stress with her mother residing with her and does not have a job, her husband she recently found out is 42 years older than she ever knew he was. Her asthma and also has been recently diagnosed with Alzheimer's disease and Parkinson's. She has a daughter with Down syndrome. Her dog recently died. Reports having financial stressors and not getting any help from her husband despite him being in a financially stable position. She typically exercises twice a week. She's currently on a menstrual cycle.   Review of Systems  Constitutional: Negative.   HENT: Negative.   Eyes: Negative.   Respiratory: Negative.   Gastrointestinal: Positive for diarrhea. Negative for nausea and vomiting.  Endocrine: Negative.   Genitourinary: Negative.   Musculoskeletal: Negative.   Skin: Negative.   Allergic/Immunologic: Negative.   Neurological: Positive for dizziness and light-headedness.  Hematological: Negative.   Psychiatric/Behavioral: Positive for agitation. The patient is nervous/anxious.    Past Medical History  Diagnosis Date  . 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 EXOGENOUS OBESITY 06/22/2010  . DEPRESSION 03/04/2008  . ALLERGIC RHINITIS 03/04/2008  . Prolactinoma   . Edema     pedal    History   Social History  . Marital Status: Married    Spouse Name: N/A    Number of Children: N/A  . Years of Education: N/A   Occupational History  . Not on file.   Social History Main Topics  . Smoking status: Never  Smoker   . Smokeless tobacco: Never Used  . Alcohol Use: No  . Drug Use: No  . Sexual Activity: Not on file   Other Topics Concern  . Not on file   Social History Narrative    Past Surgical History  Procedure Laterality Date  . Breast surgery  2004    augmentation  . Liposuction    . Nasal sinus surgery      Family History  Problem Relation Age of Onset  . Arthritis Mother     osteo    Allergies  Allergen Reactions  . Sulfamethoxazole-Trimethoprim     Current Outpatient Prescriptions on File Prior to Visit  Medication Sig Dispense Refill  . clidinium-chlordiazePOXIDE (LIBRAX) 5-2.5 MG per capsule 1 tablet twice daily 60 capsule 3  . DYMISTA 137-50 MCG/ACT SUSP INSTILL 2 SPRAYS IN EACH NOSTRIL TWICE A DAY 23 g 5  . ibuprofen (ADVIL) 200 MG tablet Take 800 mg by mouth every 8 (eight) hours as needed.    Marland Kitchen omeprazole (PRILOSEC) 20 MG capsule Take 1 capsule (20 mg total) by mouth daily. 30 capsule 3  . venlafaxine XR (EFFEXOR-XR) 150 MG 24 hr capsule TAKE 1 CAPSULE BY MOUTH DAILY 30 capsule 1  . venlafaxine XR (EFFEXOR-XR) 75 MG 24 hr capsule TAKE 1 CAPSULE BY MOUTH ONCE DAILY 30 capsule 2  . bromocriptine (PARLODEL) 2.5 MG tablet TAKE 1 TABLET BY MOUTH DAILY (Patient not taking: Reported on 08/27/2014) 30 tablet 2   No current facility-administered medications on file prior to visit.    BP 124/78  mmHg  Pulse 87  Temp(Src) 97 F (36.1 C) (Oral)  Wt 170 lb 8 oz (77.338 kg)chart    Objective:   Physical Exam  Constitutional: She is oriented to person, place, and time. She appears well-developed and well-nourished.  HENT:  Right Ear: External ear normal.  Left Ear: External ear normal.  Nose: Nose normal.  Mouth/Throat: Oropharynx is clear and moist.  Neck: Normal range of motion. Neck supple. No thyromegaly present.  Cardiovascular: Normal rate, regular rhythm and normal heart sounds.   Pulmonary/Chest: Effort normal and breath sounds normal.  Abdominal: Soft.  Bowel sounds are normal.  Musculoskeletal: Normal range of motion.  Neurological: She is alert and oriented to person, place, and time.  Skin: Skin is warm and dry.  Psychiatric: She has a normal mood and affect.          Assessment & Plan:  Bryttany was seen today for dizziness.  Diagnoses and associated orders for this visit:  Dizziness and giddiness - Prolactin - PTH, Intact and Calcium - TSH - CBC with Differential - Basic Metabolic Panel - POC Urinalysis Dipstick - POCT urine pregnancy  Depression - Prolactin - PTH, Intact and Calcium - TSH - CBC with Differential - Basic Metabolic Panel - POC Urinalysis Dipstick - POCT urine pregnancy  Generalized anxiety disorder - Prolactin - PTH, Intact and Calcium - TSH - CBC with Differential - Basic Metabolic Panel - POC Urinalysis Dipstick - POCT urine pregnancy  Pituitary adenoma - Prolactin - PTH, Intact and Calcium - TSH - CBC with Differential - Basic Metabolic Panel - POC Urinalysis Dipstick - POCT urine pregnancy    I have obtain labs today to see if she has a parathyroid hormone deficiency given she's been off of her medications. I strongly believe her symptoms are related to anxiety and panic related to stress. Patient is much better now. Encouraged exercise and psychotherapy.

## 2014-08-28 LAB — PROLACTIN: Prolactin: 9 ng/mL

## 2014-08-28 LAB — PTH, INTACT AND CALCIUM
Calcium: 9.4 mg/dL (ref 8.4–10.5)
PTH: 43 pg/mL (ref 14–64)

## 2014-09-03 ENCOUNTER — Telehealth: Payer: Self-pay | Admitting: Internal Medicine

## 2014-09-03 NOTE — Telephone Encounter (Signed)
Pt states the numbers in mychart are foreign to her and she would like a cb to help her understand the results. pls cb

## 2014-09-04 NOTE — Telephone Encounter (Signed)
Left message on personally identified voicemail to advise pt labs were normal

## 2014-09-09 ENCOUNTER — Ambulatory Visit
Admission: RE | Admit: 2014-09-09 | Discharge: 2014-09-09 | Disposition: A | Discharge status: Home / Self Care | Payer: BC Managed Care – PPO | Source: Ambulatory Visit

## 2014-09-09 DIAGNOSIS — Z1231 Encounter for screening mammogram for malignant neoplasm of breast: Secondary | ICD-10-CM

## 2014-10-16 ENCOUNTER — Other Ambulatory Visit: Payer: Self-pay | Admitting: Internal Medicine

## 2014-11-12 ENCOUNTER — Other Ambulatory Visit: Payer: Self-pay | Admitting: Internal Medicine

## 2014-11-15 ENCOUNTER — Telehealth: Payer: Self-pay | Admitting: Internal Medicine

## 2014-11-15 NOTE — Telephone Encounter (Signed)
Please see message and advise 

## 2014-11-15 NOTE — Telephone Encounter (Signed)
Pt has had some brown spotting right before her period starts and wants to know if this is a sign of menopause. Pt states dr Raliegh Ip is her obgyn and if she needs to make appt, she will do that.

## 2014-11-15 NOTE — Telephone Encounter (Signed)
Spoke to pt, told her yes, some changes in the frequency and duration of the menstrual period can be an early sign of menopause, monitor periods. Pt verbalized understanding.

## 2014-11-15 NOTE — Telephone Encounter (Signed)
Yes, some changes in the frequency and duration of the menstrual period can be an early sign of menopause.

## 2014-11-21 ENCOUNTER — Encounter: Payer: Self-pay | Admitting: Internal Medicine

## 2014-11-21 ENCOUNTER — Ambulatory Visit (INDEPENDENT_AMBULATORY_CARE_PROVIDER_SITE_OTHER): Payer: Managed Care, Other (non HMO) | Admitting: Internal Medicine

## 2014-11-21 VITALS — BP 122/70 | HR 71 | Temp 98.1°F | Resp 20 | Ht 62.0 in | Wt 173.0 lb

## 2014-11-21 DIAGNOSIS — J069 Acute upper respiratory infection, unspecified: Secondary | ICD-10-CM

## 2014-11-21 DIAGNOSIS — F4323 Adjustment disorder with mixed anxiety and depressed mood: Secondary | ICD-10-CM

## 2014-11-21 DIAGNOSIS — J3089 Other allergic rhinitis: Secondary | ICD-10-CM

## 2014-11-21 MED ORDER — HYDROCODONE-HOMATROPINE 5-1.5 MG/5ML PO SYRP: 5.0000 mL | ORAL_SOLUTION | Freq: Four times a day (QID) | ORAL | Status: DC | PRN

## 2014-11-21 NOTE — Progress Notes (Signed)
Subjective:    Patient ID: Chelsea Dyer, female    DOB: 1966/07/21, 49 y.o.   MRN: [id number]  HPI  49 year old patient who presents with a one-week history of increasing sore throat, nasal congestion, drainage, fullness in the ear as well as cough.  She has a history of allergic rhinitis and has been on maintenance medications  She also has a history of anxiety, depression.  She continues to have significant stressors with the poor health of her mother, who now lives in the same household, as well as health issues with her husband in a child with Down syndrome.  She continues to work, but this actually has been a nice distraction.  Past Medical History  Diagnosis Date  . 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 EXOGENOUS OBESITY 06/22/2010  . DEPRESSION 03/04/2008  . ALLERGIC RHINITIS 03/04/2008  . Prolactinoma   . Edema     pedal    History   Social History  . Marital Status: Married    Spouse Name: N/A  . Number of Children: N/A  . Years of Education: N/A   Occupational History  . Not on file.   Social History Main Topics  . Smoking status: Never Smoker   . Smokeless tobacco: Never Used  . Alcohol Use: No  . Drug Use: No  . Sexual Activity: Not on file   Other Topics Concern  . Not on file   Social History Narrative    Past Surgical History  Procedure Laterality Date  . Breast surgery  2004    augmentation  . Liposuction    . Nasal sinus surgery      Family History  Problem Relation Age of Onset  . Arthritis Mother     osteo    Allergies  Allergen Reactions  . Sulfamethoxazole-Trimethoprim     Current Outpatient Prescriptions on File Prior to Visit  Medication Sig Dispense Refill  . DYMISTA 137-50 MCG/ACT SUSP INSTILL 2 SPRAYS IN EACH NOSTRIL TWICE A DAY 23 g 5  . ibuprofen (ADVIL) 200 MG tablet Take 800 mg by mouth every 8 (eight) hours as needed.    . venlafaxine XR (EFFEXOR-XR) 150 MG 24 hr capsule TAKE 1 CAPSULE BY MOUTH DAILY 30 capsule 3  .  venlafaxine XR (EFFEXOR-XR) 75 MG 24 hr capsule TAKE 1 CAPSULE BY MOUTH ONCE DAILY 30 capsule 2   No current facility-administered medications on file prior to visit.    BP 122/70 mmHg  Pulse 71  Temp(Src) 98.1 F (36.7 C) (Oral)  Resp 20  Ht 5\' 2"  (1.575 m)  Wt 173 lb (78.472 kg)  BMI 31.63 kg/m2  SpO2 98%  LMP 11/18/2014     Review of Systems  Constitutional: Positive for fatigue.  HENT: Positive for congestion, ear pain, postnasal drip, rhinorrhea and sore throat. Negative for dental problem, hearing loss, sinus pressure and tinnitus.   Eyes: Negative for pain, discharge and visual disturbance.  Respiratory: Positive for cough. Negative for shortness of breath.   Cardiovascular: Negative for chest pain, palpitations and leg swelling.  Gastrointestinal: Negative for nausea, vomiting, abdominal pain, diarrhea, constipation, blood in stool and abdominal distention.  Genitourinary: Negative for dysuria, urgency, frequency, hematuria, flank pain, vaginal bleeding, vaginal discharge, difficulty urinating, vaginal pain and pelvic pain.  Musculoskeletal: Negative for joint swelling, arthralgias and gait problem.  Skin: Negative for rash.  Neurological: Negative for dizziness, syncope, speech difficulty, weakness, numbness and headaches.  Hematological: Negative for adenopathy.  Psychiatric/Behavioral: Negative for behavioral problems, dysphoric mood and  agitation. The patient is not nervous/anxious.        Objective:   Physical Exam  Constitutional: She is oriented to person, place, and time. She appears well-developed and well-nourished.  HENT:  Head: Normocephalic.  Right Ear: External ear normal.  Left Ear: External ear normal.  Oral pharynx minimally injected  Eyes: Conjunctivae and EOM are normal. Pupils are equal, round, and reactive to light.  Neck: Normal range of motion. Neck supple. No thyromegaly present.  Cardiovascular: Normal rate, regular rhythm, normal heart  sounds and intact distal pulses.   Pulmonary/Chest: Effort normal and breath sounds normal.  Abdominal: Soft. Bowel sounds are normal. She exhibits no mass. There is no tenderness.  Musculoskeletal: Normal range of motion.  Lymphadenopathy:    She has no cervical adenopathy.  Neurological: She is alert and oriented to person, place, and time.  Skin: Skin is warm and dry. No rash noted.  Psychiatric: She has a normal mood and affect. Her behavior is normal.          Assessment & Plan:

## 2014-11-21 NOTE — Patient Instructions (Signed)
Behavioral health follow-up as discussed  Acute sinusitis symptoms for less than 10 days are generally not helped by antibiotic therapy.  Use saline irrigation, warm  moist compresses and over-the-counter decongestants only as directed.  Call if there is no improvement in 5 to 7 days, or sooner if you develop increasing pain, fever, or any new symptoms.  Most cases of acute sinusitis are related to a viral infection and will resolve on their own within 10 days. Sometimes medicines are prescribed to help relieve symptoms (pain medicine, decongestants, nasal steroid sprays, or saline sprays).   HOME CARE INSTRUCTIONS   Drink plenty of water. Water helps thin the mucus so your sinuses can drain more easily.  Use a humidifier.  Inhale steam 3 to 4 times a day (for example, sit in the bathroom with the shower running).  Apply a warm, moist washcloth to your face 3 to 4 times a day, or as directed by your health care provider.  Use saline nasal sprays to help moisten and clean your sinuses.  Take medicines only as directed by your health care provider.   SEEK IMMEDIATE MEDICAL CARE IF:  You have increasing pain or severe headaches.  You have nausea, vomiting, or drowsiness.  You have swelling around your face.  You have vision problems.  You have a stiff neck.  You have difficulty breathing.

## 2014-11-21 NOTE — Progress Notes (Signed)
Pre visit review using our clinic review tool, if applicable. No additional management support is needed unless otherwise documented below in the visit note. 

## 2014-11-25 ENCOUNTER — Telehealth: Payer: Self-pay | Admitting: Internal Medicine

## 2014-11-25 MED ORDER — AMOXICILLIN-POT CLAVULANATE 875-125 MG PO TABS: 1.0000 | ORAL_TABLET | Freq: Two times a day (BID) | ORAL | Status: DC

## 2014-11-25 NOTE — Telephone Encounter (Signed)
Left detailed message on personal voicemail, Rx sent to pharmacy and also Dr.K suggested Afrin nasal spray twice daily, call if any questions.

## 2014-11-25 NOTE — Telephone Encounter (Signed)
Please see message and advise 

## 2014-11-25 NOTE — Telephone Encounter (Signed)
Suggest Afrin nasal spray twice daily Augmentin 875 #14 one twice a day with meals.  Please call in a new prescription

## 2014-11-25 NOTE — Telephone Encounter (Signed)
Pt said the cough medicine is not helping her. She said she coughed all night. She said she also has pressure in her face and eyes. She is asking if she need a antibiotic.    Pharmacy [facility]

## 2014-11-27 MED ORDER — FLUCONAZOLE 150 MG PO TABS: 150.0000 mg | ORAL_TABLET | Freq: Once | ORAL | Status: DC

## 2014-11-27 NOTE — Telephone Encounter (Signed)
Ok  150 mg  #1 

## 2014-11-27 NOTE — Telephone Encounter (Signed)
Please advise if okay to send in Diflucan for yeast infection?

## 2014-11-27 NOTE — Addendum Note (Signed)
Addended by: Marian Sorrow on: 11/27/2014 12:22 PM   Modules accepted: Orders

## 2014-11-27 NOTE — Telephone Encounter (Signed)
Left detailed message on personal voicemail Rx sent to pharmacy for Diflucan.

## 2014-11-27 NOTE — Telephone Encounter (Signed)
Patient states she now has a yeast infection after taking the anti-biotic and would like something sent to [facility], [address]

## 2014-12-16 ENCOUNTER — Telehealth: Payer: Self-pay | Admitting: Internal Medicine

## 2014-12-16 NOTE — Telephone Encounter (Signed)
Discussed with Dr.K and he said can not call anything in need to try OTC medications such as Zyrtec, Claritin and Flonase.

## 2014-12-16 NOTE — Telephone Encounter (Signed)
Pt is on vacation in [place] and would like something call into [facility]  Phone  [phone] address [address]  for her allergies

## 2014-12-16 NOTE — Telephone Encounter (Signed)
Spoke to pt, told her discussed with Dr.K and he said can not call anything in need to try OTC medications such as Zyrtec, Claritin and Flonase. Pt verbalized understanding.

## 2015-01-13 ENCOUNTER — Encounter: Payer: Self-pay | Admitting: Internal Medicine

## 2015-01-13 ENCOUNTER — Ambulatory Visit (INDEPENDENT_AMBULATORY_CARE_PROVIDER_SITE_OTHER): Payer: Managed Care, Other (non HMO) | Admitting: Internal Medicine

## 2015-01-13 VITALS — BP 110/70 | HR 65 | Temp 97.7°F | Resp 20 | Ht 62.0 in | Wt 170.0 lb

## 2015-01-13 DIAGNOSIS — N924 Excessive bleeding in the premenopausal period: Secondary | ICD-10-CM | POA: Diagnosis not present

## 2015-01-13 DIAGNOSIS — N938 Other specified abnormal uterine and vaginal bleeding: Secondary | ICD-10-CM

## 2015-01-13 DIAGNOSIS — D352 Benign neoplasm of pituitary gland: Secondary | ICD-10-CM | POA: Diagnosis not present

## 2015-01-13 DIAGNOSIS — D353 Benign neoplasm of craniopharyngeal duct: Secondary | ICD-10-CM

## 2015-01-13 MED ORDER — VENLAFAXINE HCL ER 150 MG PO CP24: 150.0000 mg | ORAL_CAPSULE | Freq: Every day | ORAL | Status: DC

## 2015-01-13 MED ORDER — FLUTICASONE PROPIONATE 50 MCG/ACT NA SUSP: 2.0000 | Freq: Every day | NASAL | Status: DC

## 2015-01-13 MED ORDER — VENLAFAXINE HCL ER 75 MG PO CP24: 75.0000 mg | ORAL_CAPSULE | Freq: Every day | ORAL | Status: DC

## 2015-01-13 NOTE — Progress Notes (Signed)
Pre visit review using our clinic review tool, if applicable. No additional management support is needed unless otherwise documented below in the visit note. 

## 2015-01-13 NOTE — Progress Notes (Signed)
   Subjective:    Patient ID: Chelsea Dyer, female    DOB: Jan 14, 1966, 49 y.o.   MRN: [id number]  HPI  49 year old patient who presents with a two-month history of heavy menstrual flow with longer duration of her menses.  Denies any significant pain with her periods. Patient had had a normal Pap in September 2013.  Past Medical History  Diagnosis Date  . 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 EXOGENOUS OBESITY 06/22/2010  . DEPRESSION 03/04/2008  . ALLERGIC RHINITIS 03/04/2008  . Prolactinoma   . Edema     pedal    History   Social History  . Marital Status: Married    Spouse Name: N/A  . Number of Children: N/A  . Years of Education: N/A   Occupational History  . Not on file.   Social History Main Topics  . Smoking status: Never Smoker   . Smokeless tobacco: Never Used  . Alcohol Use: No  . Drug Use: No  . Sexual Activity: Not on file   Other Topics Concern  . Not on file   Social History Narrative    Past Surgical History  Procedure Laterality Date  . Breast surgery  2004    augmentation  . Liposuction    . Nasal sinus surgery      Family History  Problem Relation Age of Onset  . Arthritis Mother     osteo    Allergies  Allergen Reactions  . Sulfamethoxazole-Trimethoprim     Current Outpatient Prescriptions on File Prior to Visit  Medication Sig Dispense Refill  . ibuprofen (ADVIL) 200 MG tablet Take 800 mg by mouth every 8 (eight) hours as needed.     No current facility-administered medications on file prior to visit.    BP 110/70 mmHg  Pulse 65  Temp(Src) 97.7 F (36.5 C) (Oral)  Resp 20  Ht 5\' 2"  (1.575 m)  Wt 170 lb (77.111 kg)  BMI 31.09 kg/m2  SpO2 97%  LMP 01/10/2015     Review of Systems  Constitutional: Negative.   HENT: Negative for congestion, dental problem, hearing loss, rhinorrhea, sinus pressure, sore throat and tinnitus.   Eyes: Negative for pain, discharge and visual disturbance.  Respiratory: Negative for cough and  shortness of breath.   Cardiovascular: Negative for chest pain, palpitations and leg swelling.  Gastrointestinal: Negative for nausea, vomiting, abdominal pain, diarrhea, constipation, blood in stool and abdominal distention.  Genitourinary: Positive for vaginal bleeding. Negative for dysuria, urgency, frequency, hematuria, flank pain, vaginal discharge, difficulty urinating, vaginal pain and pelvic pain.  Musculoskeletal: Negative for joint swelling, arthralgias and gait problem.  Skin: Negative for rash.  Neurological: Negative for dizziness, syncope, speech difficulty, weakness, numbness and headaches.  Hematological: Negative for adenopathy.  Psychiatric/Behavioral: Negative for behavioral problems, dysphoric mood and agitation. The patient is not nervous/anxious.        Objective:   Physical Exam  Constitutional: She appears well-developed and well-nourished. No distress.  Cardiovascular: Regular rhythm.           Assessment & Plan:   Perimenopausal menorrhagia.  Will set up for gynecologic evaluation.  Will check CBC Daily iron therapy recommended

## 2015-01-13 NOTE — Patient Instructions (Signed)
Gynecology evaluation as discussed Take an iron supplement daily

## 2015-01-14 LAB — CBC WITH DIFFERENTIAL/PLATELET
BASOS PCT: 0.4 % (ref 0.0–3.0)
Basophils Absolute: 0 10*3/uL (ref 0.0–0.1)
Eosinophils Absolute: 0.5 10*3/uL (ref 0.0–0.7)
Eosinophils Relative: 9.4 % — ABNORMAL HIGH (ref 0.0–5.0)
HEMATOCRIT: 39.4 % (ref 36.0–46.0)
HEMOGLOBIN: 13.4 g/dL (ref 12.0–15.0)
LYMPHS ABS: 1.8 10*3/uL (ref 0.7–4.0)
LYMPHS PCT: 30.6 % (ref 12.0–46.0)
MCHC: 33.8 g/dL (ref 30.0–36.0)
MCV: 88.5 fl (ref 78.0–100.0)
Monocytes Absolute: 0.4 10*3/uL (ref 0.1–1.0)
Monocytes Relative: 7.5 % (ref 3.0–12.0)
NEUTROS ABS: 3 10*3/uL (ref 1.4–7.7)
Neutrophils Relative %: 52.1 % (ref 43.0–77.0)
Platelets: 275 10*3/uL (ref 150.0–400.0)
RBC: 4.46 Mil/uL (ref 3.87–5.11)
RDW: 13.7 % (ref 11.5–15.5)
WBC: 5.8 10*3/uL (ref 4.0–10.5)

## 2015-01-15 ENCOUNTER — Telehealth: Payer: Self-pay | Admitting: Internal Medicine

## 2015-01-15 NOTE — Telephone Encounter (Signed)
Please call/notify patient that lab/test/procedure is normal-no anemia

## 2015-01-15 NOTE — Telephone Encounter (Signed)
Patient would like to know her lab results

## 2015-01-16 NOTE — Telephone Encounter (Signed)
Spoke to pt, told her lab work was normal, no anemia per Dr. Raliegh Ip. Pt verbalized understanding.

## 2015-01-23 ENCOUNTER — Encounter: Payer: Self-pay | Admitting: Obstetrics

## 2015-01-23 ENCOUNTER — Ambulatory Visit: Payer: Managed Care, Other (non HMO) | Admitting: Obstetrics

## 2015-01-23 ENCOUNTER — Ambulatory Visit (INDEPENDENT_AMBULATORY_CARE_PROVIDER_SITE_OTHER): Payer: Managed Care, Other (non HMO)

## 2015-01-23 ENCOUNTER — Ambulatory Visit (INDEPENDENT_AMBULATORY_CARE_PROVIDER_SITE_OTHER): Payer: Managed Care, Other (non HMO) | Admitting: Obstetrics

## 2015-01-23 VITALS — BP 121/76 | HR 82 | Temp 97.7°F | Ht 62.0 in | Wt 170.0 lb

## 2015-01-23 DIAGNOSIS — Z01419 Encounter for gynecological examination (general) (routine) without abnormal findings: Secondary | ICD-10-CM | POA: Diagnosis not present

## 2015-01-23 DIAGNOSIS — Z124 Encounter for screening for malignant neoplasm of cervix: Secondary | ICD-10-CM

## 2015-01-23 DIAGNOSIS — N939 Abnormal uterine and vaginal bleeding, unspecified: Secondary | ICD-10-CM | POA: Diagnosis not present

## 2015-01-24 ENCOUNTER — Encounter: Payer: Self-pay | Admitting: Obstetrics

## 2015-01-24 NOTE — Progress Notes (Signed)
Subjective:        Chelsea Dyer is a 49 y.o. female here for a routine exam.  Current complaints: Heavy periods and sometimes bleeding in between periods..    Personal health questionnaire:  Is patient Ashkenazi Jewish, have a family history of breast and/or ovarian cancer: no Is there a family history of uterine cancer diagnosed at age < 30, gastrointestinal cancer, urinary tract cancer, family member who is a Field seismologist syndrome-associated carrier: no Is the patient overweight and hypertensive, family history of diabetes, personal history of gestational diabetes, preeclampsia or PCOS: no Is patient over 88, have PCOS,  family history of premature CHD under age 40, diabetes, smoke, have hypertension or peripheral artery disease:  no At any time, has a partner hit, kicked or otherwise hurt or frightened you?: no Over the past 2 weeks, have you felt down, depressed or hopeless?: no Over the past 2 weeks, have you felt little interest or pleasure in doing things?:no   Gynecologic History Patient's last menstrual period was 01/10/2015. Contraception: none Last Pap: 3 years ago. Results were: normal Last mammogram: 2016. Results were: normal  Obstetric History OB History  No data available    Past Medical History  Diagnosis Date  . 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 EXOGENOUS OBESITY 06/22/2010  . DEPRESSION 03/04/2008  . ALLERGIC RHINITIS 03/04/2008  . Prolactinoma   . Edema     pedal  . Anxiety     Past Surgical History  Procedure Laterality Date  . Breast surgery  2004    augmentation  . Liposuction    . Nasal sinus surgery       Current outpatient prescriptions:  .  fluticasone (FLONASE) 50 MCG/ACT nasal spray, Place 2 sprays into both nostrils daily., Disp: 16 g, Rfl: 6 .  venlafaxine XR (EFFEXOR-XR) 150 MG 24 hr capsule, Take 1 capsule (150 mg total) by mouth daily., Disp: 30 capsule, Rfl: 3 .  venlafaxine XR (EFFEXOR-XR) 75 MG 24 hr capsule, Take 1 capsule (75 mg  total) by mouth daily., Disp: 30 capsule, Rfl: 2 .  ibuprofen (ADVIL) 200 MG tablet, Take 800 mg by mouth every 8 (eight) hours as needed., Disp: , Rfl:  Allergies  Allergen Reactions  . Sulfamethoxazole-Trimethoprim     History  Substance Use Topics  . Smoking status: Never Smoker   . Smokeless tobacco: Never Used  . Alcohol Use: No    Family History  Problem Relation Age of Onset  . Arthritis Mother     osteo      Review of Systems  Constitutional: negative for fatigue and weight loss Respiratory: negative for cough and wheezing Cardiovascular: negative for chest pain, fatigue and palpitations Gastrointestinal: negative for abdominal pain and change in bowel habits Musculoskeletal:negative for myalgias Neurological: negative for gait problems and tremors Behavioral/Psych: negative for abusive relationship, depression Endocrine: negative for temperature intolerance   Genitourinary:positive for abnormal menstrual periods, genital lesions, hot flashes, sexual problems and vaginal discharge Integument/breast: negative for breast lump, breast tenderness, nipple discharge and skin lesion(s)    Objective:       BP 121/76 mmHg  Pulse 82  Temp(Src) 97.7 F (36.5 C)  Ht 5\' 2"  (1.575 m)  Wt 170 lb (77.111 kg)  BMI 31.09 kg/m2  LMP 01/10/2015 General:   alert  Skin:   no rash or abnormalities  Lungs:   clear to auscultation bilaterally  Heart:   regular rate and rhythm, S1, S2 normal, no murmur, click, rub or gallop  Breasts:  normal without suspicious masses, skin or nipple changes or axillary nodes  Abdomen:  normal findings: no organomegaly, soft, non-tender and no hernia  Pelvis:  External genitalia: normal general appearance Urinary system: urethral meatus normal and bladder without fullness, nontender Vaginal: normal without tenderness, induration or masses Cervix: normal appearance Adnexa: normal bimanual exam Uterus: anteverted and non-tender, normal size   Lab  Review Urine pregnancy test Labs reviewed yes Radiologic studies reviewed no    Assessment:    Healthy female exam.    AUB   Plan:   Ultrasound ordered F/U in 2 weeks for endometrial biopsy   Education reviewed: calcium supplements, low fat, low cholesterol diet, self breast exams, weight bearing exercise and management of AUB. Follow up in: 2 weeks.   No orders of the defined types were placed in this encounter.   Orders Placed This Encounter  Procedures  . SureSwab, Vaginosis/Vaginitis Plus  . US Transvaginal Non-OB    Standing Status: Future     Number of Occurrences: 1     Standing Expiration Date: 03/24/2016    Order Specific Question:  Reason for Exam (SYMPTOM  OR DIAGNOSIS REQUIRED)    Answer:  aub    Order Specific Question:  Preferred imaging location?    Answer:  Internal  . [place] Sonohysterogram    Standing Status: Future     Number of Occurrences:      Standing Expiration Date: 03/24/2016    Order Specific Question:  Reason for exam:    Answer:  AUB EVAL POLYP    Order Specific Question:  Preferred imaging location?    Answer:  Internal

## 2015-01-27 LAB — PAP IG AND HPV HIGH-RISK: HPV DNA High Risk: NOT DETECTED

## 2015-01-27 LAB — SURESWAB, VAGINOSIS/VAGINITIS PLUS
Atopobium vaginae: NOT DETECTED Log (cells/mL)
C. albicans, DNA: NOT DETECTED
C. glabrata, DNA: NOT DETECTED
C. parapsilosis, DNA: NOT DETECTED
C. trachomatis RNA, TMA: NOT DETECTED
C. tropicalis, DNA: NOT DETECTED
LACTOBACILLUS SPECIES: >8 Log (cells/mL)
MEGASPHAERA SPECIES: NOT DETECTED Log (cells/mL)
N. gonorrhoeae RNA, TMA: NOT DETECTED
T. vaginalis RNA, QL TMA: NOT DETECTED

## 2015-02-01 ENCOUNTER — Encounter (HOSPITAL_COMMUNITY): Payer: Self-pay

## 2015-02-01 ENCOUNTER — Emergency Department (HOSPITAL_COMMUNITY)
Admission: EM | Admit: 2015-02-01 | Discharge: 2015-02-01 | Disposition: A | Discharge status: Home / Self Care | Payer: Managed Care, Other (non HMO) | Attending: Emergency Medicine | Admitting: Emergency Medicine

## 2015-02-01 ENCOUNTER — Emergency Department (HOSPITAL_COMMUNITY): Payer: Managed Care, Other (non HMO)

## 2015-02-01 DIAGNOSIS — R0602 Shortness of breath: Secondary | ICD-10-CM | POA: Insufficient documentation

## 2015-02-01 DIAGNOSIS — F329 Major depressive disorder, single episode, unspecified: Secondary | ICD-10-CM | POA: Insufficient documentation

## 2015-02-01 DIAGNOSIS — Z86018 Personal history of other benign neoplasm: Secondary | ICD-10-CM | POA: Insufficient documentation

## 2015-02-01 DIAGNOSIS — R079 Chest pain, unspecified: Secondary | ICD-10-CM | POA: Diagnosis present

## 2015-02-01 DIAGNOSIS — F419 Anxiety disorder, unspecified: Secondary | ICD-10-CM

## 2015-02-01 DIAGNOSIS — Z79899 Other long term (current) drug therapy: Secondary | ICD-10-CM | POA: Diagnosis not present

## 2015-02-01 DIAGNOSIS — E669 Obesity, unspecified: Secondary | ICD-10-CM | POA: Diagnosis not present

## 2015-02-01 DIAGNOSIS — Z7951 Long term (current) use of inhaled steroids: Secondary | ICD-10-CM | POA: Diagnosis not present

## 2015-02-01 LAB — CBC
HEMATOCRIT: 43 % (ref 36.0–46.0)
Hemoglobin: 14.3 g/dL (ref 12.0–15.0)
MCH: 29.1 pg (ref 26.0–34.0)
MCHC: 33.3 g/dL (ref 30.0–36.0)
MCV: 87.6 fL (ref 78.0–100.0)
Platelets: 297 10*3/uL (ref 150–400)
RBC: 4.91 MIL/uL (ref 3.87–5.11)
RDW: 13.4 % (ref 11.5–15.5)
WBC: 6 10*3/uL (ref 4.0–10.5)

## 2015-02-01 LAB — BASIC METABOLIC PANEL
Anion gap: 7 (ref 5–15)
BUN: 13 mg/dL (ref 6–20)
CALCIUM: 9 mg/dL (ref 8.9–10.3)
CHLORIDE: 107 mmol/L (ref 101–111)
CO2: 23 mmol/L (ref 22–32)
Creatinine, Ser: 0.61 mg/dL (ref 0.44–1.00)
GFR calc non Af Amer: >60 mL/min (ref >60)
Glucose, Bld: 74 mg/dL (ref 65–99)
POTASSIUM: 3.8 mmol/L (ref 3.5–5.1)
Sodium: 137 mmol/L (ref 135–145)

## 2015-02-01 LAB — I-STAT TROPONIN, ED: Troponin i, poc: 0 ng/mL (ref 0.00–0.08)

## 2015-02-01 MED ORDER — LORAZEPAM 1 MG PO TABS: 1.0000 mg | ORAL_TABLET | Freq: Two times a day (BID) | ORAL | Status: DC | PRN

## 2015-02-01 MED ORDER — LORAZEPAM 1 MG PO TABS
1.0000 mg | ORAL_TABLET | Freq: Once | ORAL | Status: AC
  Administered 2015-02-01: 1 mg via ORAL
  Filled 2015-02-01: qty 1

## 2015-02-01 NOTE — ED Provider Notes (Signed)
CSN: [id number]     Arrival date & time 02/01/15  1356 History   First MD Initiated Contact with Patient 02/01/15 1458     Chief Complaint  Patient presents with  . Chest Pain     (Consider location/radiation/quality/duration/timing/severity/associated sxs/prior Treatment) HPI 49 year old female presents after having chest tightness that started last night during dinner. The patient was eating pizza and felt a sudden onset of chest tightness. This is been continuous since last night. She went to sleep and there was no symptoms but as soon she woke up she had it and has been constant up until now. The patient states that she feels like she's having a hard time catching her breath. Denies any diaphoresis, nausea, or vomiting. No leg swelling or leg pain. No history of blood clots. No history of hypertension, hyperlipidemia, diabetes, smoking, or family history of early coronary disease. The patient states the tightness is mild at this time. She has a history of depression and anxiety as listed in her medical history. She does not taken anything specifically for anxiety. The patient states that she has a lot of things going on in her life that caused her to be anxious and stressed but does not want to talk about them right now. Patient is also concerned because she had a normally formed bowel movement today but it was green. No blood or diarrhea. Does not think she ate anything that could have changed her stool color.  Past Medical History  Diagnosis Date  . 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 EXOGENOUS OBESITY 06/22/2010  . DEPRESSION 03/04/2008  . ALLERGIC RHINITIS 03/04/2008  . Prolactinoma   . Edema     pedal  . Anxiety    Past Surgical History  Procedure Laterality Date  . Breast surgery  2004    augmentation  . Liposuction    . Nasal sinus surgery     Family History  Problem Relation Age of Onset  . Arthritis Mother     osteo   History  Substance Use Topics  . Smoking status:  Never Smoker   . Smokeless tobacco: Never Used  . Alcohol Use: No   OB History    No data available     Review of Systems  Constitutional: Negative for fever.  Respiratory: Positive for shortness of breath. Negative for cough.   Cardiovascular: Positive for chest pain. Negative for leg swelling.  Gastrointestinal: Negative for nausea, vomiting, abdominal pain, diarrhea and blood in stool.  All other systems reviewed and are negative.     Allergies  Sulfamethoxazole-trimethoprim  Home Medications   Prior to Admission medications   Medication Sig Start Date End Date Taking? Authorizing Provider  cetirizine (ZYRTEC) 10 MG tablet Take 10 mg by mouth daily.   Yes Historical Provider, MD  fluticasone (FLONASE) 50 MCG/ACT nasal spray Place 2 sprays into both nostrils daily. 01/13/15  Yes Marletta Lor, MD  ibuprofen (ADVIL) 200 MG tablet Take 800 mg by mouth every 8 (eight) hours as needed.   Yes Historical Provider, MD  venlafaxine XR (EFFEXOR-XR) 150 MG 24 hr capsule Take 1 capsule (150 mg total) by mouth daily. 01/13/15  Yes Marletta Lor, MD  venlafaxine XR (EFFEXOR-XR) 75 MG 24 hr capsule Take 1 capsule (75 mg total) by mouth daily. 01/13/15  Yes Marletta Lor, MD   BP 117/72 mmHg  Pulse 63  Temp(Src) 98.2 F (36.8 C) (Oral)  Resp 17  Ht 5\' 2"  (1.575 m)  Wt 170 lb (77.111  kg)  BMI 31.09 kg/m2  SpO2 99%  LMP 01/10/2015 Physical Exam  Constitutional: She is oriented to person, place, and time. She appears well-developed and well-nourished.  HENT:  Head: Normocephalic and atraumatic.  Right Ear: External ear normal.  Left Ear: External ear normal.  Nose: Nose normal.  Eyes: Right eye exhibits no discharge. Left eye exhibits no discharge.  Neck: Neck supple.  Cardiovascular: Normal rate, regular rhythm and normal heart sounds.   Pulmonary/Chest: Effort normal and breath sounds normal. She has no wheezes. She has no rales. She exhibits no tenderness.   Abdominal: Soft. She exhibits no distension. There is no tenderness.  Musculoskeletal: She exhibits no edema or tenderness.  Neurological: She is alert and oriented to person, place, and time.  Skin: Skin is warm and dry.  Nursing note and vitals reviewed.   ED Course  Procedures (including critical care time) Labs Review Labs Reviewed  [place], ED    Imaging Review Dg Chest 2 View  02/01/2015   CLINICAL DATA:  Two week history of mid chest pain when walking. Shortness of breath. Sensation of food sticking in the mid esophagus yesterday. Nonsmoker.  EXAM: CHEST  2 VIEW  COMPARISON:  None.  FINDINGS: Cardiomediastinal silhouette unremarkable. Lungs clear. Bronchovascular markings normal. Pulmonary vascularity normal. No visible pleural effusions. No pneumothorax. Mild degenerative changes involving the thoracic spine.  IMPRESSION: No acute cardiopulmonary disease.   Electronically Signed   By: Evangeline Dakin M.D.   On: 02/01/2015 15:02     EKG Interpretation   Date/Time:  Saturday Feb 01 2015 14:01:30 EDT Ventricular Rate:  69 PR Interval:  158 QRS Duration: 82 QT Interval:  378 QTC Calculation: 405 R Axis:   79 Text Interpretation:  Normal sinus rhythm Normal ECG No old tracing to  compare Confirmed by Demetre Monaco  MD, Tayelor Osborne ([id number]) on 02/01/2015 2:59:38 PM      MDM   Final diagnoses:  Nonspecific chest pain  Anxiety    Patient's chest pain seems mostly anxiety driven. She feels much better after 1 mg Ativan by mouth. Given the tightness, normal EKG, and no exertional symptoms I have very low suspicion for ACS causing her symptoms. No pleuritic symptoms or signs of DVT and thus I feel like she is very low risk for pulmonary embolism and do not feel further workup is indicated. The patient is well-appearing here and has a HEART score of 1. Discussed need for close f/u with PCP and discussed strict return precautions. Given the patient has  been having significant anxiety she was given a small amount of Ativan as needed for anxiety upon discharge.    Sherwood Gambler, MD 02/01/15 [phone]

## 2015-02-01 NOTE — Discharge Instructions (Signed)
Chest Pain (Nonspecific) °It is often hard to give a specific diagnosis for the cause of chest pain. There is always a chance that your pain could be related to something serious, such as a heart attack or a blood clot in the lungs. You need to follow up with your health care provider for further evaluation. °CAUSES  °· Heartburn. °· Pneumonia or bronchitis. °· Anxiety or stress. °· Inflammation around your heart (pericarditis) or lung (pleuritis or pleurisy). °· A blood clot in the lung. °· A collapsed lung (pneumothorax). It can develop suddenly on its own (spontaneous pneumothorax) or from trauma to the chest. °· Shingles infection (herpes zoster virus). °The chest wall is composed of bones, muscles, and cartilage. Any of these can be the source of the pain. °· The bones can be bruised by injury. °· The muscles or cartilage can be strained by coughing or overwork. °· The cartilage can be affected by inflammation and become sore (costochondritis). °DIAGNOSIS  °Lab tests or other studies may be needed to find the cause of your pain. Your health care provider may have you take a test called an ambulatory electrocardiogram (ECG). An ECG records your heartbeat patterns over a 24-hour period. You may also have other tests, such as: °· Transthoracic echocardiogram (TTE). During echocardiography, sound waves are used to evaluate how blood flows through your heart. °· Transesophageal echocardiogram (TEE). °· Cardiac monitoring. This allows your health care provider to monitor your heart rate and rhythm in real time. °· Holter monitor. This is a portable device that records your heartbeat and can help diagnose heart arrhythmias. It allows your health care provider to track your heart activity for several days, if needed. °· Stress tests by exercise or by giving medicine that makes the heart beat faster. °TREATMENT  °· Treatment depends on what may be causing your chest pain. Treatment may include: °¨ Acid blockers for  heartburn. °¨ Anti-inflammatory medicine. °¨ Pain medicine for inflammatory conditions. °¨ Antibiotics if an infection is present. °· You may be advised to change lifestyle habits. This includes stopping smoking and avoiding alcohol, caffeine, and chocolate. °· You may be advised to keep your head raised (elevated) when sleeping. This reduces the chance of acid going backward from your stomach into your esophagus. °Most of the time, nonspecific chest pain will improve within 2-3 days with rest and mild pain medicine.  °HOME CARE INSTRUCTIONS  °· If antibiotics were prescribed, take them as directed. Finish them even if you start to feel better. °· For the next few days, avoid physical activities that bring on chest pain. Continue physical activities as directed. °· Do not use any tobacco products, including cigarettes, chewing tobacco, or electronic cigarettes. °· Avoid drinking alcohol. °· Only take medicine as directed by your health care provider. °· Follow your health care provider's suggestions for further testing if your chest pain does not go away. °· Keep any follow-up appointments you made. If you do not go to an appointment, you could develop lasting (chronic) problems with pain. If there is any problem keeping an appointment, call to reschedule. °SEEK MEDICAL CARE IF:  °· Your chest pain does not go away, even after treatment. °· You have a rash with blisters on your chest. °· You have a fever. °SEEK IMMEDIATE MEDICAL CARE IF:  °· You have increased chest pain or pain that spreads to your arm, neck, jaw, back, or abdomen. °· You have shortness of breath. °· You have an increasing cough, or you cough   up blood.  You have severe back or abdominal pain.  You feel nauseous or vomit.  You have severe weakness.  You faint.  You have chills. This is an emergency. Do not wait to see if the pain will go away. Get medical help at once. Call your local emergency services (911 in [place] Do not drive  yourself to the hospital. MAKE SURE YOU:   Understand these instructions.  Will watch your condition.  Will get help right away if you are not doing well or get worse. Document Released: 06/09/2005 Document Revised: 09/04/2013 Document Reviewed: 04/04/2008 [facility] Patient Information 2015 [place], [place]. This information is not intended to replace advice given to you by your health care provider. Make sure you discuss any questions you have with your health care provider.   Generalized Anxiety Disorder Generalized anxiety disorder (GAD) is a mental disorder. It interferes with life functions, including relationships, work, and school. GAD is different from normal anxiety, which everyone experiences at some point in their lives in response to specific life events and activities. Normal anxiety actually helps [place] prepare for and get through these life events and activities. Normal anxiety goes away after the event or activity is over.  GAD causes anxiety that is not necessarily related to specific events or activities. It also causes excess anxiety in proportion to specific events or activities. The anxiety associated with GAD is also difficult to control. GAD can vary from mild to severe. People with severe GAD can have intense waves of anxiety with physical symptoms (panic attacks).  SYMPTOMS The anxiety and worry associated with GAD are difficult to control. This anxiety and worry are related to many life events and activities and also occur more days than not for 6 months or longer. People with GAD also have three or more of the following symptoms (one or more in children):  Restlessness.   Fatigue.  Difficulty concentrating.   Irritability.  Muscle tension.  Difficulty sleeping or unsatisfying sleep. DIAGNOSIS GAD is diagnosed through an assessment by your health care provider. Your health care provider will ask you questions aboutyour mood,physical symptoms, and events in your  life. Your health care provider may ask you about your medical history and use of alcohol or drugs, including prescription medicines. Your health care provider may also do a physical exam and blood tests. Certain medical conditions and the use of certain substances can cause symptoms similar to those associated with GAD. Your health care provider may refer you to a mental health specialist for further evaluation. TREATMENT The following therapies are usually used to treat GAD:   Medication. Antidepressant medication usually is prescribed for long-term daily control. Antianxiety medicines may be added in severe cases, especially when panic attacks occur.   Talk therapy (psychotherapy). Certain types of talk therapy can be helpful in treating GAD by providing support, education, and guidance. A form of talk therapy called cognitive behavioral therapy can teach you healthy ways to think about and react to daily life events and activities.  Stress managementtechniques. These include yoga, meditation, and exercise and can be very helpful when they are practiced regularly. A mental health specialist can help determine which treatment is best for you. Some people see improvement with one therapy. However, other people require a combination of therapies. Document Released: 12/25/2012 Document Revised: 01/14/2014 Document Reviewed: 12/25/2012 [facility] Patient Information 2015 [place], [place]. This information is not intended to replace advice given to you by your health care provider. Make sure you discuss any  questions you have with your health care provider.

## 2015-02-01 NOTE — ED Notes (Signed)
Pt started having tightness last night in her chest and thought she swallowed something the wrong way. Also reports some back pain. Went to the bathroom this morning and her poop was green. Didn't change anything ate organic chicken last night. Two weeks ago had an anxiety episode and trouble catching her breath.

## 2015-02-03 ENCOUNTER — Telehealth: Payer: Self-pay | Admitting: *Deleted

## 2015-02-03 NOTE — Telephone Encounter (Signed)
Per Dr. Jodi Mourning patient to have an endometrial biopsy 2 weeks from her last appointment. Scheduled patient for 02-06-15 @ 1:15 pm.

## 2015-02-06 ENCOUNTER — Ambulatory Visit (INDEPENDENT_AMBULATORY_CARE_PROVIDER_SITE_OTHER): Payer: Managed Care, Other (non HMO) | Admitting: Obstetrics

## 2015-02-06 ENCOUNTER — Other Ambulatory Visit: Payer: Self-pay | Admitting: Obstetrics

## 2015-02-06 VITALS — BP 119/77 | HR 73 | Temp 98.3°F | Wt 170.0 lb

## 2015-02-06 DIAGNOSIS — Z01812 Encounter for preprocedural laboratory examination: Secondary | ICD-10-CM

## 2015-02-06 DIAGNOSIS — N939 Abnormal uterine and vaginal bleeding, unspecified: Secondary | ICD-10-CM

## 2015-02-06 LAB — POCT URINE PREGNANCY: Preg Test, Ur: NEGATIVE

## 2015-02-07 ENCOUNTER — Encounter: Payer: Self-pay | Admitting: Obstetrics

## 2015-02-07 NOTE — Progress Notes (Signed)
Endometrial Biopsy Procedure Note  Pre-operative Diagnosis: AUB  Post-operative Diagnosis: same  Indications: abnormal uterine bleeding  Procedure Details   Urine pregnancy test was done in office and result was negative.  The risks (including infection, bleeding, pain, and uterine perforation) and benefits of the procedure were explained to the patient and Written informed consent was obtained.    The patient was placed in the dorsal lithotomy position.  Bimanual exam showed the uterus to be in the neutral position.  A Graves' speculum inserted in the vagina, and the cervix prepped with povidone iodine.  Endocervical curettage with a Kevorkian curette was not performed.   A sharp tenaculum was applied to the anterior lip of the cervix for stabilization.  A sterile uterine sound was used to sound the uterus to a depth of 7cm.  A Pipelle endometrial aspirator was used to sample the endometrium.  Sample was sent for pathologic examination.  Condition: Stable  Complications: None  Plan:  The patient was advised to call for any fever or for prolonged or severe pain or bleeding. She was advised to use NSAID as needed for mild to moderate pain. She was advised to avoid vaginal intercourse for 48 hours or until the bleeding has completely stopped.  Attending Physician Documentation: I was present for or participated in the entire procedure, including opening and closing.

## 2015-02-25 ENCOUNTER — Ambulatory Visit (INDEPENDENT_AMBULATORY_CARE_PROVIDER_SITE_OTHER): Payer: Managed Care, Other (non HMO) | Admitting: Podiatry

## 2015-02-25 ENCOUNTER — Ambulatory Visit (INDEPENDENT_AMBULATORY_CARE_PROVIDER_SITE_OTHER): Payer: Managed Care, Other (non HMO)

## 2015-02-25 ENCOUNTER — Encounter: Payer: Self-pay | Admitting: Obstetrics

## 2015-02-25 ENCOUNTER — Encounter: Payer: Self-pay | Admitting: Podiatry

## 2015-02-25 ENCOUNTER — Ambulatory Visit: Payer: Managed Care, Other (non HMO)

## 2015-02-25 VITALS — BP 124/71 | HR 69 | Resp 16

## 2015-02-25 DIAGNOSIS — M2040 Other hammer toe(s) (acquired), unspecified foot: Secondary | ICD-10-CM

## 2015-02-25 DIAGNOSIS — M2010 Hallux valgus (acquired), unspecified foot: Secondary | ICD-10-CM | POA: Diagnosis not present

## 2015-02-25 NOTE — Patient Instructions (Signed)
Pre-Operative Instructions  Congratulations, you have decided to take an important step to improving your quality of life.  You can be assured that the doctors of [facility] will be with you every step of the way.  1. Plan to be at the surgery center/hospital at least 1 (one) hour prior to your scheduled time unless otherwise directed by the surgical center/hospital staff.  You must have a responsible adult accompany you, remain during the surgery and drive you home.  Make sure you have directions to the surgical center/hospital and know how to get there on time. 2. For hospital based surgery you will need to obtain a history and physical form from your family physician within 1 month prior to the date of surgery- we will give you a form for you primary physician.  3. We make every effort to accommodate the date you request for surgery.  There are however, times where surgery dates or times have to be moved.  We will contact you as soon as possible if a change in schedule is required.   4. No Aspirin/Ibuprofen for one week before surgery.  If you are on aspirin, any non-steroidal anti-inflammatory medications (Mobic, Aleve, Ibuprofen) you should stop taking it 7 days prior to your surgery.  You make take Tylenol  For pain prior to surgery.  5. Medications- If you are taking daily heart and blood pressure medications, seizure, reflux, allergy, asthma, anxiety, pain or diabetes medications, make sure the surgery center/hospital is aware before the day of surgery so they may notify you which medications to take or avoid the day of surgery. 6. No food or drink after midnight the night before surgery unless directed otherwise by surgical center/hospital staff. 7. No alcoholic beverages 24 hours prior to surgery.  No smoking 24 hours prior to or 24 hours after surgery. 8. Wear loose pants or shorts- loose enough to fit over bandages, boots, and casts. 9. No slip on shoes, sneakers are best. 10. Bring  your boot with you to the surgery center/hospital.  Also bring crutches or a walker if your physician has prescribed it for you.  If you do not have this equipment, it will be provided for you after surgery. 11. If you have not been contracted by the surgery center/hospital by the day before your surgery, call to confirm the date and time of your surgery. 12. Leave-time from work may vary depending on the type of surgery you have.  Appropriate arrangements should be made prior to surgery with your employer. 13. Prescriptions will be provided immediately following surgery by your doctor.  Have these filled as soon as possible after surgery and take the medication as directed. 14. Remove nail polish on the operative foot. 15. Wash the night before surgery.  The night before surgery wash the foot and leg well with the antibacterial soap provided and water paying special attention to beneath the toenails and in between the toes.  Rinse thoroughly with water and dry well with a towel.  Perform this wash unless told not to do so by your physician.  Enclosed: 1 Ice pack (please put in freezer the night before surgery)   1 Hibiclens skin cleaner   Pre-op Instructions  If you have any questions regarding the instructions, do not hesitate to call our office.  [place]: [address] [place], [place] [address] [phone]  [place]: [address] [place], [place] [address] [phone]  [place]: [address]  [place], [name] [address] [phone]  Dr. Richard   Tuchman DPM, Dr. Norman Regal DPM Dr. Richard Sikora DPM, Dr. M. Todd Hyatt DPM, Dr. Kathryn Egerton DPM 

## 2015-02-25 NOTE — Progress Notes (Signed)
   Subjective:    Patient ID: Chelsea Dyer, female    DOB: August 05, 1966, 49 y.o.   MRN: [id number]  HPI Comments: "I have a hammertoe"  Patient c/o aching 2nd toe right for about 3-6 months. The knuckle is red and swollen. Now getting sharp pains through forefoot and midfoot. She did see a podiatrist and was given several cortisone injections over the course of a few months. Can't wear enclosed shoes comfortably. Also tried a corn pad to cushion-some help.   Foot Pain Associated symptoms include arthralgias.      Review of Systems  Musculoskeletal: Positive for arthralgias.  All other systems reviewed and are negative.      Objective:   Physical Exam: I have reviewed her past medical history medications allergy surgery social history and review of systems. Pulses are strongly palpable bilateral. Neurologic sensorium is intact per Semmes-Weinstein monofilament. Deep tendon reflexes are intact bilateral and muscle strength is 5 over 5 dorsiflexion plantar flexors and inverters everters all intrinsic musculature was intact. Orthopedic evaluation demonstrates all joints distal to the ankle have a full range of motion without crepitus with exception of the second digit at the metatarsophalangeal joint right foot. She has a total dislocation of the joint dorsally and plantar flexion of the PIPJ of the second digit right this is rigid in nature and with this complete disruption is a surgical candidate. Cutaneous evaluation demonstrates supple well-hydrated cutis no erythema edema cellulitis drainage or odor.        Assessment & Plan:  Assessment: Dislocation second digit hammertoe deformity with osteoarthritis right foot.  Plan: We discussed etiology pathology conservative versus surgical therapies. At this point we need to repair the second metatarsophalangeal joint and hammertoe. She was consented today for surgical intervention consisting of a second metatarsal osteotomy right foot as well as  a hammertoe repair second digit right foot with a pin or screw. She understands this and is amenable to it. She would like to have this done as soon as possible. We did discuss the possible postop complications which may include but are not limited to postop pain bleeding swelling infection recurrence need for further surgery development of a DVT or pulmonary embolism which could cause life-threatening complications. She understands this and is amenable to it she understands it signs all 3 pages of the consent form and we will follow-up with her in the near future for surgical intervention. He was dispensed a surgical boot for her postop.Marland Kitchen

## 2015-02-26 ENCOUNTER — Telehealth: Payer: Self-pay | Admitting: *Deleted

## 2015-02-26 DIAGNOSIS — M2040 Other hammer toe(s) (acquired), unspecified foot: Secondary | ICD-10-CM

## 2015-02-26 NOTE — Telephone Encounter (Signed)
I called patient per Dr. Milinda Pointer to see if she wanted to reschedule surgery from 03/14/2015 to this Friday, 02/28/2015.  He had a cancellation.  "No, I'd rather not I have to work out plans to get paid while I'm out on leave."  Okay, that is fine.  We will keep it scheduled for 03/14/2015.

## 2015-02-27 NOTE — Telephone Encounter (Signed)
Patient called 02/27/2015 2:14 to request her lab result and possibly discuss getting her surgery scheduled.

## 2015-03-11 ENCOUNTER — Ambulatory Visit: Payer: Managed Care, Other (non HMO) | Admitting: Podiatry

## 2015-03-13 ENCOUNTER — Other Ambulatory Visit: Payer: Self-pay | Admitting: Podiatry

## 2015-03-13 ENCOUNTER — Telehealth: Payer: Self-pay | Admitting: *Deleted

## 2015-03-13 MED ORDER — CEPHALEXIN 500 MG PO CAPS: 500.0000 mg | ORAL_CAPSULE | Freq: Three times a day (TID) | ORAL | Status: DC

## 2015-03-13 MED ORDER — PROMETHAZINE HCL 25 MG PO TABS: 25.0000 mg | ORAL_TABLET | Freq: Three times a day (TID) | ORAL | Status: DC | PRN

## 2015-03-13 MED ORDER — OXYCODONE-ACETAMINOPHEN 10-325 MG PO TABS: ORAL_TABLET | ORAL | Status: DC

## 2015-03-13 NOTE — Telephone Encounter (Signed)
"  I'm calling to get my prescription for pain medication.  I'm scheduled for surgery on tomorrow."  Dr. Milinda Pointer normally doesn't give prescriptions for narcotics in advance.  "He told me he would give it to me in advance.  I'm the only one that drives in my house."  I'll ask him and if he okays it, you'll have to come by to pick up the prescription because narcotics can't be called into the pharmacy.  "He said he'd give it to me last time I saw him.  Can't you just call it into [facility]?"  No, narcotics can't be called into pharmacy.  You will have to come by to pick up the prescription or he will give it to you tomorrow after surgery.  "Just forget it, I'll just get it tomorrow."

## 2015-03-14 ENCOUNTER — Encounter: Payer: Self-pay | Admitting: Podiatry

## 2015-03-14 DIAGNOSIS — M2041 Other hammer toe(s) (acquired), right foot: Secondary | ICD-10-CM | POA: Diagnosis not present

## 2015-03-14 DIAGNOSIS — M21541 Acquired clubfoot, right foot: Secondary | ICD-10-CM | POA: Diagnosis not present

## 2015-03-20 ENCOUNTER — Ambulatory Visit (INDEPENDENT_AMBULATORY_CARE_PROVIDER_SITE_OTHER): Payer: Managed Care, Other (non HMO)

## 2015-03-20 ENCOUNTER — Ambulatory Visit (INDEPENDENT_AMBULATORY_CARE_PROVIDER_SITE_OTHER): Payer: Managed Care, Other (non HMO) | Admitting: Podiatry

## 2015-03-20 VITALS — BP 111/69 | HR 89 | Temp 98.6°F | Resp 16

## 2015-03-20 DIAGNOSIS — M79673 Pain in unspecified foot: Secondary | ICD-10-CM

## 2015-03-20 DIAGNOSIS — M2041 Other hammer toe(s) (acquired), right foot: Secondary | ICD-10-CM | POA: Diagnosis not present

## 2015-03-20 DIAGNOSIS — Z9889 Other specified postprocedural states: Secondary | ICD-10-CM

## 2015-03-20 NOTE — Progress Notes (Signed)
She presents today 1 week status post second metatarsal osteotomy hammertoe repair with screw fixation. Date of surgery 03/14/2015. She states that is doing fine. She denies fever chills nausea vomiting muscle aches and pains. Denies shortness of breath or calf pain.  Objective: Dry sterile dressing was intact vital signs are stable she is alert and oriented 3. Was a dry sterile dressing was removed demonstrates mild erythema and no edema saline as drainage or odor sutures are intact and K wires intact. Radiographs demonstrate well-placed osteotomy with internal fixation and good approximation of the PIPJ with K wire.  Assessment: Well-healing surgical for status post 1 week. Redress today with a dresser compressive dressing encouraged her to keep this elevated and stay off of it as much as possible I will follow-up with her in 1 week force suture removal if possible. At that time she will be placed in a Darco shoe and a compression anklet

## 2015-03-22 ENCOUNTER — Other Ambulatory Visit: Payer: Self-pay | Admitting: Internal Medicine

## 2015-03-25 ENCOUNTER — Other Ambulatory Visit: Payer: Self-pay

## 2015-03-25 ENCOUNTER — Ambulatory Visit (INDEPENDENT_AMBULATORY_CARE_PROVIDER_SITE_OTHER): Payer: Managed Care, Other (non HMO)

## 2015-03-25 VITALS — BP 124/74 | HR 65 | Resp 15

## 2015-03-25 DIAGNOSIS — M2041 Other hammer toe(s) (acquired), right foot: Secondary | ICD-10-CM

## 2015-03-25 MED ORDER — HYDROCODONE-ACETAMINOPHEN 7.5-300 MG PO TABS: 1.0000 | ORAL_TABLET | ORAL | Status: DC | PRN

## 2015-03-25 MED ORDER — TRAMADOL HCL 50 MG PO TABS: 50.0000 mg | ORAL_TABLET | Freq: Two times a day (BID) | ORAL | Status: DC | PRN

## 2015-03-25 MED ORDER — HYDROCODONE-ACETAMINOPHEN 7.5-300 MG PO TABS
1.0000 | ORAL_TABLET | ORAL | Status: DC | PRN
Start: 2015-03-25 — End: 2015-03-31

## 2015-03-25 NOTE — Progress Notes (Addendum)
Pt was seen today for follow post operative appt. She is 2 weeks post op 2nd met osteotomy right foot. She presents today for suture removal. She stated that the pain medication prescribed (Percocet) to her made her feel anxious and dizzy. Discussed pain medication options, i told her that i would provide an alternative pain medication if needed, she is currently taking 645m of ibuprofen occasionally  throughout the day. . Patient did admit to "over doing it" yesterday and has since had an increase in pain. Advised her to rest and relax, elevate and ice her foot and to only be up on her foot 10 minutes every hour as needed.  Darco shoe was dispensed at this time and she was advised to use it intermittently with boot. Advised that she could get area wet but no soaking or baths. Redress foot as needed. Sutures were removed, wound edges remained aligned and approximated, steri-strips applied, foot re-dressed. No s/s of infection or erythema. Pin in 2nd right toe was in place, no redness or drainage at insertion site. Advised patient to call with any questions or concerns or change in status, reappointed to return in 2 weeks.

## 2015-03-31 ENCOUNTER — Telehealth: Payer: Self-pay | Admitting: *Deleted

## 2015-03-31 MED ORDER — HYDROCODONE-ACETAMINOPHEN 10-325 MG PO TABS: 1.0000 | ORAL_TABLET | ORAL | Status: DC | PRN

## 2015-03-31 NOTE — Telephone Encounter (Addendum)
Pt states the Vicodin 7.5/300 and Ibuprofen are not helping, and she would like more pain medication that will not interfere with her Effexor.  I informed pt, Dr. Jacqualyn Posey ordered a stronger Vicodin, she should rest, ice and elevate, pt is to pick up the rx in [place] office.

## 2015-03-31 NOTE — Telephone Encounter (Signed)
Can change to Vicodin 10/325 1 tab PO q4h prn pain. Disp #30. Ice/elevation.

## 2015-04-08 ENCOUNTER — Encounter: Payer: Self-pay | Admitting: Podiatry

## 2015-04-08 ENCOUNTER — Ambulatory Visit (INDEPENDENT_AMBULATORY_CARE_PROVIDER_SITE_OTHER): Payer: Managed Care, Other (non HMO)

## 2015-04-08 ENCOUNTER — Ambulatory Visit (INDEPENDENT_AMBULATORY_CARE_PROVIDER_SITE_OTHER): Payer: Managed Care, Other (non HMO) | Admitting: Podiatry

## 2015-04-08 VITALS — BP 121/80 | HR 76 | Temp 95.9°F | Resp 15

## 2015-04-08 DIAGNOSIS — M2041 Other hammer toe(s) (acquired), right foot: Secondary | ICD-10-CM | POA: Diagnosis not present

## 2015-04-08 DIAGNOSIS — Z9889 Other specified postprocedural states: Secondary | ICD-10-CM

## 2015-04-08 NOTE — Progress Notes (Signed)
She presents today one month status post second metatarsal osteotomy and hammertoe repair with K wire second digit right foot. She states that she is very nervous today, she think she is going to have the wire removed. She denies changes in her past medical history medications and allergies. She did state that she had a nightmare one evening and think she may have bent her K wire.  Objective: Vital signs are stable she is alert and oriented 3 K wire to the second digit is intact. Radiographs confirm well healed second metatarsal osteotomy with K wire across the DIPJ PIPJ and second metatarsophalangeal joint. It is definitely bent at the second metatarsophalangeal joint. The toe still appears to be rectus. She still has some healing to do at the [facility] for good arthrodesis.  Assessment: Well-healing surgical foot right.  Plan: I will follow-up with her in 2 weeks at which time x-rays will be taken. As long as our arthrodesis appears to be complete we will remove the pin and utilized local anesthesia as a block.

## 2015-04-09 NOTE — Progress Notes (Signed)
DOS 03/14/2015 2nd metatarsal osteotomy with screw right, hammer toe repair 2nd right with pin/screw.

## 2015-04-16 ENCOUNTER — Other Ambulatory Visit: Payer: Self-pay | Admitting: Internal Medicine

## 2015-04-17 ENCOUNTER — Telehealth: Payer: Self-pay | Admitting: *Deleted

## 2015-04-17 NOTE — Telephone Encounter (Signed)
Pt states she received a letter from work, stating she was to return to work 04/28/2015, her short-term disability states she is out til 05/15/2015.  Pt states she had an appt 04/22/2015 and she thinks the pin with be removed then.  Pt states she needs some type of paperwork faxed with the proper dates.

## 2015-04-22 ENCOUNTER — Ambulatory Visit (INDEPENDENT_AMBULATORY_CARE_PROVIDER_SITE_OTHER): Payer: Managed Care, Other (non HMO)

## 2015-04-22 ENCOUNTER — Ambulatory Visit (INDEPENDENT_AMBULATORY_CARE_PROVIDER_SITE_OTHER): Payer: Managed Care, Other (non HMO) | Admitting: Podiatry

## 2015-04-22 ENCOUNTER — Encounter: Payer: Self-pay | Admitting: Podiatry

## 2015-04-22 VITALS — BP 144/87 | HR 78 | Resp 16

## 2015-04-22 DIAGNOSIS — M2041 Other hammer toe(s) (acquired), right foot: Secondary | ICD-10-CM

## 2015-04-22 DIAGNOSIS — Z9889 Other specified postprocedural states: Secondary | ICD-10-CM

## 2015-04-23 NOTE — Progress Notes (Signed)
She presents today status post second metatarsal osteotomy hammertoe repair second toe with pin right foot. She denies fever chills nausea vomiting muscle aches and pains. States that she has been off of the foot. She states that did not want to bend the pin anymore than we already have.  Objective: Vital signs are stable she is alert and oriented 3. Pulses are intact. No erythema or edema cellulitis drainage or odor. K wires intact second digit right foot. Radiograph confirms good arthrodesis at the PIPJ stable enough to remove the pin.  Assessment: Status post second metatarsal osteotomy hammertoe repair right foot.  Plan: Discussed etiology pathology conservative versus surgical therapies. I placed local anesthesia about the second metatarsophalangeal joint of the right foot. I removed the K wire in total. Demonstrated to the patient how to wrap the toe daily with Coban and and I will follow-up with her in 2-3 weeks.

## 2015-05-02 ENCOUNTER — Other Ambulatory Visit: Payer: Self-pay | Admitting: Obstetrics & Gynecology

## 2015-05-02 DIAGNOSIS — E221 Hyperprolactinemia: Secondary | ICD-10-CM

## 2015-05-06 ENCOUNTER — Encounter: Payer: Self-pay | Admitting: Podiatry

## 2015-05-06 ENCOUNTER — Ambulatory Visit (INDEPENDENT_AMBULATORY_CARE_PROVIDER_SITE_OTHER): Payer: Managed Care, Other (non HMO) | Admitting: Podiatry

## 2015-05-06 ENCOUNTER — Ambulatory Visit (INDEPENDENT_AMBULATORY_CARE_PROVIDER_SITE_OTHER): Payer: Managed Care, Other (non HMO)

## 2015-05-06 VITALS — BP 105/63 | HR 70 | Temp 98.0°F | Resp 16

## 2015-05-06 DIAGNOSIS — Z9889 Other specified postprocedural states: Secondary | ICD-10-CM

## 2015-05-06 DIAGNOSIS — M2041 Other hammer toe(s) (acquired), right foot: Secondary | ICD-10-CM | POA: Diagnosis not present

## 2015-05-06 NOTE — Progress Notes (Signed)
She presents today for six-week follow-up second metatarsal osteotomy and hammertoe repair right foot. She states this still little tender in the forefoot seems to be doing much better. She denies fever chills nausea vomiting muscle aches and pains.  Objective: Vital signs are stable she is alert and oriented 3. No edema no erythema cellulitis drainage or odor incisions have gone on to heal very well. She has no pain on range of motion or palpation of the second metatarsophalangeal joint right foot. Radiograph confirms well-healed osteotomy. 3 views taken in the office today.  Assessment: Well-healing surgical foot right status post second metatarsal osteotomy with hammertoe repair right.  Plan: I'm going allow her to start back to work wearing regular shoe gear and follow up with me in 1 month.  Chelsea Dyer DPM

## 2015-05-09 ENCOUNTER — Other Ambulatory Visit: Payer: BC Managed Care – PPO

## 2015-05-14 ENCOUNTER — Ambulatory Visit
Admission: RE | Admit: 2015-05-14 | Discharge: 2015-05-14 | Disposition: A | Discharge status: Home / Self Care | Payer: Managed Care, Other (non HMO) | Source: Ambulatory Visit | Attending: Obstetrics & Gynecology | Admitting: Obstetrics & Gynecology

## 2015-05-14 DIAGNOSIS — E221 Hyperprolactinemia: Secondary | ICD-10-CM

## 2015-05-14 MED ORDER — GADOBENATE DIMEGLUMINE 529 MG/ML IV SOLN
8.0000 mL | Freq: Once | INTRAVENOUS | Status: AC | PRN
  Administered 2015-05-14: 8 mL via INTRAVENOUS

## 2015-05-26 ENCOUNTER — Ambulatory Visit (INDEPENDENT_AMBULATORY_CARE_PROVIDER_SITE_OTHER): Payer: Managed Care, Other (non HMO) | Admitting: Internal Medicine

## 2015-05-26 ENCOUNTER — Encounter: Payer: Self-pay | Admitting: Internal Medicine

## 2015-05-26 VITALS — BP 104/70 | HR 76 | Temp 98.3°F | Ht 62.0 in | Wt 175.0 lb

## 2015-05-26 DIAGNOSIS — J309 Allergic rhinitis, unspecified: Secondary | ICD-10-CM

## 2015-05-26 DIAGNOSIS — J029 Acute pharyngitis, unspecified: Secondary | ICD-10-CM | POA: Diagnosis not present

## 2015-05-26 MED ORDER — HYDROCODONE-ACETAMINOPHEN 5-325 MG PO TABS: 1.0000 | ORAL_TABLET | Freq: Four times a day (QID) | ORAL | Status: DC | PRN

## 2015-05-26 MED ORDER — PREDNISONE 20 MG PO TABS: 20.0000 mg | ORAL_TABLET | Freq: Two times a day (BID) | ORAL | Status: DC

## 2015-05-26 NOTE — Progress Notes (Signed)
Subjective:    Patient ID: Chelsea Dyer, female    DOB: 02-Sep-1966, 49 y.o.   MRN: [id number]  HPI  49 year old patient who has a history of allergic rhinitis.  She has been on maintenance and Zyrtec as well as fluticasone nasal spray.  For the past 2 days she's had worsening sore throat, itchy eyes, sinus pressure and right ear discomfort.  She has noted increasing fatigue.  There is been no fever.  She has had minimal cough. She has been intolerant of oxycodone but has done well with hydrocodone.  Past Medical History  Diagnosis Date  . 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 EXOGENOUS OBESITY 06/22/2010  . DEPRESSION 03/04/2008  . ALLERGIC RHINITIS 03/04/2008  . Prolactinoma   . Edema     pedal  . Anxiety     Social History   Social History  . Marital Status: Married    Spouse Name: N/A  . Number of Children: N/A  . Years of Education: N/A   Occupational History  . Not on file.   Social History Main Topics  . Smoking status: Never Smoker   . Smokeless tobacco: Never Used  . Alcohol Use: No  . Drug Use: No  . Sexual Activity: Not Currently   Other Topics Concern  . Not on file   Social History Narrative    Past Surgical History  Procedure Laterality Date  . Breast surgery  2004    augmentation  . Liposuction    . Nasal sinus surgery      Family History  Problem Relation Age of Onset  . Arthritis Mother     osteo    Allergies  Allergen Reactions  . Percocet [Oxycodone-Acetaminophen] Other (See Comments)    Interfered with patient's antidepressants; made pt feel dizzy  . Sulfamethoxazole-Trimethoprim     Current Outpatient Prescriptions on File Prior to Visit  Medication Sig Dispense Refill  . cetirizine (ZYRTEC) 10 MG tablet Take 10 mg by mouth daily.    . Fish Oil-Cholecalciferol (FISH OIL + D3) 1000-1000 MG-UNIT CAPS Take by mouth.    . Flaxseed, Linseed, (FLAX SEEDS PO) Take by mouth.    . fluticasone (FLONASE) 50 MCG/ACT nasal spray Place 2  sprays into both nostrils daily. 16 g 6  . ibuprofen (ADVIL) 200 MG tablet Take 800 mg by mouth every 8 (eight) hours as needed.    Marland Kitchen LORazepam (ATIVAN) 1 MG tablet Take 1 tablet (1 mg total) by mouth 2 (two) times daily as needed for anxiety. 10 tablet 0  . promethazine (PHENERGAN) 25 MG tablet Take 1 tablet (25 mg total) by mouth every 8 (eight) hours as needed for nausea or vomiting. 20 tablet 0  . venlafaxine XR (EFFEXOR-XR) 150 MG 24 hr capsule Take 1 capsule (150 mg total) by mouth daily. 30 capsule 3  . venlafaxine XR (EFFEXOR-XR) 150 MG 24 hr capsule TAKE 1 CAPSULE BY MOUTH DAILY 30 capsule 5  . venlafaxine XR (EFFEXOR-XR) 75 MG 24 hr capsule TAKE 1 CAPSULE BY MOUTH ONCE DAILY 30 capsule 5   No current facility-administered medications on file prior to visit.    BP 104/70 mmHg  Pulse 76  Temp(Src) 98.3 F (36.8 C) (Oral)  Ht 5\' 2"  (1.575 m)  Wt 175 lb (79.379 kg)  BMI 32.00 kg/m2  SpO2 98%  LMP 05/05/2015     Review of Systems  Constitutional: Positive for activity change, appetite change and fatigue. Negative for fever, chills and diaphoresis.  HENT: Positive for congestion,  sinus pressure and sore throat. Negative for dental problem, hearing loss, rhinorrhea and tinnitus.   Eyes: Negative for pain, discharge and visual disturbance.  Respiratory: Positive for cough. Negative for shortness of breath.   Cardiovascular: Negative for chest pain, palpitations and leg swelling.  Gastrointestinal: Negative for nausea, vomiting, abdominal pain, diarrhea, constipation, blood in stool and abdominal distention.  Genitourinary: Negative for dysuria, urgency, frequency, hematuria, flank pain, vaginal bleeding, vaginal discharge, difficulty urinating, vaginal pain and pelvic pain.  Musculoskeletal: Negative for joint swelling, arthralgias and gait problem.  Skin: Negative for rash.  Neurological: Negative for dizziness, syncope, speech difficulty, weakness, numbness and headaches.    Hematological: Negative for adenopathy.  Psychiatric/Behavioral: Negative for behavioral problems, dysphoric mood and agitation. The patient is not nervous/anxious.        Objective:   Physical Exam  Constitutional: She is oriented to person, place, and time. She appears well-developed and well-nourished.  HENT:  Head: Normocephalic.  Right Ear: External ear normal.  Left Ear: External ear normal.  Mild erythema of the oropharynx with tonsillar enlargement  Eyes: Conjunctivae and EOM are normal. Pupils are equal, round, and reactive to light.  Neck: Normal range of motion. Neck supple. No thyromegaly present.  Cardiovascular: Normal rate, regular rhythm, normal heart sounds and intact distal pulses.   Pulmonary/Chest: Effort normal and breath sounds normal. No respiratory distress. She has no wheezes.  Abdominal: Soft. Bowel sounds are normal. She exhibits no mass. There is no tenderness.  Musculoskeletal: Normal range of motion.  Lymphadenopathy:    She has no cervical adenopathy.  Neurological: She is alert and oriented to person, place, and time.  Skin: Skin is warm and dry. No rash noted.  Psychiatric: She has a normal mood and affect. Her behavior is normal.          Assessment & Plan:   Flare allergic rhinitis.  Will treat with the oral prednisone 20 mg twice a day for 6 days.  Will continue maintenance medications Pharyngitis.  Will treat symptomatically

## 2015-05-26 NOTE — Patient Instructions (Signed)
Use saline irrigation, warm  moist compresses and over-the-counter decongestants only as directed.  Call if there is no improvement in 5 to 7 days, or sooner if you develop increasing pain, fever, or any new symptoms.  TREATMENT  Allergic rhinitis does not have a cure, but it can be controlled by:  Medicines and allergy shots (immunotherapy).  Avoiding the allergen. Hay fever may often be treated with antihistamines in pill or nasal spray forms. Antihistamines block the effects of histamine. There are over-the-counter medicines that may help with nasal congestion and swelling around the eyes. Check with your health care provider before taking or giving this medicine.  If avoiding the allergen or the medicine prescribed do not work, there are many new medicines your health care provider can prescribe. Stronger medicine may be used if initial measures are ineffective. Desensitizing injections can be used if medicine and avoidance does not work. Desensitization is when a patient is given ongoing shots until the body becomes less sensitive to the allergen. Make sure you follow up with your health care provider if problems continue.  HOME CARE INSTRUCTIONS  It is not possible to completely avoid allergens, but you can reduce your symptoms by taking steps to limit your exposure to them. It helps to know exactly what you are allergic to so that you can avoid your specific triggers.  SEEK MEDICAL CARE IF:  You have a fever.  You develop a cough that does not stop easily (persistent).  You have shortness of breath.  You start wheezing.  Symptoms interfere with normal daily activities.

## 2015-05-26 NOTE — Progress Notes (Signed)
Pre visit review using our clinic review tool, if applicable. No additional management support is needed unless otherwise documented below in the visit note. 

## 2015-06-03 ENCOUNTER — Ambulatory Visit (INDEPENDENT_AMBULATORY_CARE_PROVIDER_SITE_OTHER): Payer: Managed Care, Other (non HMO)

## 2015-06-03 ENCOUNTER — Ambulatory Visit (INDEPENDENT_AMBULATORY_CARE_PROVIDER_SITE_OTHER): Payer: Managed Care, Other (non HMO) | Admitting: Podiatry

## 2015-06-03 ENCOUNTER — Encounter: Payer: Self-pay | Admitting: Podiatry

## 2015-06-03 VITALS — BP 113/72 | HR 78 | Resp 16

## 2015-06-03 DIAGNOSIS — Z9889 Other specified postprocedural states: Secondary | ICD-10-CM

## 2015-06-03 DIAGNOSIS — M2041 Other hammer toe(s) (acquired), right foot: Secondary | ICD-10-CM

## 2015-06-03 NOTE — Progress Notes (Signed)
She presents today for a postop visit date of surgery 03/14/2015. She states that it hurts sometimes if she refers to the first metatarsal phalangeal joint bunion repair and the second metatarsal osteotomy with screw fixation. She also had a hammertoe repair with pin fixation right.  Objective: Vital signs are stable she is alert and oriented 3. Pulses are palpable. Minimal edema no erythema cellulitis drainage or odor she has great range of motion of the surgical sites. Radiographs confirm well-healed osteotomies. Slight contracture at the second metatarsophalangeal joint.  Assessment: Well-healing surgical foot status post 2-1/2 months.  Plan: Dispensed a Darco splint to hold the second toe down and I will follow-up with her in the 6 weeks for another set of x-rays hopefully this will be the last visit.

## 2015-07-02 ENCOUNTER — Telehealth: Payer: Self-pay | Admitting: Internal Medicine

## 2015-07-02 MED ORDER — FLUCONAZOLE 150 MG PO TABS: 150.0000 mg | ORAL_TABLET | Freq: Once | ORAL | Status: DC

## 2015-07-02 NOTE — Telephone Encounter (Signed)
ok 

## 2015-07-02 NOTE — Telephone Encounter (Signed)
Okay to fill Diflucan one time?

## 2015-07-02 NOTE — Telephone Encounter (Signed)
Left message on personal voicemail Rx sent to pharmacy as requested. 

## 2015-07-02 NOTE — Telephone Encounter (Signed)
Pt call to ask if that one pill that you take for a yeast infection can be called in. Pt was taking antibiotic for a sinus infection.    Pharmacy; [facility] [address]

## 2015-07-04 ENCOUNTER — Ambulatory Visit (INDEPENDENT_AMBULATORY_CARE_PROVIDER_SITE_OTHER): Payer: Managed Care, Other (non HMO) | Admitting: Family Medicine

## 2015-07-04 ENCOUNTER — Encounter: Payer: Self-pay | Admitting: Family Medicine

## 2015-07-04 ENCOUNTER — Other Ambulatory Visit (HOSPITAL_COMMUNITY)
Admission: RE | Admit: 2015-07-04 | Discharge: 2015-07-04 | Disposition: A | Discharge status: Home / Self Care | Payer: Managed Care, Other (non HMO) | Source: Ambulatory Visit | Attending: Family Medicine | Admitting: Family Medicine

## 2015-07-04 ENCOUNTER — Telehealth: Payer: Self-pay | Admitting: Internal Medicine

## 2015-07-04 VITALS — BP 102/60 | HR 74 | Temp 98.0°F | Wt 176.0 lb

## 2015-07-04 DIAGNOSIS — N760 Acute vaginitis: Secondary | ICD-10-CM

## 2015-07-04 DIAGNOSIS — Z113 Encounter for screening for infections with a predominantly sexual mode of transmission: Secondary | ICD-10-CM | POA: Diagnosis not present

## 2015-07-04 NOTE — Progress Notes (Signed)
HPI:  Acute visit for vulvovaginal pruritis: -started 4 days ago after starting antibiotic and prednisone for a sinus infection 1 week ago -denies: new lotions, new vaginal products, vaginal discharge, sexual activity or concern for STI, dysuria, fevers, pelvic pain, abd pain, NVD -reports already tried one diflucan  -she asks about probitics  ROS: See pertinent positives and negatives per HPI.  Past Medical History  Diagnosis Date  . 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 EXOGENOUS OBESITY 06/22/2010  . DEPRESSION 03/04/2008  . ALLERGIC RHINITIS 03/04/2008  . Prolactinoma ([place])   . Edema     pedal  . Anxiety     Past Surgical History  Procedure Laterality Date  . Breast surgery  2004    augmentation  . Liposuction    . Nasal sinus surgery      Family History  Problem Relation Age of Onset  . Arthritis Mother     osteo    Social History   Social History  . Marital Status: Married    Spouse Name: N/A  . Number of Children: N/A  . Years of Education: N/A   Social History Main Topics  . Smoking status: Never Smoker   . Smokeless tobacco: Never Used  . Alcohol Use: No  . Drug Use: No  . Sexual Activity: Not Currently   Other Topics Concern  . None   Social History Narrative     Current outpatient prescriptions:  .  cabergoline (DOSTINEX) 0.5 MG tablet, Take 0.5 mg by mouth 2 (two) times a week. , Disp: , Rfl:  .  cetirizine (ZYRTEC) 10 MG tablet, Take 10 mg by mouth daily., Disp: , Rfl:  .  Fish Oil-Cholecalciferol (FISH OIL + D3) 1000-1000 MG-UNIT CAPS, Take by mouth., Disp: , Rfl:  .  Flaxseed, Linseed, (FLAX SEEDS PO), Take by mouth., Disp: , Rfl:  .  fluconazole (DIFLUCAN) 150 MG tablet, Take 1 tablet (150 mg total) by mouth once., Disp: 1 tablet, Rfl: 0 .  fluticasone (FLONASE) 50 MCG/ACT nasal spray, Place 2 sprays into both nostrils daily., Disp: 16 g, Rfl: 6 .  HEATHER 0.35 MG tablet, Take 1 tablet by mouth daily. , Disp: , Rfl:  .  ibuprofen (ADVIL)  200 MG tablet, Take 800 mg by mouth every 8 (eight) hours as needed., Disp: , Rfl:  .  LORazepam (ATIVAN) 1 MG tablet, Take 1 tablet (1 mg total) by mouth 2 (two) times daily as needed for anxiety., Disp: 10 tablet, Rfl: 0 .  predniSONE (DELTASONE) 20 MG tablet, Take 1 tablet (20 mg total) by mouth 2 (two) times daily with a meal., Disp: 12 tablet, Rfl: 0 .  venlafaxine XR (EFFEXOR-XR) 150 MG 24 hr capsule, Take 1 capsule (150 mg total) by mouth daily., Disp: 30 capsule, Rfl: 3 .  venlafaxine XR (EFFEXOR-XR) 150 MG 24 hr capsule, TAKE 1 CAPSULE BY MOUTH DAILY, Disp: 30 capsule, Rfl: 5 .  venlafaxine XR (EFFEXOR-XR) 75 MG 24 hr capsule, TAKE 1 CAPSULE BY MOUTH ONCE DAILY, Disp: 30 capsule, Rfl: 5  EXAM:  Filed Vitals:   07/04/15 1515  BP: 102/60  Pulse: 74  Temp: 98 F (36.7 C)    Body mass index is 32.18 kg/(m^2).  GENERAL: vitals reviewed and listed above, alert, oriented, appears well hydrated and in no acute distress  HEENT: atraumatic, conjunttiva clear, no obvious abnormalities on inspection of external nose and ears  NECK: no obvious masses on inspection  GU: minimal vulvar irritation, white thick vaginal discharge, no CMT  MS: moves all extremities without noticeable abnormality  PSYCH: pleasant and cooperative, no obvious depression or anxiety  ASSESSMENT AND PLAN:  Discussed the following assessment and plan:  Vaginitis and vulvovaginitis  likely yeast vv vs BV -tx per below with diflucan in 1 week if not improving -GC/chlam, yeast, BV, trich pending -Patient advised to return or notify a doctor immediately if symptoms worsen or persist or new concerns arise.  Patient Instructions  Use pea sized amount of clotrimazole cream with pea sized amount of hydrocortisone cream twice daily to affected area for 1 -2 weeks  Wear loose cotton clothing if possible  Align or Culturelle probiotic for 4 weeks  No douching or soaps in this area  Follow up if symptoms worsen  or persist or other concerns  -We have ordered labs or studies at this visit. It can take up to 1-2 weeks for results and processing. We will contact you with instructions IF your results are abnormal. Normal results will be released to your [facility]. If you have not heard from [place] or can not find your results in [facility] in 2 weeks please contact our office.            Colin Benton R.

## 2015-07-04 NOTE — Telephone Encounter (Signed)
[facility] - Client [facility] Patient Name: Chelsea Dyer DOB: 06-18-1966 Initial Comment Caller states she took Diflucan, and still on antibiotics- still having a itch. Nurse Assessment Nurse: Markus Daft, RN, Sherre Poot Date/Time (Eastern Time): 07/04/2015 8:26:57 AM Confirm and document reason for call. If symptomatic, describe symptoms. ---Caller states she has had external vaginal itching 4 days ago, and MD called in Diflucan and took Diflucan the same day. No discharge. No fever. -- Still on antibiotics since Saturday, 05/29/15, for a sinus infection, but worried that it will make things worse. She was seen at [place] for the sinus infection. She is feeling better. Has the patient traveled out of the country within the last 30 days? ---Not Applicable Does the patient have any new or worsening symptoms? ---Yes Will a triage be completed? ---Yes Related visit to physician within the last 2 weeks? ---Yes Does the PT have any chronic conditions? (i.e. diabetes, asthma, etc.) ---Yes List chronic conditions. ---Depression. Seasonal allergies Did the patient indicate they were pregnant? ---No Guidelines Guideline Title Affirmed Question Affirmed Notes Vulvar Symptoms MODERATE-SEVERE itching (i.e., interferes with school, work, or sleep) rates itching 8/10 and has used vaginal wipes to help control it Final Disposition User See Physician within [place], RN, [place] Comments No appts with Dr. Burnice Logan available. Appt made with Dr. Colin Benton for 3:30 pm today. Referrals REFERRED TO PCP OFFICE Disagree/Comply: Comply

## 2015-07-04 NOTE — Addendum Note (Signed)
Addended byCloyd Stagers B on: 07/04/2015 04:04 PM   Modules accepted: Orders

## 2015-07-04 NOTE — Patient Instructions (Addendum)
Use pea sized amount of clotrimazole cream with pea sized amount of hydrocortisone cream twice daily to affected area for 1 -2 weeks  Wear loose cotton clothing if possible  Align or Culturelle probiotic for 4 weeks  No douching or soaps in this area  Follow up if symptoms worsen or persist or other concerns  -We have ordered labs or studies at this visit. It can take up to 1-2 weeks for results and processing. We will contact you with instructions IF your results are abnormal. Normal results will be released to your [facility]. If you have not heard from [place] or can not find your results in [facility] in 2 weeks please contact our office.

## 2015-07-05 ENCOUNTER — Other Ambulatory Visit: Payer: Self-pay | Admitting: Internal Medicine

## 2015-07-08 LAB — CERVICOVAGINAL ANCILLARY ONLY
Chlamydia: NEGATIVE
Neisseria Gonorrhea: NEGATIVE
Trichomonas: NEGATIVE

## 2015-07-11 LAB — CERVICOVAGINAL ANCILLARY ONLY
Bacterial vaginitis: NEGATIVE
Candida vaginitis: NEGATIVE

## 2015-07-15 ENCOUNTER — Ambulatory Visit (INDEPENDENT_AMBULATORY_CARE_PROVIDER_SITE_OTHER): Payer: Managed Care, Other (non HMO) | Admitting: Podiatry

## 2015-07-15 ENCOUNTER — Ambulatory Visit (INDEPENDENT_AMBULATORY_CARE_PROVIDER_SITE_OTHER): Payer: Managed Care, Other (non HMO)

## 2015-07-15 ENCOUNTER — Encounter: Payer: Self-pay | Admitting: Podiatry

## 2015-07-15 VITALS — BP 110/66 | HR 79 | Resp 16

## 2015-07-15 DIAGNOSIS — Z9889 Other specified postprocedural states: Secondary | ICD-10-CM

## 2015-07-15 NOTE — Progress Notes (Signed)
She presents today for follow-up of her capsulitis second metatarsophalangeal joint right foot status post hammertoe repair and second metatarsal osteotomy. She states that at this point is 100% improved and she is very happy with the outcome.  Objective: Vital signs are stable she is alert and oriented 3 she has no erythema edema cellulitis drainage or odor. She has great range of motion of the second metatarsophalangeal joint as she displays with dorsiflexion and plantarflexion and there is no crepitation on manual motion. Radiograph confirms well-healed osteotomy second metatarsal and double screw fixation.  Assessment: Well-healing capsulitis second metatarsal osteotomy and hammertoe repair second right.  Plan: Follow up with her on an as-needed basis. She is released.  Roselind Messier DPM

## 2015-08-04 ENCOUNTER — Other Ambulatory Visit: Payer: Self-pay | Admitting: Internal Medicine

## 2015-11-18 ENCOUNTER — Ambulatory Visit (INDEPENDENT_AMBULATORY_CARE_PROVIDER_SITE_OTHER): Payer: Managed Care, Other (non HMO) | Admitting: Allergy and Immunology

## 2015-11-18 ENCOUNTER — Encounter: Payer: Self-pay | Admitting: Allergy and Immunology

## 2015-11-18 VITALS — BP 128/84 | HR 72 | Temp 97.6°F | Resp 20 | Ht 61.42 in | Wt 181.9 lb

## 2015-11-18 DIAGNOSIS — G43109 Migraine with aura, not intractable, without status migrainosus: Secondary | ICD-10-CM

## 2015-11-18 DIAGNOSIS — H1010 Acute atopic conjunctivitis, unspecified eye: Secondary | ICD-10-CM | POA: Diagnosis not present

## 2015-11-18 DIAGNOSIS — J309 Allergic rhinitis, unspecified: Secondary | ICD-10-CM

## 2015-11-18 DIAGNOSIS — G43909 Migraine, unspecified, not intractable, without status migrainosus: Secondary | ICD-10-CM

## 2015-11-18 MED ORDER — BEPOTASTINE BESILATE 1.5 % OP SOLN: OPHTHALMIC | Status: DC

## 2015-11-18 MED ORDER — EPINEPHRINE 0.3 MG/0.3ML IJ SOAJ: INTRAMUSCULAR | Status: DC

## 2015-11-18 MED ORDER — MONTELUKAST SODIUM 10 MG PO TABS: 10.0000 mg | ORAL_TABLET | Freq: Every day | ORAL | Status: DC

## 2015-11-18 MED ORDER — AZELASTINE HCL 0.1 % NA SOLN: NASAL | Status: DC

## 2015-11-18 NOTE — Patient Instructions (Addendum)
  1. Allergen avoidance measures  2. Slowly taper off all forms of caffeine over the next 4 weeks  3. Taper off all analgesic use  4. Treat and prevent inflammation:   A. montelukast 10 mg one tablet one time per day  B. OTC Rhinocort one spray each nostril one time per day. Coupon.  5. If needed:   A. Astelin 2 sprays each nostril twice a day  B. Bepreve 1 drop each eye twice a day. Coupon  C. Cetirizine 1 tablet 1-2 times per day  6. Consider a course of immunotherapy  7. Return to clinic in 4 weeks  8. Review CT study result from Dr. Redmond Baseman

## 2015-11-18 NOTE — Progress Notes (Signed)
Dear Dr. Burnice Logan,  Thank you for referring Chelsea Dyer to the [facility] on 11/18/2015.   Below is a summation of this patient's evaluation and recommendations.  Thank you for your referral. I will keep you informed about this patient's response to treatment.   If you have any questions please to do hestitate to contact me.   Sincerely,  Jiles Prows, MD [place]   ______________________________________________________________________    NEW PATIENT NOTE  Referring Provider: Marletta Lor, MD Primary Provider: Nyoka Cowden, MD Date of office visit: 11/18/2015    Subjective:   Chief Complaint:  Chelsea Dyer (DOB: May 05, 1966) is a 50 y.o. female with a chief complaint of Allergic Rhinitis  and Pruritis  who presents to the clinic on 11/18/2015 with the following problems:  HPI Comments: Chelsea Dyer presents to this clinic in evaluation of allergies. She has a long history of allergies requiring a course of immunotherapy while living in [place] which she terminated about 7 years ago and she did quite well up until last fall. At that point in time she developed problems with sneezing and constant postnasal drip and blowing of her nose and apparently had recurrent sinus infections for which she saw Dr. Redmond Baseman who gave her antibiotics and steroids on 2 occasions and then follow up with a CT scan of her sinuses which may of identified some persistent frontal sinus disease. Apparently she had sinus surgery about 30 years ago. Presently, she has postnasal drip and sneezing and no blowing of her nose without any anosmia or ugly nasal discharge and she has itchy red watery eyes. Most of her symptoms appear to exacerbate during the spring but they are present throughout the year. Provoking factors include locating to the outdoors and exposure to mold and dust. She has tried Flonase and  Astelin which may help her somewhat. She's tried Investment banker, operational and she thinks that Zyrtec may help her if she takes 2 tablets daily.  In addition, states he has problems with headaches. She has a daily headache located over her left frontal and left temporal region and sometimes migrating to the base of her left neck. She's had these for many years. She's consistently taking Advil twice a day to relieve this headache. She drinks at least 3 diet Cokes per day and rarely has chocolate.  Chelsea Dyer also has problems with sleep. She has restless sleep. When she comes home from work she somewhat exhausted and usually goes to bed around 9 and wakes up at midnight for about an hour. Sometimes she is intermittently unrefreshed in the morning. She'll take a nap on the weekend. She does not appear to get sleepy while driving.   Past Medical History  Diagnosis Date  . 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 EXOGENOUS OBESITY 06/22/2010  . DEPRESSION 03/04/2008  . ALLERGIC RHINITIS 03/04/2008  . Prolactinoma ([place])   . Edema     pedal  . Anxiety     Past Surgical History  Procedure Laterality Date  . Breast surgery  2004    augmentation  . Liposuction    . Nasal sinus surgery    . Hammer toe surgery      Outpatient Prescriptions Prior to Visit  Medication Sig Dispense Refill  . cabergoline (DOSTINEX) 0.5 MG tablet Take 0.5 mg by mouth 2 (two) times a week.     . cetirizine (ZYRTEC) 10 MG tablet Take 20  mg by mouth daily.     . Fish Oil-Cholecalciferol (FISH OIL + D3) 1000-1000 MG-UNIT CAPS Take by mouth.    . Flaxseed, Linseed, (FLAX SEEDS PO) Take by mouth.    Marland Kitchen HEATHER 0.35 MG tablet Take 1 tablet by mouth daily.     Marland Kitchen ibuprofen (ADVIL) 200 MG tablet Take 800 mg by mouth every 8 (eight) hours as needed.    . venlafaxine XR (EFFEXOR-XR) 150 MG 24 hr capsule TAKE 1 CAPSULE (150 MG TOTAL) BY MOUTH DAILY. 30 capsule 5  . venlafaxine XR (EFFEXOR-XR) 75 MG 24 hr capsule TAKE 1 CAPSULE BY MOUTH ONCE  DAILY 30 capsule 5  . venlafaxine XR (EFFEXOR-XR) 150 MG 24 hr capsule TAKE 1 CAPSULE BY MOUTH DAILY 30 capsule 5  . fluconazole (DIFLUCAN) 150 MG tablet Take 1 tablet (150 mg total) by mouth once. (Patient not taking: Reported on 11/18/2015) 1 tablet 0  . fluticasone (FLONASE) 50 MCG/ACT nasal spray Place 2 sprays into both nostrils daily. (Patient not taking: Reported on 11/18/2015) 16 g 6  . LORazepam (ATIVAN) 1 MG tablet Take 1 tablet (1 mg total) by mouth 2 (two) times daily as needed for anxiety. (Patient not taking: Reported on 11/18/2015) 10 tablet 0   No facility-administered medications prior to visit.    Allergies  Allergen Reactions  . Percocet [Oxycodone-Acetaminophen] Other (See Comments)    Interfered with patient's antidepressants; made pt feel dizzy  . Septra [Sulfamethoxazole-Trimethoprim] Rash    Review of systems negative except as noted in HPI / PMHx or noted below:  Review of Systems  Constitutional: Negative.   HENT: Negative.   Eyes: Negative.   Respiratory: Negative.   Cardiovascular: Negative.   Gastrointestinal: Negative.   Genitourinary: Negative.   Musculoskeletal: Negative.   Skin: Negative.   Neurological: Negative.   Endo/Heme/Allergies: Negative.   Psychiatric/Behavioral: Negative.     Family History  Problem Relation Age of Onset  . Arthritis Mother     osteo  . Allergic rhinitis Mother     Social History   Social History  . Marital Status: Married    Spouse Name: N/A  . Number of Children: N/A  . Years of Education: N/A   Occupational History  . Not on file.   Social History Main Topics  . Smoking status: Never Smoker   . Smokeless tobacco: Never Used  . Alcohol Use: No  . Drug Use: No  . Sexual Activity: Not Currently   Other Topics Concern  . Not on file   Social History Narrative    Environmental and Social history  Lives in a house with a dry environment, no carpeting in the bedroom, a dog located inside the household,  no plastic on the bed or pillow, and no smoking ongoing with inside the household. She works in [employer].   Objective:   Filed Vitals:   11/18/15 0842  BP: 128/84  Pulse: 72  Temp: 97.6 F (36.4 C)  Resp: 20   Height: 5' 1.42" (156 cm) Weight: 181 lb 14.1 oz (82.5 kg)  Physical Exam  Constitutional: She is well-developed, well-nourished, and in no distress.  Allergic shiners  HENT:  Head: Normocephalic.  Right Ear: Tympanic membrane, external ear and ear canal normal.  Left Ear: Tympanic membrane, external ear and ear canal normal.  Nose: Nose normal. No mucosal edema or rhinorrhea.  Mouth/Throat: Uvula is midline, oropharynx is clear and moist and mucous membranes are normal. No oropharyngeal exudate.  Eyes: Conjunctivae are  normal.  Neck: Trachea normal. No tracheal tenderness present. No tracheal deviation present. No thyromegaly present.  Cardiovascular: Normal rate, regular rhythm, S1 normal, S2 normal and normal heart sounds.   No murmur heard. Pulmonary/Chest: Breath sounds normal. No stridor. No respiratory distress. She has no wheezes. She has no rales.  Musculoskeletal: She exhibits no edema.  Lymphadenopathy:       Head (right side): No tonsillar adenopathy present.       Head (left side): No tonsillar adenopathy present.    She has no cervical adenopathy.    She has no axillary adenopathy.  Neurological: She is alert. Gait normal.  Skin: No rash noted. She is not diaphoretic. No erythema. Nails show no clubbing.  Psychiatric: Mood and affect normal.    Diagnostics: Allergy skin tests were performed. She demonstrated hypersensitivity against house dust mite and trees   Assessment and Plan:    1. Allergic rhinoconjunctivitis   2. Migraine syndrome     1. Allergen avoidance measures  2. Slowly taper off all forms of caffeine over the next 4 weeks  3. Taper off all analgesic use  4. Treat and prevent inflammation:   A. montelukast 10 mg one  tablet one time per day  B. OTC Rhinocort one spray each nostril one time per day. Coupon.  5. If needed:   A. Astelin 2 sprays each nostril twice a day  B. Bepreve 1 drop each eye twice a day. Coupon  C. Cetirizine 1 tablet 1-2 times per day  6. Consider a course of immunotherapy  7. Return to clinic in 4 weeks  8. Review CT study result from Dr. Lucretia Field appears to have rather significant atopic disease affecting her upper airways and eyes and we'll get her to perform allergen avoidance measures as best as possible and consistently use some anti-inflammatory medications delivered to her respiratory tract as noted above. If she fails this form of therapy she would definitely be a candidate for immunotherapy. As well, she appears to have significant cephalgia with a component of transform migraines most likely triggered by her consistent caffeine use and she is complicated her situation by rolling into a component of medication overuse headaches with her daily Advil use. We'll get her to slowly taper off all caffeine and analgesic use over the course the next 4 weeks. Further evaluation treatment will be based upon her response.  Jiles Prows, MD [facility]

## 2015-11-20 ENCOUNTER — Other Ambulatory Visit: Payer: Self-pay | Admitting: Allergy and Immunology

## 2015-11-20 DIAGNOSIS — H1010 Acute atopic conjunctivitis, unspecified eye: Secondary | ICD-10-CM

## 2015-11-20 DIAGNOSIS — N939 Abnormal uterine and vaginal bleeding, unspecified: Secondary | ICD-10-CM

## 2015-11-20 DIAGNOSIS — J309 Allergic rhinitis, unspecified: Principal | ICD-10-CM

## 2015-11-22 DIAGNOSIS — J301 Allergic rhinitis due to pollen: Secondary | ICD-10-CM | POA: Diagnosis not present

## 2015-11-24 ENCOUNTER — Other Ambulatory Visit: Payer: Self-pay

## 2015-11-24 MED ORDER — EPINEPHRINE 0.3 MG/0.3ML IJ SOAJ: INTRAMUSCULAR | Status: DC

## 2015-11-24 NOTE — Telephone Encounter (Signed)
Generic Epi-pen placed for refill instead of trade name

## 2015-11-25 DIAGNOSIS — J3089 Other allergic rhinitis: Secondary | ICD-10-CM | POA: Diagnosis not present

## 2015-12-01 ENCOUNTER — Ambulatory Visit (INDEPENDENT_AMBULATORY_CARE_PROVIDER_SITE_OTHER): Payer: Managed Care, Other (non HMO)

## 2015-12-01 DIAGNOSIS — J309 Allergic rhinitis, unspecified: Secondary | ICD-10-CM

## 2015-12-01 NOTE — Progress Notes (Signed)
Immunotherapy   Patient Details  Name: Chelsea Dyer MRN: [id number] Date of Birth: 03/30/1966  12/01/2015  Chelsea Dyer came in today to start ITX. Following schedule:  A  Frequency:  1-2 times weekly Epi-Pen:  Chelsea Dyer states she cannot afford EpiPen due to [employer]  I ordered sample EpiPen from [facility] rep). Consent signed and patient instructions given, along with copy of injection room hours.   Patient waited 30 minutes after injections without any problems.     Chelsea Dyer 12/01/2015, 11:51 AM

## 2015-12-06 ENCOUNTER — Other Ambulatory Visit: Payer: Self-pay | Admitting: Internal Medicine

## 2015-12-08 ENCOUNTER — Ambulatory Visit (INDEPENDENT_AMBULATORY_CARE_PROVIDER_SITE_OTHER): Payer: Managed Care, Other (non HMO)

## 2015-12-08 DIAGNOSIS — J309 Allergic rhinitis, unspecified: Secondary | ICD-10-CM

## 2015-12-19 ENCOUNTER — Encounter: Payer: Self-pay | Admitting: Podiatry

## 2015-12-19 ENCOUNTER — Ambulatory Visit (INDEPENDENT_AMBULATORY_CARE_PROVIDER_SITE_OTHER): Payer: Managed Care, Other (non HMO)

## 2015-12-19 ENCOUNTER — Ambulatory Visit (INDEPENDENT_AMBULATORY_CARE_PROVIDER_SITE_OTHER): Payer: Managed Care, Other (non HMO) | Admitting: Podiatry

## 2015-12-19 VITALS — BP 122/78 | HR 86 | Resp 18

## 2015-12-19 DIAGNOSIS — M7740 Metatarsalgia, unspecified foot: Secondary | ICD-10-CM

## 2015-12-19 DIAGNOSIS — R52 Pain, unspecified: Secondary | ICD-10-CM

## 2015-12-19 DIAGNOSIS — M2010 Hallux valgus (acquired), unspecified foot: Secondary | ICD-10-CM | POA: Diagnosis not present

## 2015-12-19 MED ORDER — DICLOFENAC SODIUM 75 MG PO TBEC: 75.0000 mg | DELAYED_RELEASE_TABLET | Freq: Two times a day (BID) | ORAL | Status: DC

## 2015-12-23 ENCOUNTER — Ambulatory Visit (INDEPENDENT_AMBULATORY_CARE_PROVIDER_SITE_OTHER): Payer: Managed Care, Other (non HMO) | Admitting: *Deleted

## 2015-12-23 DIAGNOSIS — J309 Allergic rhinitis, unspecified: Secondary | ICD-10-CM | POA: Diagnosis not present

## 2015-12-25 NOTE — Progress Notes (Signed)
Patient ID: Chelsea Dyer, female   DOB: 13-Dec-1965, 50 y.o.   MRN: [id number]  Subjective: 50 year old female presents the office today with pain both of her feet which she describes as an intermittent sharp pain under the big toe into the ball the foot. She denies any recent injury or trauma. This is been ongoing for greater than one month. There is injury or trauma. She has not had any swelling or redness. No recent treatment. Denies any systemic complaints such as fevers, chills, nausea, vomiting. No acute changes since last appointment, and no other complaints at this time.   Objective: AAO x3, NAD DP/PT pulses palpable bilaterally, CRT less than 3 seconds Protective sensation intact with Simms Weinstein monofilament Scar from prior surgery right foot well-healed. There is mild diffuse tenderness on metatarsal heads plantarly bilaterally and there is atrophy of fat pad and prominence of the metatarsal heads. 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 There is no area pinpoint bony tenderness there is no pain vibratory sensation. His neuroma identified at this time. No areas of pinpoint bony tenderness or pain with vibratory sensation. MMT 5/5, ROM WNL. No edema, erythema, increase in warmth to bilateral lower extremities.  No open lesions or pre-ulcerative lesions.  No pain with calf compression, swelling, warmth, erythema  Assessment: 50 year old female bilateral metatarsalgia, HAV  Plan: -All treatment options discussed with the patient including all alternatives, risks, complications.  -X-rays were obtained and reviewed with the patient.  -I discussed orthotics as well as shoe gear changes. -Dispensed metatarsal pads. -Discussed injection but she wishes to hold off. -Voltaren prn -Follow-up in 6 weeks or sooner if any problems arise. In the meantime, encouraged to call the office with any questions, concerns, change in symptoms.   [name], DPM

## 2015-12-29 ENCOUNTER — Ambulatory Visit (INDEPENDENT_AMBULATORY_CARE_PROVIDER_SITE_OTHER): Payer: Managed Care, Other (non HMO)

## 2015-12-29 DIAGNOSIS — J309 Allergic rhinitis, unspecified: Secondary | ICD-10-CM

## 2016-01-01 ENCOUNTER — Other Ambulatory Visit: Payer: Self-pay | Admitting: Internal Medicine

## 2016-01-05 ENCOUNTER — Ambulatory Visit (INDEPENDENT_AMBULATORY_CARE_PROVIDER_SITE_OTHER): Payer: Managed Care, Other (non HMO)

## 2016-01-05 DIAGNOSIS — J309 Allergic rhinitis, unspecified: Secondary | ICD-10-CM | POA: Diagnosis not present

## 2016-01-12 ENCOUNTER — Ambulatory Visit (INDEPENDENT_AMBULATORY_CARE_PROVIDER_SITE_OTHER): Payer: Managed Care, Other (non HMO)

## 2016-01-12 DIAGNOSIS — J309 Allergic rhinitis, unspecified: Secondary | ICD-10-CM

## 2016-01-19 ENCOUNTER — Ambulatory Visit (INDEPENDENT_AMBULATORY_CARE_PROVIDER_SITE_OTHER): Payer: Managed Care, Other (non HMO) | Admitting: *Deleted

## 2016-01-19 DIAGNOSIS — J309 Allergic rhinitis, unspecified: Secondary | ICD-10-CM

## 2016-01-26 ENCOUNTER — Ambulatory Visit (INDEPENDENT_AMBULATORY_CARE_PROVIDER_SITE_OTHER): Payer: Managed Care, Other (non HMO) | Admitting: *Deleted

## 2016-01-26 DIAGNOSIS — J309 Allergic rhinitis, unspecified: Secondary | ICD-10-CM

## 2016-01-29 ENCOUNTER — Ambulatory Visit: Payer: Managed Care, Other (non HMO) | Admitting: Podiatry

## 2016-01-29 IMAGING — CR DG CHEST 2V
2 series · 2 of 2 positions shown · non-contrast
Comparison: None.

CLINICAL DATA: Two week history of mid chest pain when walking.
Shortness of breath. Sensation of food sticking in the mid esophagus
yesterday. Nonsmoker.

EXAM:
CHEST  2 VIEW

[chest pa]
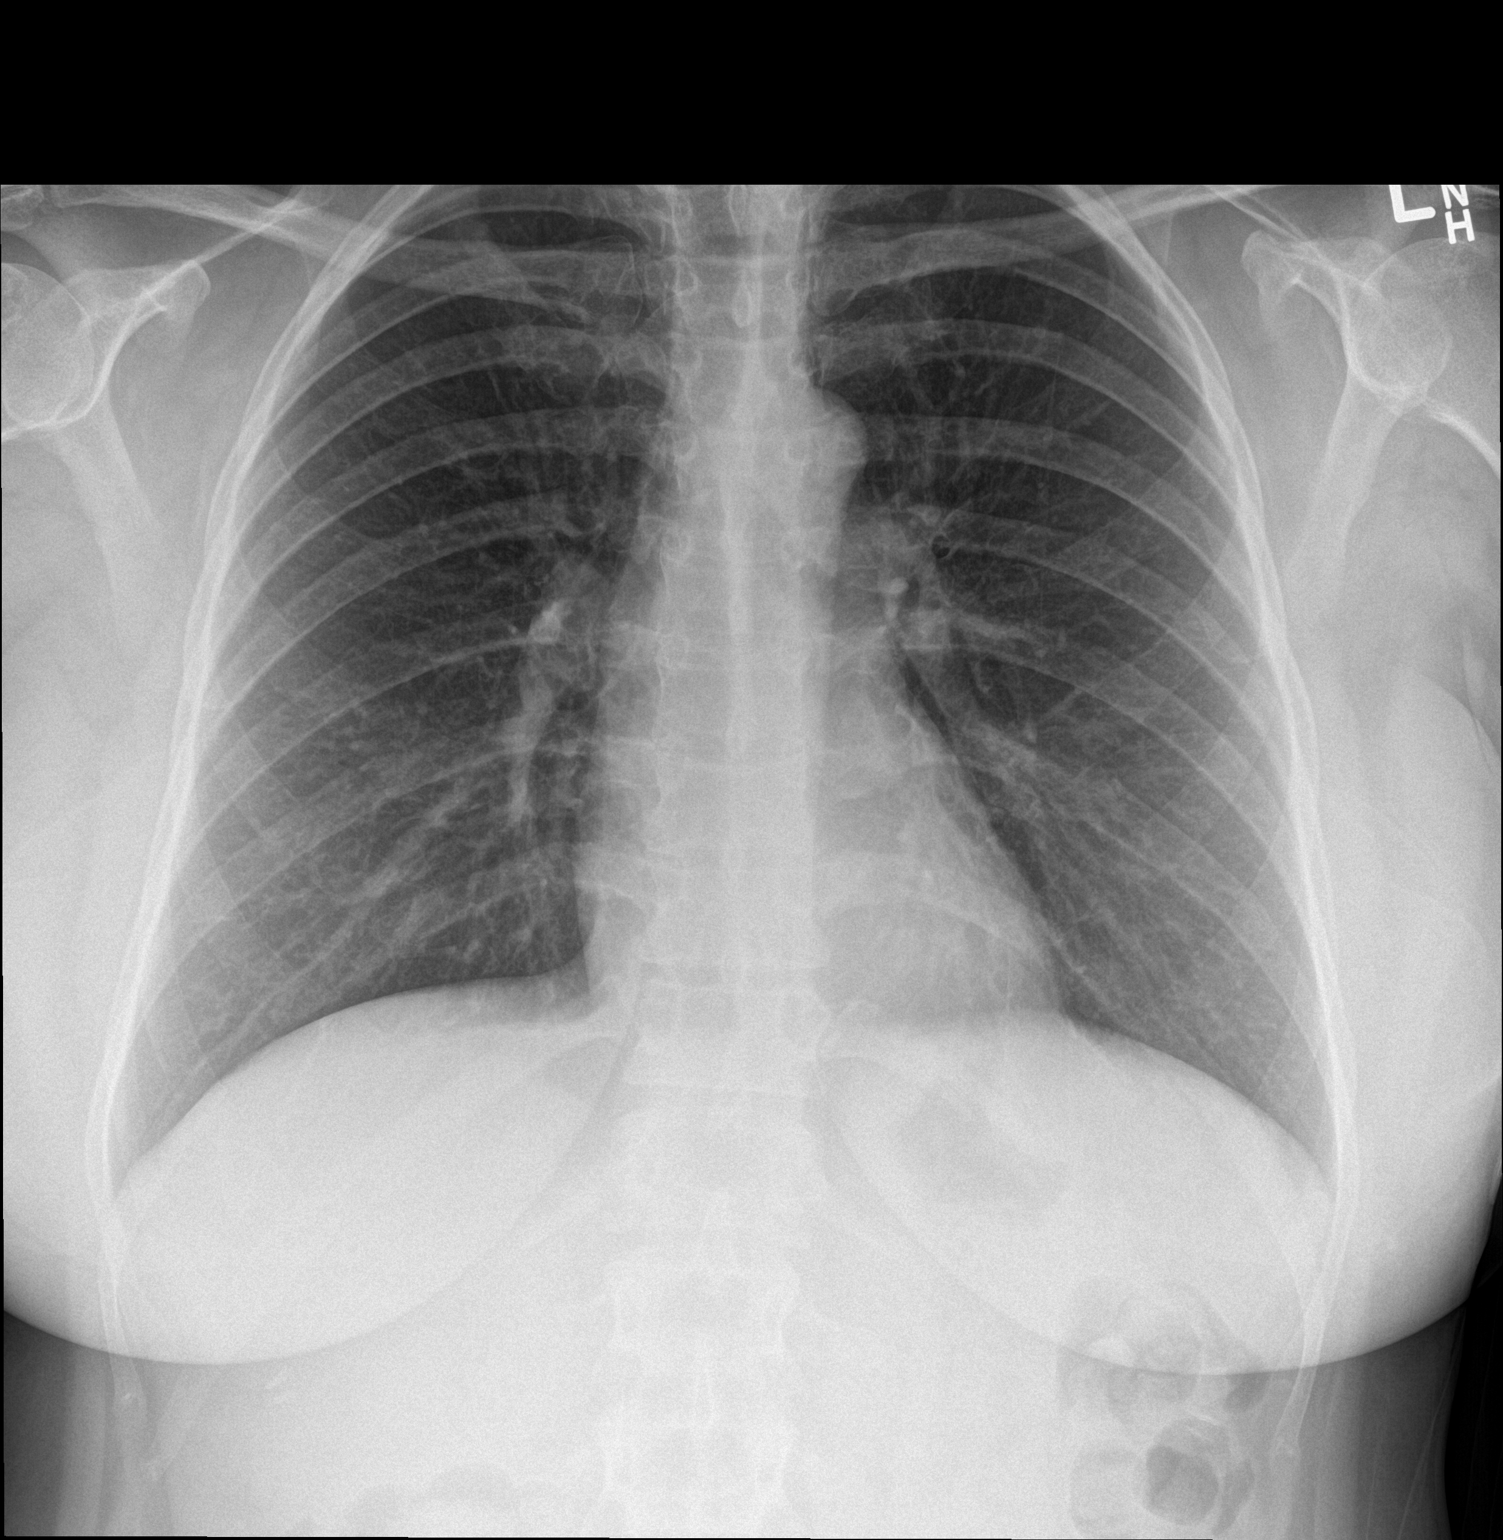

[chest lat]
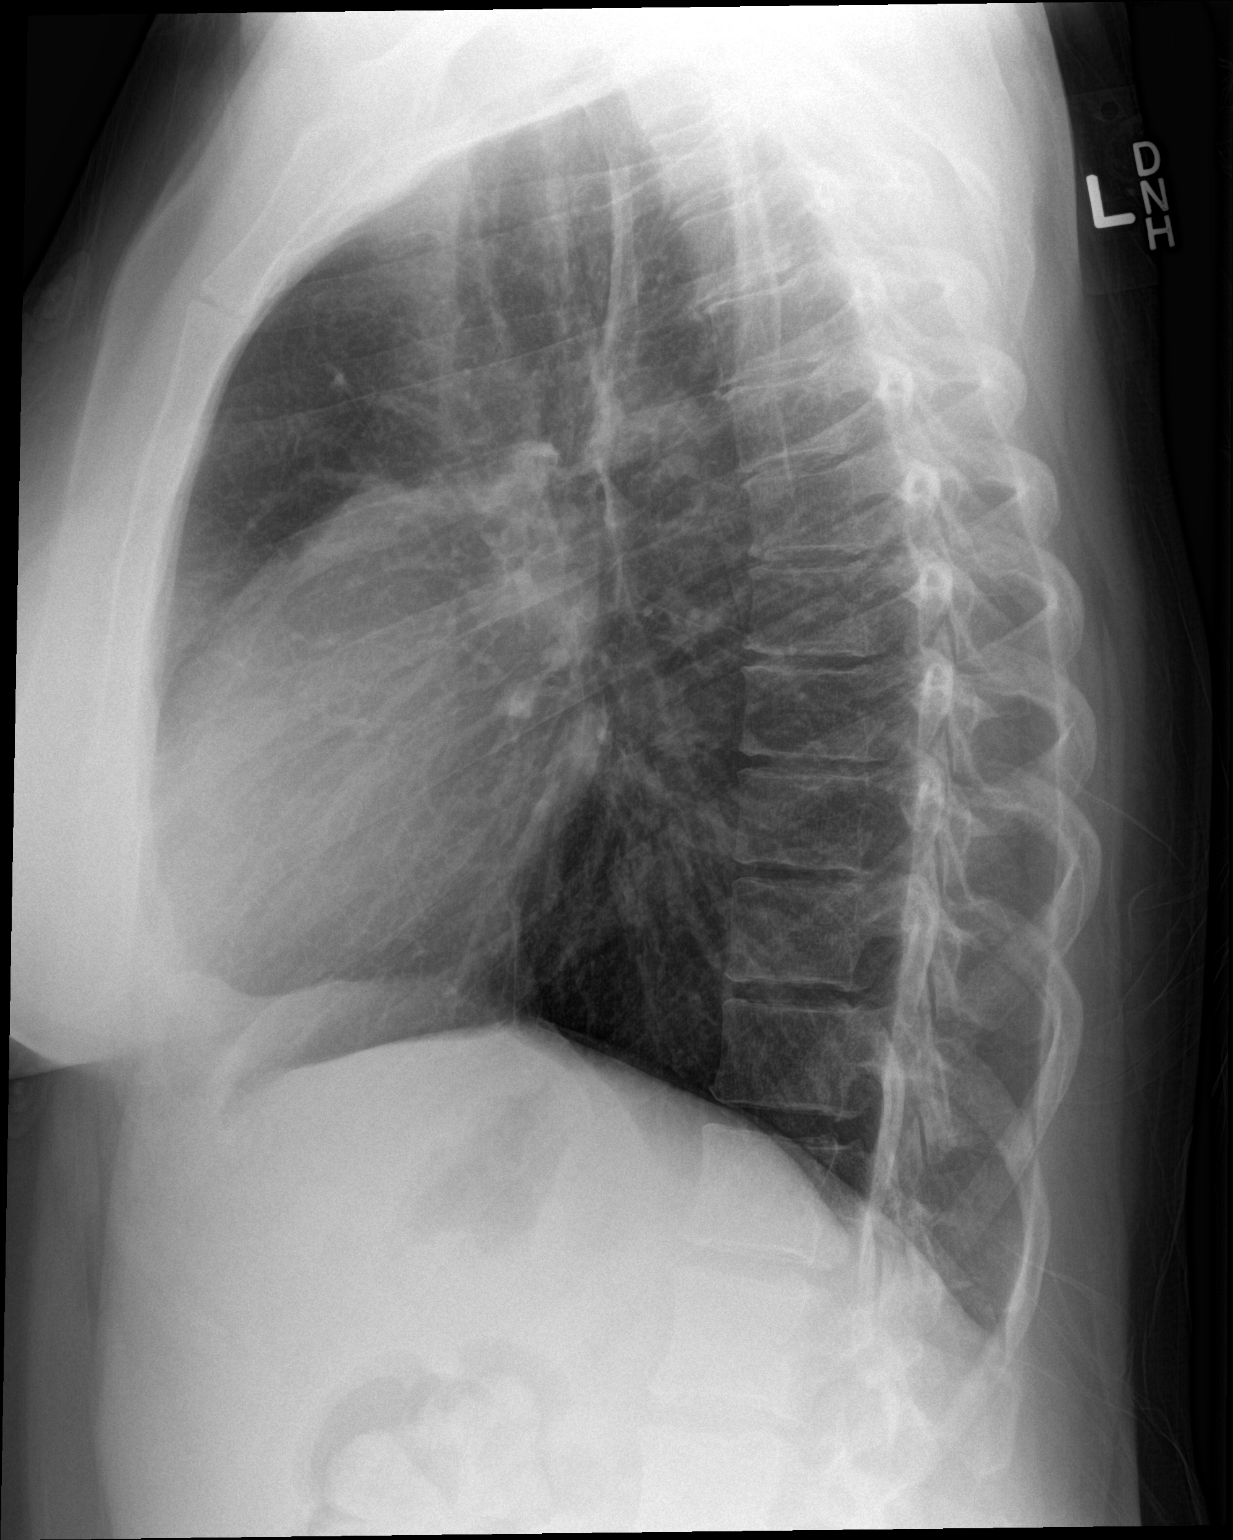

[2 of 2 positions shown; findings below may reference images not displayed]

FINDINGS: Cardiomediastinal silhouette unremarkable. Lungs clear.
Bronchovascular markings normal. Pulmonary vascularity normal. No
visible pleural effusions. No pneumothorax. Mild degenerative
changes involving the thoracic spine.
IMPRESSION: No acute cardiopulmonary disease.

## 2016-02-02 ENCOUNTER — Ambulatory Visit (INDEPENDENT_AMBULATORY_CARE_PROVIDER_SITE_OTHER): Payer: Managed Care, Other (non HMO)

## 2016-02-02 DIAGNOSIS — J309 Allergic rhinitis, unspecified: Secondary | ICD-10-CM | POA: Diagnosis not present

## 2016-02-10 ENCOUNTER — Ambulatory Visit (INDEPENDENT_AMBULATORY_CARE_PROVIDER_SITE_OTHER): Payer: Managed Care, Other (non HMO)

## 2016-02-10 DIAGNOSIS — J309 Allergic rhinitis, unspecified: Secondary | ICD-10-CM | POA: Diagnosis not present

## 2016-02-17 ENCOUNTER — Ambulatory Visit (INDEPENDENT_AMBULATORY_CARE_PROVIDER_SITE_OTHER): Payer: Managed Care, Other (non HMO)

## 2016-02-17 DIAGNOSIS — J309 Allergic rhinitis, unspecified: Secondary | ICD-10-CM

## 2016-02-24 ENCOUNTER — Ambulatory Visit (INDEPENDENT_AMBULATORY_CARE_PROVIDER_SITE_OTHER): Payer: Managed Care, Other (non HMO) | Admitting: *Deleted

## 2016-02-24 DIAGNOSIS — J309 Allergic rhinitis, unspecified: Secondary | ICD-10-CM | POA: Diagnosis not present

## 2016-03-01 ENCOUNTER — Telehealth: Payer: Self-pay | Admitting: Allergy and Immunology

## 2016-03-01 NOTE — Telephone Encounter (Signed)
Pt pays online - some pmts are being applied to [place], some are being applied to other dr - explained to pt - she was not happy about that

## 2016-03-01 NOTE — Telephone Encounter (Signed)
Patient stated she sent in a check about a month ago and it seems as if her balance does not reflect her payment. Please call her back.

## 2016-03-02 ENCOUNTER — Telehealth: Payer: Self-pay | Admitting: Allergy and Immunology

## 2016-03-02 MED ORDER — AMOXICILLIN-POT CLAVULANATE 875-125 MG PO TABS: 1.0000 | ORAL_TABLET | Freq: Two times a day (BID) | ORAL | Status: AC

## 2016-03-02 MED ORDER — FLUCONAZOLE 150 MG PO TABS: 150.0000 mg | ORAL_TABLET | Freq: Once | ORAL | Status: DC

## 2016-03-02 NOTE — Telephone Encounter (Signed)
She says she feels that she has a sinus infection and wants to know if you can call her in an antibiotic. She is having brown and yellow drainage along with a cough and pain around her eyebrows.   She uses [facility] on [address]

## 2016-03-02 NOTE — Telephone Encounter (Signed)
Sent in refill for Diflucan. Patient informed

## 2016-03-02 NOTE — Telephone Encounter (Signed)
Informed patient of plan below.

## 2016-03-02 NOTE — Telephone Encounter (Signed)
Can we call in a Diflucan?

## 2016-03-02 NOTE — Telephone Encounter (Signed)
Can call in Diflucan 150 tab single tab.

## 2016-03-02 NOTE — Telephone Encounter (Signed)
PT CALLED AND NEED TO HAVE YOU CALL IN DIFLUCAN TO [facility] [name].  [phone].

## 2016-03-02 NOTE — Telephone Encounter (Signed)
Please provide patient Augmentin 875 one tablet two times per day for 14 days. See [place] if not better.

## 2016-03-09 ENCOUNTER — Ambulatory Visit (INDEPENDENT_AMBULATORY_CARE_PROVIDER_SITE_OTHER): Payer: Managed Care, Other (non HMO)

## 2016-03-09 DIAGNOSIS — J309 Allergic rhinitis, unspecified: Secondary | ICD-10-CM

## 2016-03-29 ENCOUNTER — Telehealth: Payer: Self-pay

## 2016-03-29 MED ORDER — FLUCONAZOLE 150 MG PO TABS: 150.0000 mg | ORAL_TABLET | Freq: Once | ORAL | Status: DC

## 2016-03-29 NOTE — Telephone Encounter (Signed)
Patient of Dr. Neldon Mc. She called the on call doctor last week and was given another antibiotic and Diflucan. Pt was supposed to take 1 tab every 12 hours, but she was only taking one ever 24 hours to prevent yeast infections. Pt decided to do the correct dosage of every 12 hours and now has another bad yeast infection. She has about 6 more antibiotics to take, and she does not have any more diflucan. She is wondering what should she do. Can she take the antibiotic with diflucan or does she need to do the anibiotic first then the diflucan.   Please Advise  Thanks   Kristopher Oppenheim on [address]

## 2016-03-29 NOTE — Telephone Encounter (Signed)
Please advise 

## 2016-03-29 NOTE — Telephone Encounter (Signed)
Left message for patient to call office to schedule appointment. Sent in Diflucan to pharmacy.

## 2016-03-29 NOTE — Telephone Encounter (Signed)
Please have patient see me in clinic as the last visit with me was the initial evaluation of march. She can take one additional tablet of Diflucan 150 for yeast infection if needed. No refills.

## 2016-03-29 NOTE — Telephone Encounter (Signed)
Informed patient called in Diflucan and scheduled appointment to see Dr. Neldon Mc tomorrow in the [place] clinic.

## 2016-03-30 ENCOUNTER — Encounter: Payer: Self-pay | Admitting: Allergy and Immunology

## 2016-03-30 ENCOUNTER — Ambulatory Visit: Payer: Self-pay | Admitting: *Deleted

## 2016-03-30 ENCOUNTER — Ambulatory Visit (INDEPENDENT_AMBULATORY_CARE_PROVIDER_SITE_OTHER): Payer: Managed Care, Other (non HMO) | Admitting: Allergy and Immunology

## 2016-03-30 VITALS — BP 126/82 | HR 76 | Resp 20

## 2016-03-30 DIAGNOSIS — J309 Allergic rhinitis, unspecified: Secondary | ICD-10-CM

## 2016-03-30 DIAGNOSIS — H1010 Acute atopic conjunctivitis, unspecified eye: Secondary | ICD-10-CM

## 2016-03-30 DIAGNOSIS — G43909 Migraine, unspecified, not intractable, without status migrainosus: Secondary | ICD-10-CM

## 2016-03-30 DIAGNOSIS — G43109 Migraine with aura, not intractable, without status migrainosus: Secondary | ICD-10-CM

## 2016-03-30 NOTE — Progress Notes (Signed)
Follow-up Note  Referring Provider: Marletta Lor, MD Primary Provider: Nyoka Cowden, MD Date of Office Visit: 03/30/2016  Subjective:   Chelsea Dyer (DOB: 1966/03/16) is a 50 y.o. female who returns to the [facility] on 03/30/2016 in re-evaluation of the following:  HPI: Marzetta Board returns to this clinic in reevaluation of her allergic rhinoconjunctivitis and migraine syndrome and medication overuse headaches. I've not seen her in his clinic since her initial evaluation of March 2017.  During the interval she has done very well and has eliminated her nasal issues and has eliminated her headaches. She's been consistently using montelukast and a nasal steroid and she has tapered off all her analgesic agents except for around the time of her menstrual period. She has tapered off caffeine. She has started a course immunotherapy and has not had any adverse effects secondary to this treatment. She did have some difficulty utilizing prolonged antibiotic administration and required Diflucan for an episode of candidiasis but for the most part she did not have much adverse effect from using her Augmentin and has now completed this medication.    Medication List           ADVIL 200 MG tablet  Generic drug:  ibuprofen  Take 800 mg by mouth every 8 (eight) hours as needed.     amoxicillin-clavulanate 875-125 MG tablet  Commonly known as:  AUGMENTIN  Take 1 tablet by mouth 2 (two) times daily.     azelastine 0.1 % nasal spray  Commonly known as:  ASTELIN  Can use two sprays in each nostril twice daily as needed for itchy nose and sneezing.     Bepotastine Besilate 1.5 % Soln  Commonly known as:  BEPREVE  Can use one drop in each eye twice a day for itchy eyes.     cetirizine 10 MG tablet  Commonly known as:  ZYRTEC  Take 20 mg by mouth daily.     EPINEPHrine 0.3 mg/0.3 mL Soaj injection  Commonly known as:  EPI-PEN  Use as directed for life-threatening  allergic reaction.     FISH OIL + D3 1000-1000 MG-UNIT Caps  Take by mouth.     FLAX SEEDS PO  Take by mouth.     fluconazole 150 MG tablet  Commonly known as:  DIFLUCAN  Take 1 tablet (150 mg total) by mouth once.     fluticasone 50 MCG/ACT nasal spray  Commonly known as:  FLONASE  Place 2 sprays into both nostrils daily.     HEATHER 0.35 MG tablet  Generic drug:  norethindrone  Take 1 tablet by mouth daily.     LORazepam 1 MG tablet  Commonly known as:  ATIVAN  Take 1 tablet (1 mg total) by mouth 2 (two) times daily as needed for anxiety.     montelukast 10 MG tablet  Commonly known as:  SINGULAIR  Take 1 tablet (10 mg total) by mouth daily.     venlafaxine XR 150 MG 24 hr capsule  Commonly known as:  EFFEXOR-XR  Take 150 mg by mouth daily with breakfast.     VITAMIN B 12 PO  Take by mouth daily.     VITAMIN B-6 PO  Take by mouth daily.        Past Medical History  Diagnosis Date  . 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 EXOGENOUS OBESITY 06/22/2010  . DEPRESSION 03/04/2008  . ALLERGIC RHINITIS 03/04/2008  . Prolactinoma ([place])   . Edema  pedal  . Anxiety     Past Surgical History  Procedure Laterality Date  . Breast surgery  2004    augmentation  . Liposuction    . Nasal sinus surgery    . Hammer toe surgery      Allergies  Allergen Reactions  . Percocet [Oxycodone-Acetaminophen] Other (See Comments)    Interfered with patient's antidepressants; made pt feel dizzy  . Septra [Sulfamethoxazole-Trimethoprim] Rash    Review of systems negative except as noted in HPI / PMHx or noted below:  Review of Systems  Constitutional: Negative.   HENT: Negative.   Eyes: Negative.   Respiratory: Negative.   Cardiovascular: Negative.   Gastrointestinal: Negative.   Genitourinary: Negative.   Musculoskeletal: Negative.   Skin: Negative.   Neurological: Negative.   Endo/Heme/Allergies: Negative.   Psychiatric/Behavioral: Negative.      Objective:    Filed Vitals:   03/30/16 1744  BP: 126/82  Pulse: 76  Resp: 20          Physical Exam  Constitutional: She is well-developed, well-nourished, and in no distress.  HENT:  Head: Normocephalic.  Right Ear: Tympanic membrane, external ear and ear canal normal.  Left Ear: Tympanic membrane, external ear and ear canal normal.  Nose: Nose normal. No mucosal edema or rhinorrhea.  Mouth/Throat: Uvula is midline, oropharynx is clear and moist and mucous membranes are normal. No oropharyngeal exudate.  Eyes: Conjunctivae are normal.  Neck: Trachea normal. No tracheal tenderness present. No tracheal deviation present. No thyromegaly present.  Cardiovascular: Normal rate, regular rhythm, S1 normal, S2 normal and normal heart sounds.   No murmur heard. Pulmonary/Chest: Breath sounds normal. No stridor. No respiratory distress. She has no wheezes. She has no rales.  Musculoskeletal: She exhibits no edema.  Lymphadenopathy:       Head (right side): No tonsillar adenopathy present.       Head (left side): No tonsillar adenopathy present.    She has no cervical adenopathy.  Neurological: She is alert. Gait normal.  Skin: No rash noted. She is not diaphoretic. No erythema. Nails show no clubbing.  Psychiatric: Mood and affect normal.    Diagnostics: None   Assessment and Plan:   1. Allergic rhinoconjunctivitis   2. Migraine syndrome     1. Continue to perform Allergen avoidance measures  2. Continue to remain off caffeine  3. Continue to remain off analgesic use as much as possible  4. Continue to Treat and prevent inflammation:   A. montelukast 10 mg one tablet one time per day  B. OTC Rhinocort or Flonase one spray each nostril one time per day.   5. If needed:   A. Astelin 2 sprays each nostril twice a day  B. Bepreve 1 drop each eye twice a day. Coupon  C. Cetirizine 1 tablet 1-2 times per day  6. Continue immunotherapy and EpiPen  7. Return to clinic in December 2017  or earlier if problem  8. Obtain fall flu vaccine  Marzetta Board is done quite well on her current medical therapy and I will continue to have her use anti-inflammatory agents in the form of a leukotriene modifier and nasal steroid on a regular basis at the same time that she undergoes a course of immunotherapy. I've encouraged her to remain off analgesics and caffeine as much as possible. We'll see her back in this clinic in approximately 6 months or earlier if there is a problem.  Allena Katz, MD [place]

## 2016-03-30 NOTE — Patient Instructions (Addendum)
  1. Continue to perform Allergen avoidance measures  2. Continue to remain off caffeine  3. Continue to remain off analgesic use as much as possible  4. Continue to Treat and prevent inflammation:   A. montelukast 10 mg one tablet one time per day  B. OTC Rhinocort or Flonase one spray each nostril one time per day.   5. If needed:   A. Astelin 2 sprays each nostril twice a day  B. Bepreve 1 drop each eye twice a day. Coupon  C. Cetirizine 1 tablet 1-2 times per day  6. Continue immunotherapy and EpiPen  7. Return to clinic in December 2017 or earlier if problem  8. Obtain fall flu vaccine

## 2016-04-12 ENCOUNTER — Other Ambulatory Visit: Payer: Self-pay

## 2016-04-12 ENCOUNTER — Telehealth: Payer: Self-pay

## 2016-04-12 ENCOUNTER — Ambulatory Visit (INDEPENDENT_AMBULATORY_CARE_PROVIDER_SITE_OTHER): Payer: Managed Care, Other (non HMO)

## 2016-04-12 DIAGNOSIS — J309 Allergic rhinitis, unspecified: Secondary | ICD-10-CM | POA: Diagnosis not present

## 2016-04-12 MED ORDER — AMOXICILLIN-POT CLAVULANATE 875-125 MG PO TABS: 1.0000 | ORAL_TABLET | Freq: Two times a day (BID) | ORAL | 0 refills | Status: DC

## 2016-04-12 NOTE — Telephone Encounter (Signed)
Spoke with pt and informed her of Augmentin and I sent in a 10 day supply.

## 2016-04-12 NOTE — Telephone Encounter (Signed)
Please have patient use Augmentin 875 one tablet twice a day for a full 10 days assuming that she has an infection.

## 2016-04-12 NOTE — Telephone Encounter (Signed)
Pt came in for her allergy injection and stated she has been having a greenish/brownish mucus she is coughing. She has had been having a cough with sinus pressure headache. Pt states she is doing her nose spray and antihistamines and is still having issues. Is there anything you can do for her?

## 2016-04-22 ENCOUNTER — Telehealth: Payer: Self-pay | Admitting: *Deleted

## 2016-04-22 ENCOUNTER — Ambulatory Visit (INDEPENDENT_AMBULATORY_CARE_PROVIDER_SITE_OTHER): Payer: Managed Care, Other (non HMO)

## 2016-04-22 DIAGNOSIS — J309 Allergic rhinitis, unspecified: Secondary | ICD-10-CM

## 2016-04-22 MED ORDER — FLUCONAZOLE 150 MG PO TABS: 150.0000 mg | ORAL_TABLET | Freq: Once | ORAL | 0 refills | Status: AC

## 2016-04-22 NOTE — Telephone Encounter (Signed)
Per Dr Neldon Mc call out diflucan

## 2016-04-22 NOTE — Telephone Encounter (Signed)
Pt is needed Diflucan sent in to the [facility] on [address].   Please Advise  Thanks

## 2016-04-22 NOTE — Telephone Encounter (Signed)
Patient had called and requested Diflucan, just finished Augmentin and has yeast infection.  Per Dr Neldon Mc ok to call out Diflucan take once and can repeat in one week

## 2016-04-26 ENCOUNTER — Ambulatory Visit (INDEPENDENT_AMBULATORY_CARE_PROVIDER_SITE_OTHER): Payer: Managed Care, Other (non HMO)

## 2016-04-26 DIAGNOSIS — J309 Allergic rhinitis, unspecified: Secondary | ICD-10-CM | POA: Diagnosis not present

## 2016-05-03 ENCOUNTER — Ambulatory Visit (INDEPENDENT_AMBULATORY_CARE_PROVIDER_SITE_OTHER): Payer: Managed Care, Other (non HMO)

## 2016-05-03 DIAGNOSIS — J309 Allergic rhinitis, unspecified: Secondary | ICD-10-CM | POA: Diagnosis not present

## 2016-05-10 ENCOUNTER — Ambulatory Visit (INDEPENDENT_AMBULATORY_CARE_PROVIDER_SITE_OTHER): Payer: Managed Care, Other (non HMO)

## 2016-05-10 DIAGNOSIS — J309 Allergic rhinitis, unspecified: Secondary | ICD-10-CM | POA: Diagnosis not present

## 2016-05-12 ENCOUNTER — Other Ambulatory Visit: Payer: Self-pay | Admitting: *Deleted

## 2016-05-12 MED ORDER — MONTELUKAST SODIUM 10 MG PO TABS: 10.0000 mg | ORAL_TABLET | Freq: Every day | ORAL | 3 refills | Status: DC

## 2016-05-21 ENCOUNTER — Ambulatory Visit (INDEPENDENT_AMBULATORY_CARE_PROVIDER_SITE_OTHER): Payer: Managed Care, Other (non HMO)

## 2016-05-21 DIAGNOSIS — J309 Allergic rhinitis, unspecified: Secondary | ICD-10-CM

## 2016-05-24 ENCOUNTER — Ambulatory Visit (INDEPENDENT_AMBULATORY_CARE_PROVIDER_SITE_OTHER): Payer: Managed Care, Other (non HMO) | Admitting: *Deleted

## 2016-05-24 DIAGNOSIS — J309 Allergic rhinitis, unspecified: Secondary | ICD-10-CM

## 2016-05-25 ENCOUNTER — Ambulatory Visit (INDEPENDENT_AMBULATORY_CARE_PROVIDER_SITE_OTHER): Payer: Managed Care, Other (non HMO) | Admitting: Internal Medicine

## 2016-05-25 ENCOUNTER — Encounter: Payer: Self-pay | Admitting: Internal Medicine

## 2016-05-25 VITALS — BP 110/76 | HR 80 | Temp 97.9°F | Resp 20 | Ht 61.5 in | Wt 172.0 lb

## 2016-05-25 DIAGNOSIS — D352 Benign neoplasm of pituitary gland: Secondary | ICD-10-CM

## 2016-05-25 DIAGNOSIS — J3089 Other allergic rhinitis: Secondary | ICD-10-CM | POA: Diagnosis not present

## 2016-05-25 DIAGNOSIS — D353 Benign neoplasm of craniopharyngeal duct: Secondary | ICD-10-CM

## 2016-05-25 NOTE — Progress Notes (Signed)
Subjective:    Patient ID: Chelsea Dyer, female    DOB: July 02, 1966, 50 y.o.   MRN: [id number]  HPI 50 year old patient who has a long history of a pituitary micro-adenoma and hyperprolactinemia.  She had been treated with bromocriptine in the past but has been off this medication for over one year.  Cost was prohibitive.  She was seen by OB/GYN recently and a prolactin level was 52.  It was recommended that she start a dopamine agonist. The patient had a brain MRI approximately, one year ago that suggested a tiny microadenoma.  Endocrine recommendations for treatment of microadenoma and hyperprolactinemia reviewed  The patient denies any headaches, visual disturbances or galactorrhea  Past Medical History:  Diagnosis Date  . ALLERGIC RHINITIS 03/04/2008  . Anxiety   . DEPRESSION 03/04/2008  . Edema    pedal  . EXOGENOUS OBESITY 06/22/2010  . 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 Prolactinoma [facility])      Social History   Social History  . Marital status: Married    Spouse name: N/A  . Number of children: N/A  . Years of education: N/A   Occupational History  . Not on file.   Social History Main Topics  . Smoking status: Never Smoker  . Smokeless tobacco: Never Used  . Alcohol use No  . Drug use: No  . Sexual activity: Not Currently   Other Topics Concern  . Not on file   Social History Narrative  . No narrative on file    Past Surgical History:  Procedure Laterality Date  . BREAST SURGERY  2004   augmentation  . HAMMER TOE SURGERY    . LIPOSUCTION    . NASAL SINUS SURGERY      Family History  Problem Relation Age of Onset  . Arthritis Mother     osteo  . Allergic rhinitis Mother     Allergies  Allergen Reactions  . Percocet [Oxycodone-Acetaminophen] Other (See Comments)    Interfered with patient's antidepressants; made pt feel dizzy  . Septra [Sulfamethoxazole-Trimethoprim] Rash    Current Outpatient Prescriptions on File Prior to Visit    Medication Sig Dispense Refill  . azelastine (ASTELIN) 0.1 % nasal spray Can use two sprays in each nostril twice daily as needed for itchy nose and sneezing. 30 mL 5  . Bepotastine Besilate (BEPREVE) 1.5 % SOLN Can use one drop in each eye twice a day for itchy eyes. 10 mL 5  . cetirizine (ZYRTEC) 10 MG tablet Take 20 mg by mouth daily.     . Cyanocobalamin (VITAMIN B 12 PO) Take by mouth daily.    . Fish Oil-Cholecalciferol (FISH OIL + D3) 1000-1000 MG-UNIT CAPS Take by mouth.    . Flaxseed, Linseed, (FLAX SEEDS PO) Take by mouth.    . fluticasone (FLONASE) 50 MCG/ACT nasal spray Place 2 sprays into both nostrils daily. 16 g 6  . HEATHER 0.35 MG tablet Take 1 tablet by mouth daily.     Marland Kitchen ibuprofen (ADVIL) 200 MG tablet Take 800 mg by mouth every 8 (eight) hours as needed.    Marland Kitchen LORazepam (ATIVAN) 1 MG tablet Take 1 tablet (1 mg total) by mouth 2 (two) times daily as needed for anxiety. 10 tablet 0  . montelukast (SINGULAIR) 10 MG tablet Take 1 tablet (10 mg total) by mouth daily. 30 tablet 3  . Pyridoxine HCl (VITAMIN B-6 PO) Take by mouth daily.    Marland Kitchen venlafaxine XR (EFFEXOR-XR) 150 MG 24 hr capsule Take 150  mg by mouth daily with breakfast.     No current facility-administered medications on file prior to visit.     BP 110/76 (BP Location: Left Arm, Patient Position: Sitting, Cuff Size: Normal)   Pulse 80   Temp 97.9 F (36.6 C) (Oral)   Resp 20   Ht 5' 1.5" (1.562 m)   Wt 172 lb (78 kg)   LMP 04/28/2016 (Exact Date)   SpO2 98%   BMI 31.97 kg/m    Review of Systems  Constitutional: Negative.   HENT: Negative for congestion, dental problem, hearing loss, rhinorrhea, sinus pressure, sore throat and tinnitus.   Eyes: Negative for pain, discharge and visual disturbance.  Respiratory: Negative for cough and shortness of breath.   Cardiovascular: Negative for chest pain, palpitations and leg swelling.  Gastrointestinal: Negative for abdominal distention, abdominal pain, blood in  stool, constipation, diarrhea, nausea and vomiting.  Genitourinary: Negative for difficulty urinating, dysuria, flank pain, frequency, hematuria, pelvic pain, urgency, vaginal bleeding, vaginal discharge and vaginal pain.  Musculoskeletal: Negative for arthralgias, gait problem and joint swelling.  Skin: Negative for rash.  Neurological: Negative for dizziness, syncope, speech difficulty, weakness, numbness and headaches.  Hematological: Negative for adenopathy.  Psychiatric/Behavioral: Negative for agitation, behavioral problems and dysphoric mood. The patient is not nervous/anxious.        Objective:   Physical Exam  Constitutional: She is oriented to person, place, and time. She appears well-developed and well-nourished. No distress.  Neurological: She is alert and oriented to person, place, and time.  Psychiatric: She has a normal mood and affect. Her behavior is normal.          Assessment & Plan:   Pituitary microadenoma with mild hyperprolactinemia. No indications for treatment at this time. The patient has had 2 conflicting recommendations for treatment of hyperprolactinemia and agree with OB/GYN that the patient should consider endocrine consultation for their recommendation.  Patient was told to report any new headaches or visual disturbances or galactorrhea.  Otherwise, would Repeat a prolactin level in one year and withhold treatment at this time  Nyoka Cowden

## 2016-05-25 NOTE — Patient Instructions (Signed)
Please report any new onset of headaches or any visual disturbances  Repeat prolactin level in one year  Consider endocrine evaluation

## 2016-05-25 NOTE — Progress Notes (Signed)
Pre visit review using our clinic review tool, if applicable. No additional management support is needed unless otherwise documented below in the visit note. 

## 2016-06-10 DIAGNOSIS — R52 Pain, unspecified: Secondary | ICD-10-CM

## 2016-06-14 ENCOUNTER — Ambulatory Visit (INDEPENDENT_AMBULATORY_CARE_PROVIDER_SITE_OTHER): Payer: Managed Care, Other (non HMO)

## 2016-06-14 DIAGNOSIS — J309 Allergic rhinitis, unspecified: Secondary | ICD-10-CM

## 2016-06-21 ENCOUNTER — Ambulatory Visit (INDEPENDENT_AMBULATORY_CARE_PROVIDER_SITE_OTHER): Payer: Managed Care, Other (non HMO) | Admitting: *Deleted

## 2016-06-21 DIAGNOSIS — J309 Allergic rhinitis, unspecified: Secondary | ICD-10-CM

## 2016-07-05 ENCOUNTER — Ambulatory Visit (INDEPENDENT_AMBULATORY_CARE_PROVIDER_SITE_OTHER): Payer: Managed Care, Other (non HMO) | Admitting: *Deleted

## 2016-07-05 DIAGNOSIS — J309 Allergic rhinitis, unspecified: Secondary | ICD-10-CM | POA: Diagnosis not present

## 2016-07-12 ENCOUNTER — Ambulatory Visit (INDEPENDENT_AMBULATORY_CARE_PROVIDER_SITE_OTHER): Payer: Managed Care, Other (non HMO)

## 2016-07-12 DIAGNOSIS — J309 Allergic rhinitis, unspecified: Secondary | ICD-10-CM

## 2016-07-19 ENCOUNTER — Ambulatory Visit (INDEPENDENT_AMBULATORY_CARE_PROVIDER_SITE_OTHER): Payer: Managed Care, Other (non HMO) | Admitting: *Deleted

## 2016-07-19 DIAGNOSIS — J309 Allergic rhinitis, unspecified: Secondary | ICD-10-CM | POA: Diagnosis not present

## 2016-07-27 ENCOUNTER — Other Ambulatory Visit: Payer: Self-pay | Admitting: Internal Medicine

## 2016-07-30 ENCOUNTER — Other Ambulatory Visit: Payer: Self-pay | Admitting: Internal Medicine

## 2016-08-02 ENCOUNTER — Ambulatory Visit (INDEPENDENT_AMBULATORY_CARE_PROVIDER_SITE_OTHER): Payer: Managed Care, Other (non HMO) | Admitting: *Deleted

## 2016-08-02 DIAGNOSIS — J309 Allergic rhinitis, unspecified: Secondary | ICD-10-CM | POA: Diagnosis not present

## 2016-08-18 ENCOUNTER — Ambulatory Visit (INDEPENDENT_AMBULATORY_CARE_PROVIDER_SITE_OTHER): Payer: Managed Care, Other (non HMO) | Admitting: *Deleted

## 2016-08-18 DIAGNOSIS — J309 Allergic rhinitis, unspecified: Secondary | ICD-10-CM | POA: Diagnosis not present

## 2016-08-23 ENCOUNTER — Telehealth: Payer: Self-pay | Admitting: Allergy and Immunology

## 2016-08-23 MED ORDER — PREDNISONE 10 MG PO TABS: 10.0000 mg | ORAL_TABLET | Freq: Every day | ORAL | 0 refills | Status: DC

## 2016-08-23 MED ORDER — AMOXICILLIN-POT CLAVULANATE 875-125 MG PO TABS: 1.0000 | ORAL_TABLET | Freq: Two times a day (BID) | ORAL | 0 refills | Status: DC

## 2016-08-23 NOTE — Telephone Encounter (Signed)
Please provide patient Augmentin 875 one tablet twice a day for the next 14 days and prednisone 10 mg tablet one tablet one time per day for the next 7 days.

## 2016-08-23 NOTE — Telephone Encounter (Signed)
Dr Neldon Mc advise

## 2016-08-23 NOTE — Telephone Encounter (Signed)
She is having drainage and her face, ears, eyes and nose are itching and has been having a sore throat. She has been taking the steroids she had left along with some amoxacillin that she had but she is out of them now. She felt like these were helping. She has also been using eye drops for the itchy eyes. Can something be called in for her? Please call her at [phone].

## 2016-08-23 NOTE — Telephone Encounter (Signed)
Augmentin and Prednisone sent to pharmacy. Made pt aware.

## 2016-08-31 ENCOUNTER — Telehealth: Payer: Self-pay | Admitting: Allergy and Immunology

## 2016-08-31 MED ORDER — PREDNISONE 10 MG PO TABS: 10.0000 mg | ORAL_TABLET | Freq: Every day | ORAL | 0 refills | Status: DC

## 2016-08-31 NOTE — Telephone Encounter (Signed)
Please inform patient that we can give her prednisone 10 mg a day for 7 more days but no prednisone thereafter.

## 2016-08-31 NOTE — Telephone Encounter (Signed)
Advised patient as written below sent prednisone to pharmacy

## 2016-08-31 NOTE — Telephone Encounter (Signed)
Pt called and said that she is in a lot pain and would like for you to sent her another round of prednisone for her. [phone].

## 2016-09-01 ENCOUNTER — Telehealth: Payer: Self-pay | Admitting: Allergy and Immunology

## 2016-09-01 ENCOUNTER — Encounter: Payer: Self-pay | Admitting: Allergy

## 2016-09-01 ENCOUNTER — Ambulatory Visit (INDEPENDENT_AMBULATORY_CARE_PROVIDER_SITE_OTHER): Payer: Managed Care, Other (non HMO) | Admitting: Allergy

## 2016-09-01 VITALS — BP 110/70 | HR 72 | Temp 98.6°F | Resp 16 | Wt 168.6 lb

## 2016-09-01 DIAGNOSIS — J0100 Acute maxillary sinusitis, unspecified: Secondary | ICD-10-CM

## 2016-09-01 DIAGNOSIS — M791 Myalgia, unspecified site: Secondary | ICD-10-CM

## 2016-09-01 MED ORDER — AMOXICILLIN-POT CLAVULANATE 875-125 MG PO TABS: 1.0000 | ORAL_TABLET | Freq: Two times a day (BID) | ORAL | 0 refills | Status: DC

## 2016-09-01 NOTE — Telephone Encounter (Signed)
Advised pt to make an appointment to be evaluated. Pt was scheduled a same-day appointment with Dr. Nelva Bush.

## 2016-09-01 NOTE — Progress Notes (Signed)
Follow-up Note  RE: Chelsea Dyer MRN: [id number] DOB: October 19, 1965 Date of Office Visit: 09/01/2016   History of present illness: Chelsea Dyer is a 50 y.o. female presenting today for sick visit. She was last seen in July 2017 by Dr. Neldon Mc for allergic rhinoconjunctivitis and migraine syndrome. She presents today as she has continued symptoms. She has been in contact with Dr. Neldon Mc via phone regarding her symptoms which are sinus pressure and pain over her frontal and maxillary sinuses, nasal congestion and drainage, body aches. She denies any fever. She states that her nasal drainage is now taking clear. The body aches have kept her up at night. She was prescribed Augmentin 875 mg to take twice a day for 14 days on the 08/23/16. She is still taking this course. She was also prescribed prednisone 10 mg daily for 7 days. She completed this course. She states she did feel better with this however since she has been off her symptoms have been worse. She called in yesterday and she was prescribed an additional prednisone 10 mg for 7 more days.  She is also taking Advil several times a day to help with her body aches.   She states she is still taking her routine medications of Azelastine nasal spray, Flonase nasal spray as well as her Zyrtec and Singulair.   Review of systems: Review of Systems  Constitutional: Positive for malaise/fatigue. Negative for chills, diaphoresis and fever.  HENT: Positive for ear pain and sinus pain. Negative for nosebleeds and sore throat.   Eyes: Negative for pain, discharge and redness.  Respiratory: Positive for cough. Negative for shortness of breath and wheezing.   Cardiovascular: Negative for chest pain.  Gastrointestinal: Positive for abdominal pain. Negative for nausea and vomiting.  Musculoskeletal: Positive for myalgias.  Skin: Negative for itching and rash.  Neurological: Positive for headaches.    All other systems negative unless noted above in  HPI  Past medical/social/surgical/family history have been reviewed and are unchanged unless specifically indicated below.  No changes  Medication List: Allergies as of 09/01/2016      Reactions   Percocet [oxycodone-acetaminophen] Other (See Comments)   Interfered with patient's antidepressants; made pt feel dizzy   Septra [sulfamethoxazole-trimethoprim] Rash      Medication List       Accurate as of 09/01/16  5:13 PM. Always use your most recent med list.          ADVIL 200 MG tablet Generic drug:  ibuprofen Take 800 mg by mouth every 8 (eight) hours as needed.   amoxicillin-clavulanate 875-125 MG tablet Commonly known as:  AUGMENTIN Take 1 tablet by mouth 2 (two) times daily.   azelastine 0.1 % nasal spray Commonly known as:  ASTELIN Can use two sprays in each nostril twice daily as needed for itchy nose and sneezing.   Bepotastine Besilate 1.5 % Soln Commonly known as:  BEPREVE Can use one drop in each eye twice a day for itchy eyes.   cetirizine 10 MG tablet Commonly known as:  ZYRTEC Take 20 mg by mouth daily.   FISH OIL + D3 1000-1000 MG-UNIT Caps Take by mouth.   FLAX SEEDS PO Take by mouth.   fluticasone 50 MCG/ACT nasal spray Commonly known as:  FLONASE Place 2 sprays into both nostrils daily.   HEATHER 0.35 MG tablet Generic drug:  norethindrone Take 1 tablet by mouth daily.   LORazepam 1 MG tablet Commonly known as:  ATIVAN Take 1 tablet (1 mg total)  by mouth 2 (two) times daily as needed for anxiety.   montelukast 10 MG tablet Commonly known as:  SINGULAIR Take 1 tablet (10 mg total) by mouth daily.   predniSONE 10 MG tablet Commonly known as:  DELTASONE Take 1 tablet (10 mg total) by mouth daily.   venlafaxine XR 150 MG 24 hr capsule Commonly known as:  EFFEXOR-XR TAKE 1 CAPSULE BY MOUTH DAILY   venlafaxine XR 75 MG 24 hr capsule Commonly known as:  EFFEXOR-XR TAKE 1 CAPSULE BY MOUTH ONCE DAILY   VITAMIN B 12 PO Take by mouth  daily.   VITAMIN B-6 PO Take by mouth daily.       Known medication allergies: Allergies  Allergen Reactions  . Percocet [Oxycodone-Acetaminophen] Other (See Comments)    Interfered with patient's antidepressants; made pt feel dizzy  . Septra [Sulfamethoxazole-Trimethoprim] Rash     Physical examination: Blood pressure 110/70, pulse 72, temperature 98.6 F (37 C), temperature source Oral, resp. rate 16, weight 168 lb 9.6 oz (76.5 kg).  General: Patient is tearful and is lying on the exam table without the lights on  HEENT: TMs pearly gray, turbinates moderately edematous with clear discharge, post-pharynx non erythematous. Neck: Supple without lymphadenopathy. Lungs: Clear to auscultation without wheezing, rhonchi or rales. {no increased work of breathing. CV: Normal S1, S2 without murmurs. Abdomen: Nondistended, nontender. Skin: Warm and dry, without lesions or rashes. Extremities:  No clubbing, cyanosis or edema. Neuro:   Grossly intact.  Diagnositics/Labs: None today  Assessment and plan:    Acute sinus infection   - She has been prescribed a 14 day Augmentin course however she is near the end of this course and is still complaining of significant sinus pressure and pain. We'll extend her course to complete a full 21 days of antibiotics.    - She will continue the prednisone 10 mg some 7 days that has previously been prescribed for her   - For sleep recommend that she use Benadryl 25 mg at bedtime    - Advised that she start nasal saline rinses followed by her fluticasone 2 sprays each nostril daily and azelastine 2 sprays twice a day.  She will continue her regular routine of Zyrtec and Singulair daily.  Myalgias    - somewhat concerned that she could have developed flu in addition to above sinusitis that could be leading to her body aches and pain.  As myalgias is not commonly seen with acute sinus infections.  She is outside of the 72 hour window for possible  treatment of influenza  - Advised that she alternate between Advil 400 mg and Tylenol 500 mg every 6 hours as she needs them.  - For sleep recommended Benadryl as above  - Advised she continue supportive care with maintaining hydration and getting adequate rest    Follow-up 4-6 weeks  I appreciate the opportunity to take part in Chelsea Dyer's care. Please do not hesitate to contact me with questions.  Sincerely,   Prudy Feeler, MD Allergy/Immunology [facility]

## 2016-09-01 NOTE — Patient Instructions (Signed)
Will extend her antibiotic course for full 21 days as has not improved much with current course.   Finish the Augmentin you are taking now and then take additional 7 days.   Continue prednisone that you received yesterday for the next week.   Alternate between Advil and tylenol for pain control.  Take Advil 400mg  every 6 hours.  Take Tylenol 500mg  every 6 hours.   You will take something every 3 hours as needed.   May use Benadryl 25mg  to help with sleep.    Drink plenty of water, soups to maintain hydration.    Get plenty of rest.    Follow-up 4-6 weeks

## 2016-09-01 NOTE — Telephone Encounter (Signed)
She has started the steroids that was prescribed but now has developed a dry cough. Can something be called in for the cough? She wants to know if there is something that she should take rather than the Advil that she is using for her headache that would be easier on her kidneys? Please give her a call back at [phone]. She says if she needs to come in for a visit she will. She uses [facility] on [address].

## 2016-09-16 ENCOUNTER — Other Ambulatory Visit: Payer: Self-pay | Admitting: *Deleted

## 2016-09-16 MED ORDER — MONTELUKAST SODIUM 10 MG PO TABS: 10.0000 mg | ORAL_TABLET | Freq: Every day | ORAL | 0 refills | Status: DC

## 2016-09-20 ENCOUNTER — Ambulatory Visit (INDEPENDENT_AMBULATORY_CARE_PROVIDER_SITE_OTHER): Payer: No Typology Code available for payment source | Admitting: *Deleted

## 2016-09-20 DIAGNOSIS — J309 Allergic rhinitis, unspecified: Secondary | ICD-10-CM | POA: Diagnosis not present

## 2016-10-04 ENCOUNTER — Ambulatory Visit (INDEPENDENT_AMBULATORY_CARE_PROVIDER_SITE_OTHER): Payer: No Typology Code available for payment source

## 2016-10-04 DIAGNOSIS — J309 Allergic rhinitis, unspecified: Secondary | ICD-10-CM

## 2016-10-07 DIAGNOSIS — J301 Allergic rhinitis due to pollen: Secondary | ICD-10-CM

## 2016-10-08 DIAGNOSIS — J3089 Other allergic rhinitis: Secondary | ICD-10-CM

## 2016-10-11 ENCOUNTER — Telehealth: Payer: Self-pay | Admitting: Internal Medicine

## 2016-10-11 MED ORDER — LORAZEPAM 1 MG PO TABS: 1.0000 mg | ORAL_TABLET | Freq: Two times a day (BID) | ORAL | 0 refills | Status: DC | PRN

## 2016-10-11 NOTE — Telephone Encounter (Signed)
Refill called in for lorazepam to [facility] @ [place]

## 2016-10-11 NOTE — Telephone Encounter (Signed)
Pt need a refill on lorazepam [facility] [place]

## 2016-10-14 ENCOUNTER — Ambulatory Visit (INDEPENDENT_AMBULATORY_CARE_PROVIDER_SITE_OTHER): Payer: No Typology Code available for payment source

## 2016-10-14 DIAGNOSIS — J309 Allergic rhinitis, unspecified: Secondary | ICD-10-CM

## 2016-10-21 ENCOUNTER — Ambulatory Visit (INDEPENDENT_AMBULATORY_CARE_PROVIDER_SITE_OTHER): Payer: No Typology Code available for payment source | Admitting: *Deleted

## 2016-10-21 ENCOUNTER — Other Ambulatory Visit: Payer: Self-pay | Admitting: *Deleted

## 2016-10-21 DIAGNOSIS — J309 Allergic rhinitis, unspecified: Secondary | ICD-10-CM | POA: Diagnosis not present

## 2016-10-21 MED ORDER — MONTELUKAST SODIUM 10 MG PO TABS: 10.0000 mg | ORAL_TABLET | Freq: Every day | ORAL | 0 refills | Status: DC

## 2016-10-28 ENCOUNTER — Ambulatory Visit (INDEPENDENT_AMBULATORY_CARE_PROVIDER_SITE_OTHER): Payer: No Typology Code available for payment source | Admitting: *Deleted

## 2016-10-28 DIAGNOSIS — J309 Allergic rhinitis, unspecified: Secondary | ICD-10-CM

## 2016-11-04 ENCOUNTER — Ambulatory Visit (INDEPENDENT_AMBULATORY_CARE_PROVIDER_SITE_OTHER): Payer: No Typology Code available for payment source

## 2016-11-04 DIAGNOSIS — J309 Allergic rhinitis, unspecified: Secondary | ICD-10-CM | POA: Diagnosis not present

## 2016-11-18 ENCOUNTER — Ambulatory Visit (INDEPENDENT_AMBULATORY_CARE_PROVIDER_SITE_OTHER): Payer: No Typology Code available for payment source | Admitting: *Deleted

## 2016-11-18 DIAGNOSIS — J309 Allergic rhinitis, unspecified: Secondary | ICD-10-CM

## 2016-11-19 ENCOUNTER — Other Ambulatory Visit: Payer: Self-pay | Admitting: Allergy and Immunology

## 2016-11-25 ENCOUNTER — Ambulatory Visit (INDEPENDENT_AMBULATORY_CARE_PROVIDER_SITE_OTHER): Payer: No Typology Code available for payment source | Admitting: *Deleted

## 2016-11-25 DIAGNOSIS — J309 Allergic rhinitis, unspecified: Secondary | ICD-10-CM

## 2016-12-02 ENCOUNTER — Ambulatory Visit (INDEPENDENT_AMBULATORY_CARE_PROVIDER_SITE_OTHER): Payer: No Typology Code available for payment source | Admitting: *Deleted

## 2016-12-02 DIAGNOSIS — J309 Allergic rhinitis, unspecified: Secondary | ICD-10-CM | POA: Diagnosis not present

## 2016-12-09 ENCOUNTER — Ambulatory Visit (INDEPENDENT_AMBULATORY_CARE_PROVIDER_SITE_OTHER): Payer: Self-pay | Admitting: *Deleted

## 2016-12-09 DIAGNOSIS — J309 Allergic rhinitis, unspecified: Secondary | ICD-10-CM

## 2016-12-16 ENCOUNTER — Ambulatory Visit (INDEPENDENT_AMBULATORY_CARE_PROVIDER_SITE_OTHER): Payer: Self-pay | Admitting: *Deleted

## 2016-12-16 DIAGNOSIS — J309 Allergic rhinitis, unspecified: Secondary | ICD-10-CM

## 2016-12-20 ENCOUNTER — Other Ambulatory Visit: Payer: Self-pay | Admitting: Internal Medicine

## 2016-12-21 ENCOUNTER — Telehealth: Payer: Self-pay | Admitting: Internal Medicine

## 2016-12-21 ENCOUNTER — Other Ambulatory Visit: Payer: Self-pay | Admitting: Internal Medicine

## 2016-12-21 NOTE — Telephone Encounter (Signed)
° ° ° °  Pt request refill of the following:  LORazepam (ATIVAN) 1 MG tablet   Phamacy:  [facility]

## 2016-12-23 ENCOUNTER — Other Ambulatory Visit: Payer: Self-pay | Admitting: Internal Medicine

## 2016-12-23 ENCOUNTER — Ambulatory Visit (INDEPENDENT_AMBULATORY_CARE_PROVIDER_SITE_OTHER): Payer: Self-pay | Admitting: *Deleted

## 2016-12-23 DIAGNOSIS — J309 Allergic rhinitis, unspecified: Secondary | ICD-10-CM

## 2016-12-23 MED ORDER — LORAZEPAM 1 MG PO TABS: 1.0000 mg | ORAL_TABLET | Freq: Two times a day (BID) | ORAL | 0 refills | Status: DC | PRN

## 2016-12-23 NOTE — Telephone Encounter (Signed)
Medication refilled

## 2016-12-30 ENCOUNTER — Ambulatory Visit (INDEPENDENT_AMBULATORY_CARE_PROVIDER_SITE_OTHER): Payer: Self-pay

## 2016-12-30 DIAGNOSIS — J309 Allergic rhinitis, unspecified: Secondary | ICD-10-CM

## 2017-01-05 ENCOUNTER — Telehealth: Payer: Self-pay | Admitting: Allergy and Immunology

## 2017-01-05 NOTE — Telephone Encounter (Signed)
patient has been experiencing headaches for most of last month - is there anything she can do for them?

## 2017-01-05 NOTE — Telephone Encounter (Signed)
Please advise 

## 2017-01-05 NOTE — Telephone Encounter (Signed)
Patient has been scheduled for 01/18/17 in [place] with Dr. Neldon Mc.

## 2017-01-05 NOTE — Telephone Encounter (Signed)
Last visit July 2017. Needs OV

## 2017-01-06 ENCOUNTER — Encounter: Payer: Self-pay | Admitting: Family Medicine

## 2017-01-06 ENCOUNTER — Telehealth: Payer: Self-pay | Admitting: Internal Medicine

## 2017-01-06 ENCOUNTER — Other Ambulatory Visit: Payer: Self-pay

## 2017-01-06 ENCOUNTER — Ambulatory Visit (INDEPENDENT_AMBULATORY_CARE_PROVIDER_SITE_OTHER): Payer: PRIVATE HEALTH INSURANCE | Admitting: Family Medicine

## 2017-01-06 ENCOUNTER — Ambulatory Visit (INDEPENDENT_AMBULATORY_CARE_PROVIDER_SITE_OTHER): Payer: Self-pay | Admitting: *Deleted

## 2017-01-06 VITALS — BP 124/82 | HR 78 | Temp 98.5°F | Wt 167.8 lb

## 2017-01-06 DIAGNOSIS — J309 Allergic rhinitis, unspecified: Secondary | ICD-10-CM

## 2017-01-06 DIAGNOSIS — R51 Headache: Secondary | ICD-10-CM | POA: Diagnosis not present

## 2017-01-06 DIAGNOSIS — G43809 Other migraine, not intractable, without status migrainosus: Secondary | ICD-10-CM | POA: Diagnosis not present

## 2017-01-06 DIAGNOSIS — R519 Headache, unspecified: Secondary | ICD-10-CM

## 2017-01-06 MED ORDER — KETOROLAC TROMETHAMINE 60 MG/2ML IM SOLN
30.0000 mg | Freq: Once | INTRAMUSCULAR | Status: AC
  Administered 2017-01-06: 30 mg via INTRAMUSCULAR

## 2017-01-06 MED ORDER — KETOROLAC TROMETHAMINE 30 MG/ML IM SOLN: 30.0000 mg | Freq: Once | INTRAMUSCULAR | 0 refills | Status: DC

## 2017-01-06 MED ORDER — SUMATRIPTAN SUCCINATE 50 MG PO TABS: ORAL_TABLET | ORAL | 0 refills | Status: DC

## 2017-01-06 NOTE — Progress Notes (Signed)
Pre visit review using our clinic review tool, if applicable. No additional management support is needed unless otherwise documented below in the visit note. 

## 2017-01-06 NOTE — Progress Notes (Addendum)
Subjective:    Patient ID: Chelsea Dyer, female    DOB: 1966-03-28, 51 y.o.   MRN: [id number]  HPI  Chelsea Dyer is a 51 year old female who presents today with a headache that has been going on for 2 months. This headache has been on and off however patient reports that this has been occurring most days and can be better in the morning. She reports that laying her head down can be a trigger with pain noted in frontal area and headaches started after initiation of orthodontics. She has experienced associated nausea, photophobia, and phonophobia. She has no personal or family history of migraines.  Pain is described as an 8 and noted as throbbing.  She reports feeling well this morning and she then notes that she will experience dizziness and nausea but no vomiting. No dizziness, visual changes, vomiting, fever, chills, sweats, weakness, numbness, or tingling reported today. She denies this as the "worst headache" she has ever had and denies thunderclap HA. Treatment at home with advil BID has provided moderate benefit.  She denies taking medication today and reports taking acetaminophen yesterday that provided moderate benefit but headache returned last night..  She has also seen a chiropractor and massage therapist which has provided benefit as she stated that her "muscles are tight" She sees an allergist and has a history of headaches when allergies flare; today she denies sinus pressure/pain, nasal congestion, itchy/watery eyes today  History of pituitary microadenoma and hyperprolactinemia where she has been treated with bromocriptine previously. She is no longer receiving this treatment due to cost. She was advised to consider endocrine consultation and report any new headaches, visual disturbances, or galactorrhea.   Review of Systems  Constitutional: Negative for chills and fever.  HENT: Negative for congestion, postnasal drip, rhinorrhea, sinus pain, sinus pressure, sneezing and sore throat.     Eyes: Positive for photophobia. Negative for itching and visual disturbance.  Respiratory: Negative for cough, shortness of breath and wheezing.   Cardiovascular: Negative for chest pain and palpitations.  Gastrointestinal: Negative for abdominal pain, constipation, diarrhea, nausea and vomiting.  Skin: Negative for rash.  Neurological: Positive for headaches. Negative for dizziness, weakness, light-headedness and numbness.  Psychiatric/Behavioral:       Crying during encounter stating the reason is for stress and not pain       Objective:   Physical Exam  Constitutional: She appears well-developed and well-nourished.  Crying during exam; reports increased personal stress  Eyes: Pupils are equal, round, and reactive to light. No scleral icterus.  Neck: Neck supple.  Cardiovascular: Normal rate and regular rhythm.   Pulmonary/Chest: Effort normal and breath sounds normal. She has no wheezes. She has no rales.  Abdominal: Soft. Bowel sounds are normal. There is no tenderness.  Lymphadenopathy:    She has no cervical adenopathy.  Neurological:  II-Visual fields grossly intact. III/IV/VI-Extraocular movements intact. Pupils reactive bilaterally. V/VII-Smile symmetric, equal eyebrow raise, facial sensation intact VIII- Hearing grossly intact XI-bilateral shoulder shrug XII-midline tongue extension Motor: 5/5 bilaterally with normal tone and bulk Cerebellar: Normal finger-to-nose and normal heel-to-shin test.  Romberg negative Ambulates with a coordinated gait  Skin: Skin is warm and dry. No rash noted.  Psychiatric:  Denies depressed/anxious mood today. Denies suicidal or homicidal ideation; reports increased stress which is causing her to cry        Assessment & Plan:  1. Other migraine without status migrainosus, not intractable Neuro exam is normal, exam is reassuring, trigger of headache noted  with initiation of orthodontics and increased stress. Treated with ketorolac,  and high flow oxygen for 15 minutes; sumatriptan provided. She reported improvement with this treatment. Advised patient on warning signs that require immediate medical attention and advised follow up with PCP for physical and effectiveness of medication. We discussed that if symptoms do not improve or she develops new symptoms, imaging can be considered. - ketorolac (TORADOL) 30 MG/ML injection; Inject 1 mL (30 mg total) into the muscle once.  Dispense: 1 mL; Refill: 0 - SUMAtriptan (IMITREX) 50 MG tablet; Take one tablet by mouth as needed for migraine. May repeat in 2 hours if headache persists or recurs.  Dispense: 10 tablet; Refill: 0  Delano Metz, FNP-C

## 2017-01-06 NOTE — Patient Instructions (Addendum)
Please take medication as directed. If symptoms do not improve with treatment, worsen, or you develop worsening headache, please seek medical treatment. Also, please follow up with your provider for a physical and evaluation of medication effectiveness.   Migraine Headache A migraine headache is a very strong throbbing pain on one side or both sides of your head. Migraines can also cause other symptoms. Talk with your doctor about what things may bring on (trigger) your migraine headaches. Follow these instructions at home: Medicines   Take over-the-counter and prescription medicines only as told by your doctor.  Do not drive or use heavy machinery while taking prescription pain medicine.  To prevent or treat constipation while you are taking prescription pain medicine, your doctor may recommend that you:  Drink enough fluid to keep your pee (urine) clear or pale yellow.  Take over-the-counter or prescription medicines.  Eat foods that are high in fiber. These include fresh fruits and vegetables, whole grains, and beans.  Limit foods that are high in fat and processed sugars. These include fried and sweet foods. Lifestyle   Avoid alcohol.  Do not use any products that contain nicotine or tobacco, such as cigarettes and e-cigarettes. If you need help quitting, ask your doctor.  Get at least 8 hours of sleep every night.  Limit your stress. General instructions    Keep a journal to find out what may bring on your migraines. For example, write down:  What you eat and drink.  How much sleep you get.  Any change in what you eat or drink.  Any change in your medicines.  If you have a migraine:  Avoid things that make your symptoms worse, such as bright lights.  It may help to lie down in a dark, quiet room.  Do not drive or use heavy machinery.  Ask your doctor what activities are safe for you.  Keep all follow-up visits as told by your doctor. This is  important. Contact a doctor if:  You get a migraine that is different or worse than your usual migraines. Get help right away if:  Your migraine gets very bad.  You have a fever.  You have a stiff neck.  You have trouble seeing.  Your muscles feel weak or like you cannot control them.  You start to lose your balance a lot.  You start to have trouble walking.  You pass out (faint). This information is not intended to replace advice given to you by your health care provider. Make sure you discuss any questions you have with your health care provider. Document Released: 06/08/2008 Document Revised: 03/19/2016 Document Reviewed: 02/16/2016 Elsevier Interactive Patient Education  2017 [facility] FAST TRACK!!!  SAME DAY Appointments for ACUTE CARE  Such as: Sprains, Injuries, cuts, abrasions, rashes, muscle pain, joint pain, back pain Colds, flu, sore throats, headache, allergies, cough, fever  Ear pain, sinus and eye infections Abdominal pain, nausea, vomiting, diarrhea, upset stomach Animal/insect bites  3 Easy Ways to Schedule: Walk-In Scheduling Call in scheduling Mychart Sign-up: [email or web address]

## 2017-01-06 NOTE — Telephone Encounter (Signed)
Pharmacy was called to clarify the order.

## 2017-01-06 NOTE — Telephone Encounter (Signed)
[facility] with [facility] called to question the Rx  ketorolac (TORADOL) 30 MG/ML injection  Pharmacist would like confirmation because this is not something normally prescribed outside of the office.  They do not carry and will need to order. Pt states she received this injection in the office.  Please call pharmacy to advise. Thanks!

## 2017-01-07 ENCOUNTER — Other Ambulatory Visit: Payer: Self-pay | Admitting: Family Medicine

## 2017-01-07 DIAGNOSIS — R51 Headache: Principal | ICD-10-CM

## 2017-01-07 DIAGNOSIS — R519 Headache, unspecified: Secondary | ICD-10-CM

## 2017-01-07 LAB — BASIC METABOLIC PANEL
BUN: 12 mg/dL (ref 6–23)
CHLORIDE: 107 meq/L (ref 96–112)
CO2: 28 meq/L (ref 19–32)
Calcium: 9.6 mg/dL (ref 8.4–10.5)
Creatinine, Ser: 0.58 mg/dL (ref 0.40–1.20)
GFR: 116.67 mL/min (ref >60.00)
GLUCOSE: 95 mg/dL (ref 70–99)
POTASSIUM: 4.7 meq/L (ref 3.5–5.1)
Sodium: 141 mEq/L (ref 135–145)

## 2017-01-07 LAB — POCT URINE PREGNANCY: Preg Test, Ur: NEGATIVE

## 2017-01-07 NOTE — Progress Notes (Signed)
Noted  

## 2017-01-07 NOTE — Addendum Note (Signed)
Addended by: Wyvonne Lenz on: 01/07/2017 10:39 AM   Modules accepted: Orders

## 2017-01-07 NOTE — Addendum Note (Signed)
Addended by: Tomi Likens on: 01/07/2017 11:12 AM   Modules accepted: Orders

## 2017-01-07 NOTE — Progress Notes (Signed)
MRI of Brain will be ordered to evaluate headaches due to history of pituitary microadenoma.  Contacted patient and she will obtain lab work for kidney function today and also urine pregnancy while she is not likely pregnant, she is still having periods.

## 2017-01-13 ENCOUNTER — Ambulatory Visit (INDEPENDENT_AMBULATORY_CARE_PROVIDER_SITE_OTHER): Payer: Self-pay | Admitting: *Deleted

## 2017-01-13 DIAGNOSIS — J309 Allergic rhinitis, unspecified: Secondary | ICD-10-CM

## 2017-01-17 ENCOUNTER — Other Ambulatory Visit: Payer: Self-pay | Admitting: Family Medicine

## 2017-01-17 DIAGNOSIS — G43809 Other migraine, not intractable, without status migrainosus: Secondary | ICD-10-CM

## 2017-01-17 NOTE — Telephone Encounter (Signed)
Treatment with this medication should be no more than 9 episodes in one month. This was prescribed one week ago which indicates that treatment has been 5 times in the past week. MRI has been scheduled. Advise use of ibuprofen for headache and this should be no more than 15 times/month to avoid overuse headache. If headache is not improving advise follow up for further evaluation.

## 2017-01-18 ENCOUNTER — Ambulatory Visit: Payer: Self-pay | Admitting: Allergy and Immunology

## 2017-01-18 NOTE — Telephone Encounter (Signed)
Spoke with patient stated that she still has headaches and needed medication, patient scheduled to see Gregary Signs on 01/24/2017 at 10 am.

## 2017-01-18 NOTE — Telephone Encounter (Signed)
Called and left message for patient to return my call in the office.

## 2017-01-22 ENCOUNTER — Other Ambulatory Visit: Payer: Self-pay | Admitting: Family Medicine

## 2017-01-22 ENCOUNTER — Ambulatory Visit
Admission: RE | Admit: 2017-01-22 | Discharge: 2017-01-22 | Disposition: A | Discharge status: Home / Self Care | Payer: PRIVATE HEALTH INSURANCE | Source: Ambulatory Visit | Attending: Family Medicine | Admitting: Family Medicine

## 2017-01-22 DIAGNOSIS — R519 Headache, unspecified: Secondary | ICD-10-CM

## 2017-01-22 DIAGNOSIS — R51 Headache: Principal | ICD-10-CM

## 2017-01-24 ENCOUNTER — Encounter: Payer: Self-pay | Admitting: Family Medicine

## 2017-01-24 ENCOUNTER — Ambulatory Visit (INDEPENDENT_AMBULATORY_CARE_PROVIDER_SITE_OTHER): Payer: PRIVATE HEALTH INSURANCE | Admitting: Family Medicine

## 2017-01-24 VITALS — BP 128/90 | HR 74 | Temp 97.3°F | Wt 168.4 lb

## 2017-01-24 DIAGNOSIS — G44209 Tension-type headache, unspecified, not intractable: Secondary | ICD-10-CM | POA: Diagnosis not present

## 2017-01-24 MED ORDER — NAPROXEN 500 MG PO TABS: 500.0000 mg | ORAL_TABLET | Freq: Two times a day (BID) | ORAL | 0 refills | Status: DC

## 2017-01-24 NOTE — Progress Notes (Signed)
Subjective:    Patient ID: Chelsea Dyer, female    DOB: 19-May-1966, 51 y.o.   MRN: [id number]  HPI  Chelsea Dyer is here today for evaluation of her migraine headaches that have been present for over 2 months and occurrence of headache today.  She was evaluated and treated with toradol, and provided sumatriptan for treatment. She states sumatriptan only provided benefit one time and she treated with this medication for 5 days. Today, she reports a dull headache today as she reports stopping diet cokes.  She reports drinking 6 diet cokes/day prior but stopping suddenly.  Pain is rated today as a 3 and reported as dull. She reports that she feels tension in her neck that triggers this pain. She also wears braces and she has pain after adjustment.  She reports pain is located in frontal area. She denies N/V, photophobia or phonophobia.  No dizziness, sinus pressure/pain, rhinitis, post nasal drip, sore throat, visual changes, vomiting, fever, chills, sweats, weakness, numbness, or tingling reported today.   Advil and acetaminophen have provided moderate benefit. Treatment with a TENS unit by her chiropractor has provided excellent benefit. She is seeing her chiropractor twice weekly.  She has a history of pituitary microadenoma and hyperprolactinemia where she has been treated with bromocriptine previously.  She is no longer receiving this treatment due to cost.  She has been advised to consider endocrine consultation and report any new headaches, visual disturbances, or galactorrhea.   Review of Systems  Constitutional: Negative for chills, fatigue and fever.  HENT: Negative for congestion, postnasal drip, rhinorrhea, sinus pain, sinus pressure, sneezing and sore throat.   Eyes: Negative for photophobia and visual disturbance.  Respiratory: Negative for cough, shortness of breath and wheezing.   Cardiovascular: Negative for chest pain and palpitations.  Gastrointestinal: Negative for nausea and  vomiting.  Musculoskeletal: Negative for arthralgias and myalgias.  Neurological: Positive for headaches. Negative for dizziness, weakness and light-headedness.   Past Medical History:  Diagnosis Date  . ALLERGIC RHINITIS 03/04/2008  . Anxiety   . DEPRESSION 03/04/2008  . Edema    pedal  . EXOGENOUS OBESITY 06/22/2010  . 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 Prolactinoma [facility])      Social History   Social History  . Marital status: Married    Spouse name: N/A  . Number of children: N/A  . Years of education: N/A   Occupational History  . Not on file.   Social History Main Topics  . Smoking status: Never Smoker  . Smokeless tobacco: Never Used  . Alcohol use No  . Drug use: No  . Sexual activity: Not Currently   Other Topics Concern  . Not on file   Social History Narrative  . No narrative on file    Past Surgical History:  Procedure Laterality Date  . BREAST SURGERY  2004   augmentation  . HAMMER TOE SURGERY    . LIPOSUCTION    . NASAL SINUS SURGERY      Family History  Problem Relation Age of Onset  . Arthritis Mother        osteo  . Allergic rhinitis Mother     Allergies  Allergen Reactions  . Percocet [Oxycodone-Acetaminophen] Other (See Comments)    Interfered with patient's antidepressants; made pt feel dizzy  . Septra [Sulfamethoxazole-Trimethoprim] Rash    Current Outpatient Prescriptions on File Prior to Visit  Medication Sig Dispense Refill  . azelastine (ASTELIN) 0.1 % nasal spray Can use two sprays in each  nostril twice daily as needed for itchy nose and sneezing. 30 mL 5  . Bepotastine Besilate (BEPREVE) 1.5 % SOLN Can use one drop in each eye twice a day for itchy eyes. (Patient taking differently: Can use one drop in each eye twice a day for itchy eyes. PRN) 10 mL 5  . cetirizine (ZYRTEC) 10 MG tablet Take 20 mg by mouth daily.     . Cyanocobalamin (VITAMIN B 12 PO) Take by mouth daily.    . Fish Oil-Cholecalciferol (FISH OIL + D3)  1000-1000 MG-UNIT CAPS Take by mouth.    . fluticasone (FLONASE) 50 MCG/ACT nasal spray Place 2 sprays into both nostrils daily. (Patient taking differently: Place 2 sprays into both nostrils daily. States using prn) 16 g 6  . ibuprofen (ADVIL) 200 MG tablet Take 800 mg by mouth every 8 (eight) hours as needed.    Marland Kitchen LORazepam (ATIVAN) 1 MG tablet Take 1 tablet (1 mg total) by mouth 2 (two) times daily as needed for anxiety. 30 tablet 0  . montelukast (SINGULAIR) 10 MG tablet TAKE ONE TABLET BY MOUTH DAILY 30 tablet 2  . Pyridoxine HCl (VITAMIN B-6 PO) Take by mouth daily.    . SUMAtriptan (IMITREX) 50 MG tablet Take one tablet by mouth as needed for migraine. May repeat in 2 hours if headache persists or recurs. 10 tablet 0  . venlafaxine XR (EFFEXOR-XR) 150 MG 24 hr capsule TAKE 1 CAPSULE BY MOUTH DAILY 30 capsule 3  . venlafaxine XR (EFFEXOR-XR) 75 MG 24 hr capsule TAKE 1 CAPSULE BY MOUTH ONCE DAILY 30 capsule 3   No current facility-administered medications on file prior to visit.     BP 128/90 (BP Location: Left Arm, Patient Position: Sitting, Cuff Size: Normal)   Pulse 74   Temp 97.3 F (36.3 C) (Oral)   Wt 168 lb 6.4 oz (76.4 kg)   SpO2 98%   BMI 31.30 kg/m       Objective:   Physical Exam  Constitutional: She is oriented to person, place, and time. She appears well-developed and well-nourished.  Eyes: Pupils are equal, round, and reactive to light. No scleral icterus.  Neck: Neck supple.  Cardiovascular: Normal rate, regular rhythm and intact distal pulses.   Pulmonary/Chest: Effort normal and breath sounds normal. She has no wheezes. She has no rales.  Lymphadenopathy:    She has no cervical adenopathy.  Neurological: She is alert and oriented to person, place, and time. Coordination normal.  II-Visual fields grossly intact. III/IV/VI-Extraocular movements intact. Pupils reactive bilaterally. V/VII-Smile symmetric, equal eyebrow raise, facial sensation intact VIII-  Hearing grossly intact XI-bilateral shoulder shrug XII-midline tongue extension Motor: 5/5 bilaterally with normal tone and bulk Romberg negative Ambulates with a coordinated gait   Skin: Skin is warm and dry. No rash noted.        Assessment & Plan:  1. Tension-type headache, not intractable, unspecified chronicity pattern Headaches have improved; relaxation with TENS unit has provided excellent benefit; history of abruptly stopping long term use of diet cokes and initiation of orthodontics are suspicious for a trigger for headaches; will treat with naproxen as needed and obtain MRI due to history of pituitary microadenoma and history of headaches for over 2 months. Neuro exam is normal which is reassuring today.  - naproxen (NAPROSYN) 500 MG tablet; Take 1 tablet (500 mg total) by mouth 2 (two) times daily with a meal.  Dispense: 30 tablet; Refill: 0  We discussed that headaches are likely tension type  due to history and improvement with TENS unit. We also discussed triggers of orthodontic adjustment and concern for treatment of overuse headaches. She will discontinue advil and initiate naproxen BID as needed as a bridge to decrease treatment for headaches long term. MRI will be obtained due to history. Return precautions advised.  Delano Metz, FNP-C

## 2017-01-24 NOTE — Patient Instructions (Signed)
It was a pleasure to see you today. Please take medication with food as directed for headache if needed. Proceed with MRI and results will determine further treatment. Please continue therapy with your chiropractor as you have also experienced benefits.   Tension Headache A tension headache is a feeling of pain, pressure, or aching that is often felt over the front and sides of the head. The pain can be dull, or it can feel tight (constricting). Tension headaches are not normally associated with nausea or vomiting, and they do not get worse with physical activity. Tension headaches can last from 30 minutes to several days. This is the most common type of headache. CAUSES The exact cause of this condition is not known. Tension headaches often begin after stress, anxiety, or depression. Other triggers may include:  Alcohol.  Too much caffeine, or caffeine withdrawal.  Respiratory infections, such as colds, flu, or sinus infections.  Dental problems or teeth clenching.  Fatigue.  Holding your head and neck in the same position for a long period of time, such as while using a computer.  Smoking. SYMPTOMS Symptoms of this condition include:  A feeling of pressure around the head.  Dull, aching head pain.  Pain felt over the front and sides of the head.  Tenderness in the muscles of the head, neck, and shoulders. DIAGNOSIS This condition may be diagnosed based on your symptoms and a physical exam. Tests may be done, such as a CT scan or an MRI of your head. These tests may be done if your symptoms are severe or unusual. TREATMENT This condition may be treated with lifestyle changes and medicines to help relieve symptoms. HOME CARE INSTRUCTIONS Managing Pain   Take over-the-counter and prescription medicines only as told by your health care provider.  Lie down in a dark, quiet room when you have a headache.  If directed, apply ice to the head and neck area:  Put ice in a plastic  bag.  Place a towel between your skin and the bag.  Leave the ice on for 20 minutes, 2-3 times per day.  Use a heating pad or a hot shower to apply heat to the head and neck area as told by your health care provider. Eating and Drinking   Eat meals on a regular schedule.  Limit alcohol use.  Decrease your caffeine intake, or stop using caffeine. General Instructions   Keep all follow-up visits as told by your health care provider. This is important.  Keep a headache journal to help find out what may trigger your headaches. For example, write down:  What you eat and drink.  How much sleep you get.  Any change to your diet or medicines.  Try massage or other relaxation techniques.  Limit stress.  Sit up straight, and avoid tensing your muscles.  Do not use tobacco products, including cigarettes, chewing tobacco, or e-cigarettes. If you need help quitting, ask your health care provider.  Exercise regularly as told by your health care provider.  Get 7-9 hours of sleep, or the amount recommended by your health care provider. SEEK MEDICAL CARE IF:  Your symptoms are not helped by medicine.  You have a headache that is different from what you normally experience.  You have nausea or you vomit.  You have a fever. SEEK IMMEDIATE MEDICAL CARE IF:  Your headache becomes severe.  You have repeated vomiting.  You have a stiff neck.  You have a loss of vision.  You have problems  with speech.  You have pain in your eye or ear.  You have muscular weakness or loss of muscle control.  You lose your balance or you have trouble walking.  You feel faint or you pass out.  You have confusion. This information is not intended to replace advice given to you by your health care provider. Make sure you discuss any questions you have with your health care provider. Document Released: 08/30/2005 Document Revised: 05/21/2015 Document Reviewed: 12/23/2014 Elsevier Interactive  Patient Education  2017 [employer].

## 2017-01-27 ENCOUNTER — Ambulatory Visit (INDEPENDENT_AMBULATORY_CARE_PROVIDER_SITE_OTHER): Payer: Self-pay

## 2017-01-27 DIAGNOSIS — J309 Allergic rhinitis, unspecified: Secondary | ICD-10-CM

## 2017-01-30 ENCOUNTER — Ambulatory Visit
Admission: RE | Admit: 2017-01-30 | Discharge: 2017-01-30 | Disposition: A | Discharge status: Home / Self Care | Payer: PRIVATE HEALTH INSURANCE | Source: Ambulatory Visit | Attending: Family Medicine | Admitting: Family Medicine

## 2017-01-30 DIAGNOSIS — R51 Headache: Principal | ICD-10-CM

## 2017-01-30 DIAGNOSIS — R519 Headache, unspecified: Secondary | ICD-10-CM

## 2017-01-30 MED ORDER — GADOBENATE DIMEGLUMINE 529 MG/ML IV SOLN
15.0000 mL | Freq: Once | INTRAVENOUS | Status: AC | PRN
  Administered 2017-01-30: 15 mL via INTRAVENOUS

## 2017-02-02 ENCOUNTER — Ambulatory Visit (INDEPENDENT_AMBULATORY_CARE_PROVIDER_SITE_OTHER): Payer: PRIVATE HEALTH INSURANCE | Admitting: Family Medicine

## 2017-02-02 ENCOUNTER — Encounter: Payer: Self-pay | Admitting: Family Medicine

## 2017-02-02 VITALS — BP 112/73 | HR 88 | Temp 98.2°F | Ht 61.5 in | Wt 167.0 lb

## 2017-02-02 DIAGNOSIS — J018 Other acute sinusitis: Secondary | ICD-10-CM

## 2017-02-02 MED ORDER — AMOXICILLIN-POT CLAVULANATE 875-125 MG PO TABS: 1.0000 | ORAL_TABLET | Freq: Two times a day (BID) | ORAL | 0 refills | Status: DC

## 2017-02-02 MED ORDER — HYDROCODONE-HOMATROPINE 5-1.5 MG/5ML PO SYRP: 5.0000 mL | ORAL_SOLUTION | ORAL | 0 refills | Status: DC | PRN

## 2017-02-02 NOTE — Progress Notes (Signed)
   Subjective:    Patient ID: [facility], female    DOB: [birth year], 51 y.o.   MRN: [id number]  HPI Here for 5 days of sinus pressure, PND, ST , and coughing up green sputum. No fever.    Review of Systems  Constitutional: Negative.   HENT: Positive for congestion, postnasal drip, sinus pain, sinus pressure and sore throat. Negative for ear pain.   Eyes: Negative.   Respiratory: Positive for cough.        Objective:   Physical Exam  Constitutional: She appears well-developed and well-nourished.  HENT:  Right Ear: External ear normal.  Left Ear: External ear normal.  Nose: Nose normal.  Mouth/Throat: Oropharynx is clear and moist.  Eyes: Conjunctivae are normal.  Neck: No thyromegaly present.  Pulmonary/Chest: Effort normal and breath sounds normal.  Lymphadenopathy:    She has no cervical adenopathy.          Assessment & Plan:  Sinusitis, treat with Augmentin.  Alysia Penna, MD

## 2017-02-02 NOTE — Patient Instructions (Signed)
WE NOW OFFER   [facility] FAST TRACK!!!  SAME DAY Appointments for ACUTE CARE  Such as: Sprains, Injuries, cuts, abrasions, rashes, muscle pain, joint pain, back pain Colds, flu, sore throats, headache, allergies, cough, fever  Ear pain, sinus and eye infections Abdominal pain, nausea, vomiting, diarrhea, upset stomach Animal/insect bites  3 Easy Ways to Schedule: Walk-In Scheduling Call in scheduling Mychart Sign-up: [email or web address]         

## 2017-02-03 ENCOUNTER — Ambulatory Visit: Payer: PRIVATE HEALTH INSURANCE | Admitting: Family Medicine

## 2017-02-08 ENCOUNTER — Telehealth: Payer: Self-pay | Admitting: Allergy

## 2017-02-08 NOTE — Telephone Encounter (Signed)
Patient called and wanted to know her balance. I looked it up in payment posting in [place]. It showed a $0 balance. She was surprised by that. I asked her if it could be in the old system and she didn't know. She said she has been a patient here quite awhile. I don't know how to look up old balances in [facility]. Could one of you look it up and let me know. I would be happy to call her back if so. Thank you.

## 2017-02-08 NOTE — Telephone Encounter (Signed)
We have rec'd no payments or correspondence from ins  - tried to call them but after holding 21 minutes, decided to try again later - will call pt when I find out something - kt

## 2017-02-10 ENCOUNTER — Ambulatory Visit (INDEPENDENT_AMBULATORY_CARE_PROVIDER_SITE_OTHER): Payer: PRIVATE HEALTH INSURANCE | Admitting: *Deleted

## 2017-02-10 DIAGNOSIS — J309 Allergic rhinitis, unspecified: Secondary | ICD-10-CM

## 2017-02-13 ENCOUNTER — Other Ambulatory Visit: Payer: Self-pay | Admitting: Allergy

## 2017-02-17 ENCOUNTER — Ambulatory Visit (INDEPENDENT_AMBULATORY_CARE_PROVIDER_SITE_OTHER): Payer: PRIVATE HEALTH INSURANCE | Admitting: *Deleted

## 2017-02-17 DIAGNOSIS — J309 Allergic rhinitis, unspecified: Secondary | ICD-10-CM | POA: Diagnosis not present

## 2017-02-23 ENCOUNTER — Other Ambulatory Visit: Payer: Self-pay | Admitting: Internal Medicine

## 2017-02-23 NOTE — Progress Notes (Signed)
Vials to be made 02-25-17  jm

## 2017-02-25 DIAGNOSIS — J3089 Other allergic rhinitis: Secondary | ICD-10-CM | POA: Diagnosis not present

## 2017-03-11 ENCOUNTER — Encounter: Payer: Self-pay | Admitting: Internal Medicine

## 2017-03-11 ENCOUNTER — Ambulatory Visit (INDEPENDENT_AMBULATORY_CARE_PROVIDER_SITE_OTHER): Payer: PRIVATE HEALTH INSURANCE | Admitting: Internal Medicine

## 2017-03-11 VITALS — BP 112/68 | HR 77 | Temp 98.1°F | Ht 61.25 in | Wt 168.2 lb

## 2017-03-11 DIAGNOSIS — Z8669 Personal history of other diseases of the nervous system and sense organs: Secondary | ICD-10-CM

## 2017-03-11 DIAGNOSIS — Z0000 Encounter for general adult medical examination without abnormal findings: Secondary | ICD-10-CM | POA: Diagnosis not present

## 2017-03-11 DIAGNOSIS — D353 Benign neoplasm of craniopharyngeal duct: Secondary | ICD-10-CM

## 2017-03-11 DIAGNOSIS — E221 Hyperprolactinemia: Secondary | ICD-10-CM | POA: Diagnosis not present

## 2017-03-11 DIAGNOSIS — D352 Benign neoplasm of pituitary gland: Secondary | ICD-10-CM | POA: Diagnosis not present

## 2017-03-11 LAB — CBC WITH DIFFERENTIAL/PLATELET
BASOS ABS: 0 10*3/uL (ref 0.0–0.1)
BASOS PCT: 0.8 % (ref 0.0–3.0)
EOS ABS: 0.3 10*3/uL (ref 0.0–0.7)
Eosinophils Relative: 5.7 % — ABNORMAL HIGH (ref 0.0–5.0)
HCT: 42.5 % (ref 36.0–46.0)
Hemoglobin: 14.5 g/dL (ref 12.0–15.0)
Lymphocytes Relative: 29.2 % (ref 12.0–46.0)
Lymphs Abs: 1.5 10*3/uL (ref 0.7–4.0)
MCHC: 34.1 g/dL (ref 30.0–36.0)
MCV: 90.8 fl (ref 78.0–100.0)
Monocytes Absolute: 0.4 10*3/uL (ref 0.1–1.0)
Monocytes Relative: 8.3 % (ref 3.0–12.0)
Neutro Abs: 2.9 10*3/uL (ref 1.4–7.7)
Neutrophils Relative %: 56 % (ref 43.0–77.0)
PLATELETS: 309 10*3/uL (ref 150.0–400.0)
RBC: 4.68 Mil/uL (ref 3.87–5.11)
RDW: 13.2 % (ref 11.5–15.5)
WBC: 5.1 10*3/uL (ref 4.0–10.5)

## 2017-03-11 LAB — LIPID PANEL
CHOLESTEROL: 208 mg/dL — AB (ref 0–200)
HDL: 57.1 mg/dL (ref >39.00)
LDL CALC: 137 mg/dL — AB (ref 0–99)
NonHDL: 150.7
TRIGLYCERIDES: 67 mg/dL (ref 0.0–149.0)
Total CHOL/HDL Ratio: 4
VLDL: 13.4 mg/dL (ref 0.0–40.0)

## 2017-03-11 LAB — COMPREHENSIVE METABOLIC PANEL
ALBUMIN: 4.4 g/dL (ref 3.5–5.2)
ALK PHOS: 59 U/L (ref 39–117)
ALT: 23 U/L (ref 0–35)
AST: 17 U/L (ref 0–37)
BILIRUBIN TOTAL: 0.5 mg/dL (ref 0.2–1.2)
BUN: 13 mg/dL (ref 6–23)
CALCIUM: 9.4 mg/dL (ref 8.4–10.5)
CHLORIDE: 107 meq/L (ref 96–112)
CO2: 27 mEq/L (ref 19–32)
CREATININE: 0.59 mg/dL (ref 0.40–1.20)
GFR: 114.31 mL/min (ref >60.00)
Glucose, Bld: 90 mg/dL (ref 70–99)
Potassium: 4.8 mEq/L (ref 3.5–5.1)
SODIUM: 141 meq/L (ref 135–145)
TOTAL PROTEIN: 6.7 g/dL (ref 6.0–8.3)

## 2017-03-11 LAB — TSH: TSH: 1.49 u[IU]/mL (ref 0.35–4.50)

## 2017-03-11 MED ORDER — RIZATRIPTAN BENZOATE 10 MG PO TABS: 10.0000 mg | ORAL_TABLET | ORAL | 0 refills | Status: DC | PRN

## 2017-03-11 NOTE — Patient Instructions (Addendum)
WE NOW OFFER   [facility] FAST TRACK!!!  SAME DAY Appointments for ACUTE CARE  Such as: Sprains, Injuries, cuts, abrasions, rashes, muscle pain, joint pain, back pain Colds, flu, sore throats, headache, allergies, cough, fever  Ear pain, sinus and eye infections Abdominal pain, nausea, vomiting, diarrhea, upset stomach Animal/insect bites  3 Easy Ways to Schedule: Walk-In Scheduling Call in scheduling Mychart Sign-up: [email or web address]  Gynecology follow-up as discussed  Schedule your colonoscopy to help detect colon cancer.

## 2017-03-11 NOTE — Progress Notes (Signed)
Subjective:    Patient ID: Chelsea Dyer, female    DOB: 1966/02/21, 51 y.o.   MRN: [id number]  HPI  51 year old patient who is seen today for a preventive health examination Medical  issues include significant allergic rhinitis.  She continues to receive immunotherapy.  She has a history of migraine headaches and has used Imitrex in the past, which has not been helpful.  She recently responded quite well to [place].  She has a history of a pituitary microadenoma since 2011.  Interestingly, her history of a left pituitary microadenoma was felt to be artifact on a recent technically superior MRI.  She was noted have a 3 mm right pituitary microadenoma. The patient presented with galactorrhea in 2011 and had been treated with bromocriptine 2.5 milligrams daily.  This was discontinued a few years ago after normalization of her prolactin levels.  The patient has had no recurrent galactorrhea and still has active monthly periods.  She is scheduled for gynecology follow-up later this summer  Past Medical History:  Diagnosis Date  . ALLERGIC RHINITIS 03/04/2008  . Anxiety   . DEPRESSION 03/04/2008  . Edema    pedal  . EXOGENOUS OBESITY 06/22/2010  . 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 Prolactinoma [facility])      Social History   Social History  . Marital status: Married    Spouse name: N/A  . Number of children: N/A  . Years of education: N/A   Occupational History  . Not on file.   Social History Main Topics  . Smoking status: Never Smoker  . Smokeless tobacco: Never Used  . Alcohol use No  . Drug use: No  . Sexual activity: Not Currently   Other Topics Concern  . Not on file   Social History Narrative  . No narrative on file    Past Surgical History:  Procedure Laterality Date  . BREAST SURGERY  2004   augmentation  . HAMMER TOE SURGERY    . LIPOSUCTION    . NASAL SINUS SURGERY      Family History  Problem Relation Age of Onset  . Arthritis Mother        osteo  .  Allergic rhinitis Mother     Allergies  Allergen Reactions  . Percocet [Oxycodone-Acetaminophen] Other (See Comments)    Interfered with patient's antidepressants; made pt feel dizzy  . Septra [Sulfamethoxazole-Trimethoprim] Rash    Current Outpatient Prescriptions on File Prior to Visit  Medication Sig Dispense Refill  . azelastine (ASTELIN) 0.1 % nasal spray Can use two sprays in each nostril twice daily as needed for itchy nose and sneezing. 30 mL 5  . Bepotastine Besilate (BEPREVE) 1.5 % SOLN Can use one drop in each eye twice a day for itchy eyes. 10 mL 5  . cetirizine (ZYRTEC) 10 MG tablet Take 20 mg by mouth daily.     . Cyanocobalamin (VITAMIN B 12 PO) Take by mouth daily.    . Fish Oil-Cholecalciferol (FISH OIL + D3) 1000-1000 MG-UNIT CAPS Take by mouth.    . fluticasone (FLONASE) 50 MCG/ACT nasal spray Place 2 sprays into both nostrils daily. (Patient taking differently: Place 2 sprays into both nostrils daily. States using prn) 16 g 6  . ibuprofen (ADVIL) 200 MG tablet Take 800 mg by mouth every 8 (eight) hours as needed.    Marland Kitchen LORazepam (ATIVAN) 1 MG tablet Take 1 tablet (1 mg total) by mouth 2 (two) times daily as needed for anxiety. 30 tablet 0  .  montelukast (SINGULAIR) 10 MG tablet TAKE ONE TABLET BY MOUTH DAILY 30 tablet 2  . Pyridoxine HCl (VITAMIN B-6 PO) Take by mouth daily.    Marland Kitchen venlafaxine XR (EFFEXOR-XR) 150 MG 24 hr capsule TAKE 1 CAPSULE BY MOUTH DAILY 30 capsule 3  . venlafaxine XR (EFFEXOR-XR) 75 MG 24 hr capsule TAKE 1 CAPSULE BY MOUTH ONCE DAILY 90 capsule 2   No current facility-administered medications on file prior to visit.     BP 112/68 (BP Location: Left Arm, Patient Position: Sitting, Cuff Size: Normal)   Pulse 77   Temp 98.1 F (36.7 C) (Oral)   Ht 5' 1.25" (1.556 m)   Wt 168 lb 3.2 oz (76.3 kg)   LMP 02/20/2017 (Exact Date)   SpO2 98%   BMI 31.52 kg/m     Review of Systems  Constitutional: Negative.   HENT: Negative for congestion,  dental problem, hearing loss, rhinorrhea, sinus pressure, sore throat and tinnitus.   Eyes: Negative for pain, discharge and visual disturbance.  Respiratory: Negative for cough and shortness of breath.   Cardiovascular: Negative for chest pain, palpitations and leg swelling.  Gastrointestinal: Negative for abdominal distention, abdominal pain, blood in stool, constipation, diarrhea, nausea and vomiting.  Genitourinary: Negative for difficulty urinating, dysuria, flank pain, frequency, hematuria, pelvic pain, urgency, vaginal bleeding, vaginal discharge and vaginal pain.  Musculoskeletal: Negative for arthralgias, gait problem and joint swelling.  Skin: Negative for rash.  Neurological: Positive for headaches. Negative for dizziness, syncope, speech difficulty, weakness and numbness.  Hematological: Negative for adenopathy.  Psychiatric/Behavioral: Negative for agitation, behavioral problems and dysphoric mood. The patient is not nervous/anxious.        Objective:   Physical Exam  Constitutional: She is oriented to person, place, and time. She appears well-developed and well-nourished.  Weight 168 Blood pressure low normal  HENT:  Head: Normocephalic.  Right Ear: External ear normal.  Left Ear: External ear normal.  Mouth/Throat: Oropharynx is clear and moist.  Eyes: Conjunctivae and EOM are normal. Pupils are equal, round, and reactive to light.  Neck: Normal range of motion. Neck supple. No thyromegaly present.  Cardiovascular: Normal rate, regular rhythm, normal heart sounds and intact distal pulses.   Pulmonary/Chest: Effort normal and breath sounds normal.  Abdominal: Soft. Bowel sounds are normal. She exhibits no mass. There is no tenderness.  Musculoskeletal: Normal range of motion.  Lymphadenopathy:    She has no cervical adenopathy.  Neurological: She is alert and oriented to person, place, and time.  Skin: Skin is warm and dry. No rash noted.  Psychiatric: She has a normal  mood and affect. Her behavior is normal.          Assessment & Plan:   Preventive health examination.  Will check screening lab and schedule initial colonoscopy.  OB/GYN.  Follow-up History of pituitary microadenoma with hyperprolactinemia.  We'll check a follow-up prolactin level.  If elevated, will consider restarting bromocriptine at a dose of 2.5 milligrams daily Migraine headaches.  Continue Maxalt when necessary Severe allergic rhinitis.  Follow-up allergy medicine and continue immunotherapy  Nyoka Cowden

## 2017-03-12 LAB — PROLACTIN: Prolactin: 44.6 ng/mL — ABNORMAL HIGH

## 2017-03-15 ENCOUNTER — Ambulatory Visit (INDEPENDENT_AMBULATORY_CARE_PROVIDER_SITE_OTHER): Payer: PRIVATE HEALTH INSURANCE

## 2017-03-15 ENCOUNTER — Other Ambulatory Visit: Payer: Self-pay | Admitting: Internal Medicine

## 2017-03-15 DIAGNOSIS — J309 Allergic rhinitis, unspecified: Secondary | ICD-10-CM

## 2017-03-15 DIAGNOSIS — M79676 Pain in unspecified toe(s): Secondary | ICD-10-CM

## 2017-03-15 DIAGNOSIS — Z1231 Encounter for screening mammogram for malignant neoplasm of breast: Secondary | ICD-10-CM

## 2017-03-17 ENCOUNTER — Ambulatory Visit
Admission: RE | Admit: 2017-03-17 | Discharge: 2017-03-17 | Disposition: A | Discharge status: Home / Self Care | Payer: PRIVATE HEALTH INSURANCE | Source: Ambulatory Visit | Attending: Internal Medicine | Admitting: Internal Medicine

## 2017-03-17 DIAGNOSIS — Z1231 Encounter for screening mammogram for malignant neoplasm of breast: Secondary | ICD-10-CM

## 2017-03-24 ENCOUNTER — Ambulatory Visit (INDEPENDENT_AMBULATORY_CARE_PROVIDER_SITE_OTHER): Payer: PRIVATE HEALTH INSURANCE | Admitting: *Deleted

## 2017-03-24 DIAGNOSIS — J309 Allergic rhinitis, unspecified: Secondary | ICD-10-CM | POA: Diagnosis not present

## 2017-03-31 ENCOUNTER — Ambulatory Visit (INDEPENDENT_AMBULATORY_CARE_PROVIDER_SITE_OTHER): Payer: PRIVATE HEALTH INSURANCE | Admitting: *Deleted

## 2017-03-31 DIAGNOSIS — J309 Allergic rhinitis, unspecified: Secondary | ICD-10-CM | POA: Diagnosis not present

## 2017-04-07 ENCOUNTER — Ambulatory Visit (INDEPENDENT_AMBULATORY_CARE_PROVIDER_SITE_OTHER): Payer: PRIVATE HEALTH INSURANCE | Admitting: *Deleted

## 2017-04-07 DIAGNOSIS — J309 Allergic rhinitis, unspecified: Secondary | ICD-10-CM | POA: Diagnosis not present

## 2017-04-11 ENCOUNTER — Encounter: Payer: Self-pay | Admitting: Internal Medicine

## 2017-04-11 ENCOUNTER — Telehealth: Payer: Self-pay | Admitting: Allergy and Immunology

## 2017-04-11 NOTE — Telephone Encounter (Signed)
Please advise what pt can take to decrease swelling.

## 2017-04-11 NOTE — Telephone Encounter (Signed)
Patient was stung by 5-6 bees this weekend. She took Benadryl, but said she is still swollen and wants to know if this is normal.

## 2017-04-11 NOTE — Telephone Encounter (Signed)
Please advise 

## 2017-04-11 NOTE — Telephone Encounter (Signed)
Pt advised.

## 2017-04-11 NOTE — Telephone Encounter (Signed)
Yes, large swelling reaction can last 1-2 weeks. Can take OTC antihistamine and OTC hydrocortisone cream. May need prednisone if swelling looks like it is progressive.

## 2017-04-14 ENCOUNTER — Ambulatory Visit (INDEPENDENT_AMBULATORY_CARE_PROVIDER_SITE_OTHER): Payer: PRIVATE HEALTH INSURANCE | Admitting: *Deleted

## 2017-04-14 DIAGNOSIS — J309 Allergic rhinitis, unspecified: Secondary | ICD-10-CM

## 2017-04-18 ENCOUNTER — Other Ambulatory Visit: Payer: Self-pay | Admitting: Internal Medicine

## 2017-04-26 ENCOUNTER — Other Ambulatory Visit: Payer: Self-pay | Admitting: Internal Medicine

## 2017-05-12 ENCOUNTER — Ambulatory Visit (INDEPENDENT_AMBULATORY_CARE_PROVIDER_SITE_OTHER): Payer: PRIVATE HEALTH INSURANCE | Admitting: *Deleted

## 2017-05-12 DIAGNOSIS — J309 Allergic rhinitis, unspecified: Secondary | ICD-10-CM

## 2017-05-20 ENCOUNTER — Ambulatory Visit (INDEPENDENT_AMBULATORY_CARE_PROVIDER_SITE_OTHER): Payer: PRIVATE HEALTH INSURANCE

## 2017-05-20 DIAGNOSIS — J309 Allergic rhinitis, unspecified: Secondary | ICD-10-CM | POA: Diagnosis not present

## 2017-05-24 ENCOUNTER — Other Ambulatory Visit: Payer: Self-pay | Admitting: Internal Medicine

## 2017-05-26 ENCOUNTER — Ambulatory Visit (INDEPENDENT_AMBULATORY_CARE_PROVIDER_SITE_OTHER): Payer: PRIVATE HEALTH INSURANCE | Admitting: *Deleted

## 2017-05-26 DIAGNOSIS — J309 Allergic rhinitis, unspecified: Secondary | ICD-10-CM

## 2017-06-02 ENCOUNTER — Encounter: Payer: Self-pay | Admitting: Internal Medicine

## 2017-06-14 ENCOUNTER — Ambulatory Visit (INDEPENDENT_AMBULATORY_CARE_PROVIDER_SITE_OTHER): Payer: Self-pay | Admitting: *Deleted

## 2017-06-14 DIAGNOSIS — J309 Allergic rhinitis, unspecified: Secondary | ICD-10-CM

## 2017-06-30 ENCOUNTER — Ambulatory Visit (INDEPENDENT_AMBULATORY_CARE_PROVIDER_SITE_OTHER): Payer: PRIVATE HEALTH INSURANCE | Admitting: *Deleted

## 2017-06-30 DIAGNOSIS — J309 Allergic rhinitis, unspecified: Secondary | ICD-10-CM | POA: Diagnosis not present

## 2017-07-01 ENCOUNTER — Other Ambulatory Visit: Payer: Self-pay | Admitting: Internal Medicine

## 2017-07-02 ENCOUNTER — Other Ambulatory Visit: Payer: Self-pay | Admitting: Internal Medicine

## 2017-07-06 ENCOUNTER — Ambulatory Visit (INDEPENDENT_AMBULATORY_CARE_PROVIDER_SITE_OTHER): Payer: PRIVATE HEALTH INSURANCE | Admitting: *Deleted

## 2017-07-06 DIAGNOSIS — J309 Allergic rhinitis, unspecified: Secondary | ICD-10-CM | POA: Diagnosis not present

## 2017-07-14 ENCOUNTER — Other Ambulatory Visit: Payer: Self-pay | Admitting: Internal Medicine

## 2017-07-21 ENCOUNTER — Ambulatory Visit (INDEPENDENT_AMBULATORY_CARE_PROVIDER_SITE_OTHER): Payer: PRIVATE HEALTH INSURANCE | Admitting: *Deleted

## 2017-07-21 DIAGNOSIS — J309 Allergic rhinitis, unspecified: Secondary | ICD-10-CM

## 2017-08-08 ENCOUNTER — Other Ambulatory Visit: Payer: Self-pay | Admitting: Allergy

## 2017-08-17 ENCOUNTER — Ambulatory Visit (INDEPENDENT_AMBULATORY_CARE_PROVIDER_SITE_OTHER): Payer: PRIVATE HEALTH INSURANCE

## 2017-08-17 DIAGNOSIS — J309 Allergic rhinitis, unspecified: Secondary | ICD-10-CM | POA: Diagnosis not present

## 2017-08-22 ENCOUNTER — Other Ambulatory Visit: Payer: Self-pay | Admitting: Internal Medicine

## 2017-08-31 DIAGNOSIS — M79676 Pain in unspecified toe(s): Secondary | ICD-10-CM

## 2017-09-03 ENCOUNTER — Other Ambulatory Visit: Payer: Self-pay | Admitting: Internal Medicine

## 2017-09-05 ENCOUNTER — Other Ambulatory Visit: Payer: Self-pay | Admitting: Allergy

## 2017-09-15 ENCOUNTER — Ambulatory Visit (INDEPENDENT_AMBULATORY_CARE_PROVIDER_SITE_OTHER): Payer: Self-pay

## 2017-09-15 DIAGNOSIS — J309 Allergic rhinitis, unspecified: Secondary | ICD-10-CM

## 2017-09-20 ENCOUNTER — Ambulatory Visit (INDEPENDENT_AMBULATORY_CARE_PROVIDER_SITE_OTHER): Payer: Self-pay | Admitting: *Deleted

## 2017-09-20 DIAGNOSIS — J309 Allergic rhinitis, unspecified: Secondary | ICD-10-CM

## 2017-09-21 ENCOUNTER — Encounter: Payer: Self-pay | Admitting: *Deleted

## 2017-09-21 DIAGNOSIS — J3089 Other allergic rhinitis: Secondary | ICD-10-CM

## 2017-09-21 NOTE — Progress Notes (Signed)
VIALS MADE EXP: 09-21-18. HV

## 2017-10-03 ENCOUNTER — Other Ambulatory Visit: Payer: Self-pay | Admitting: Allergy

## 2017-10-05 ENCOUNTER — Other Ambulatory Visit: Payer: Self-pay | Admitting: Internal Medicine

## 2017-10-06 ENCOUNTER — Ambulatory Visit (INDEPENDENT_AMBULATORY_CARE_PROVIDER_SITE_OTHER): Payer: Self-pay | Admitting: *Deleted

## 2017-10-06 DIAGNOSIS — J309 Allergic rhinitis, unspecified: Secondary | ICD-10-CM

## 2017-10-20 ENCOUNTER — Ambulatory Visit (INDEPENDENT_AMBULATORY_CARE_PROVIDER_SITE_OTHER): Payer: Self-pay | Admitting: *Deleted

## 2017-10-20 DIAGNOSIS — J309 Allergic rhinitis, unspecified: Secondary | ICD-10-CM

## 2017-10-27 ENCOUNTER — Ambulatory Visit (INDEPENDENT_AMBULATORY_CARE_PROVIDER_SITE_OTHER): Payer: Self-pay | Admitting: *Deleted

## 2017-10-27 DIAGNOSIS — J309 Allergic rhinitis, unspecified: Secondary | ICD-10-CM

## 2017-11-03 ENCOUNTER — Ambulatory Visit (INDEPENDENT_AMBULATORY_CARE_PROVIDER_SITE_OTHER): Payer: Self-pay | Admitting: *Deleted

## 2017-11-03 DIAGNOSIS — J309 Allergic rhinitis, unspecified: Secondary | ICD-10-CM

## 2017-11-17 ENCOUNTER — Ambulatory Visit (INDEPENDENT_AMBULATORY_CARE_PROVIDER_SITE_OTHER): Payer: Self-pay | Admitting: *Deleted

## 2017-11-17 DIAGNOSIS — J309 Allergic rhinitis, unspecified: Secondary | ICD-10-CM

## 2017-11-19 ENCOUNTER — Other Ambulatory Visit: Payer: Self-pay | Admitting: Internal Medicine

## 2017-11-22 DIAGNOSIS — Z01419 Encounter for gynecological examination (general) (routine) without abnormal findings: Secondary | ICD-10-CM | POA: Diagnosis not present

## 2017-11-22 DIAGNOSIS — Z6832 Body mass index (BMI) 32.0-32.9, adult: Secondary | ICD-10-CM | POA: Diagnosis not present

## 2017-12-01 ENCOUNTER — Ambulatory Visit (INDEPENDENT_AMBULATORY_CARE_PROVIDER_SITE_OTHER): Payer: Self-pay

## 2017-12-01 DIAGNOSIS — J309 Allergic rhinitis, unspecified: Secondary | ICD-10-CM

## 2017-12-02 ENCOUNTER — Other Ambulatory Visit: Payer: Self-pay | Admitting: Internal Medicine

## 2017-12-02 NOTE — Telephone Encounter (Signed)
Called patient to schedule an office visit for more refills.

## 2017-12-05 ENCOUNTER — Ambulatory Visit (INDEPENDENT_AMBULATORY_CARE_PROVIDER_SITE_OTHER): Payer: BLUE CROSS/BLUE SHIELD | Admitting: Internal Medicine

## 2017-12-05 ENCOUNTER — Encounter: Payer: Self-pay | Admitting: Internal Medicine

## 2017-12-05 VITALS — BP 110/70 | HR 73 | Temp 98.5°F | Wt 177.0 lb

## 2017-12-05 DIAGNOSIS — E221 Hyperprolactinemia: Secondary | ICD-10-CM

## 2017-12-05 DIAGNOSIS — J301 Allergic rhinitis due to pollen: Secondary | ICD-10-CM | POA: Diagnosis not present

## 2017-12-05 NOTE — Patient Instructions (Signed)
It is important that you exercise regularly, at least 20 minutes 3 to 4 times per week.  If you develop chest pain or shortness of breath seek  medical attention.  Return in 6 months for follow-up  

## 2017-12-05 NOTE — Progress Notes (Signed)
Subjective:    Patient ID: Chelsea Dyer, female    DOB: November 21, 1965, 52 y.o.   MRN: [id number]  HPI  52 year old patient who is seen today in follow-up.  She has a history of depression which has been well controlled on Effexor 225 mg daily.  She feels well today and has no concerns or complaints.  She is followed annually by gynecology.  She does have a history of a pituitary microadenoma and hyperprolactinemia.  She has been treated with bromocriptine in the past.  She has allergic rhinitis   Past Medical History:  Diagnosis Date  . ALLERGIC RHINITIS 03/04/2008  . Anxiety   . DEPRESSION 03/04/2008  . Edema    pedal  . EXOGENOUS OBESITY 06/22/2010  . 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 Prolactinoma [facility])      Social History   Socioeconomic History  . Marital status: Married    Spouse name: Not on file  . Number of children: Not on file  . Years of education: Not on file  . Highest education level: Not on file  Occupational History  . Not on file  Social Needs  . Financial resource strain: Not on file  . Food insecurity:    Worry: Not on file    Inability: Not on file  . Transportation needs:    Medical: Not on file    Non-medical: Not on file  Tobacco Use  . Smoking status: Never Smoker  . Smokeless tobacco: Never Used  Substance and Sexual Activity  . Alcohol use: No    Alcohol/week: 0.0 oz  . Drug use: No  . Sexual activity: Not Currently  Lifestyle  . Physical activity:    Days per week: Not on file    Minutes per session: Not on file  . Stress: Not on file  Relationships  . Social connections:    Talks on phone: Not on file    Gets together: Not on file    Attends religious service: Not on file    Active member of club or organization: Not on file    Attends meetings of clubs or organizations: Not on file    Relationship status: Not on file  . Intimate partner violence:    Fear of current or ex partner: Not on file    Emotionally abused: Not on file      Physically abused: Not on file    Forced sexual activity: Not on file  Other Topics Concern  . Not on file  Social History Narrative  . Not on file    Past Surgical History:  Procedure Laterality Date  . BREAST SURGERY  2004   augmentation  . HAMMER TOE SURGERY    . LIPOSUCTION    . NASAL SINUS SURGERY      Family History  Problem Relation Age of Onset  . Arthritis Mother        osteo  . Allergic rhinitis Mother     Allergies  Allergen Reactions  . Percocet [Oxycodone-Acetaminophen] Other (See Comments)    Interfered with patient's antidepressants; made pt feel dizzy  . Septra [Sulfamethoxazole-Trimethoprim] Rash    Current Outpatient Medications on File Prior to Visit  Medication Sig Dispense Refill  . azelastine (ASTELIN) 0.1 % nasal spray Can use two sprays in each nostril twice daily as needed for itchy nose and sneezing. 30 mL 5  . Bepotastine Besilate (BEPREVE) 1.5 % SOLN Can use one drop in each eye twice a day for itchy eyes. 10 mL  5  . Black Cohosh-SoyIsoflav-Magnol (ESTROVEN MENOPAUSE RELIEF) CAPS Take 1 tablet by mouth daily.    . cetirizine (ZYRTEC) 10 MG tablet Take 20 mg by mouth daily.     . Cyanocobalamin (VITAMIN B 12 PO) Take by mouth daily.    Marland Kitchen ELDERBERRY PO Take 2 tablets by mouth as needed.    . Flaxseed, Linseed, (EQL FLAX SEED OIL) 1000 MG CAPS Take 2 tablets by mouth daily.    . fluticasone (FLONASE) 50 MCG/ACT nasal spray Place 2 sprays into both nostrils daily. (Patient taking differently: Place 2 sprays into both nostrils daily. States using prn) 16 g 6  . ibuprofen (ADVIL) 200 MG tablet Take 800 mg by mouth every 8 (eight) hours as needed.    Marland Kitchen LORazepam (ATIVAN) 1 MG tablet TAKE ONE TABLET BY MOUTH TWICE A DAY AS NEEDED FOR ANXIETY 30 tablet 0  . montelukast (SINGULAIR) 10 MG tablet TAKE ONE TABLET BY MOUTH DAILY 30 tablet 0  . Multiple Vitamin (MULTI-VITAMINS) TABS Take by mouth.    . Probiotic Product (PROBIOTIC-10 PO) Take 1 tablet by  mouth daily.    . Pyridoxine HCl (VITAMIN B-6 PO) Take by mouth daily.    . rizatriptan (MAXALT) 10 MG tablet TAKE 1 TABLET BY MOUTH AS NEEDED FOR MIGRAINES. MAY REPEAT IN 2 HOURS IF NEEDED 9 tablet 0  . sodium chloride (V-R NASAL SPRAY SALINE) 0.65 % nasal spray Place into the nose.    . SUMAtriptan (IMITREX) 100 MG tablet Take by mouth.    . TURMERIC PO Take 2 tablets by mouth daily.    Marland Kitchen venlafaxine XR (EFFEXOR-XR) 150 MG 24 hr capsule TAKE ONE CAPSULE BY MOUTH DAILY 90 capsule 0  . venlafaxine XR (EFFEXOR-XR) 75 MG 24 hr capsule TAKE 1 CAPSULE BY MOUTH ONCE DAILY 90 capsule 2   No current facility-administered medications on file prior to visit.     BP 110/70 (BP Location: Right Arm, Patient Position: Sitting, Cuff Size: Large)   Pulse 73   Temp 98.5 F (36.9 C) (Oral)   Wt 177 lb (80.3 kg)   LMP 10/24/2017   SpO2 98%   BMI 33.17 kg/m     Review of Systems  Constitutional: Negative.   HENT: Positive for congestion and rhinorrhea. Negative for dental problem, hearing loss, sinus pressure, sore throat and tinnitus.   Eyes: Negative for pain, discharge and visual disturbance.  Respiratory: Negative for cough and shortness of breath.   Cardiovascular: Negative for chest pain, palpitations and leg swelling.  Gastrointestinal: Negative for abdominal distention, abdominal pain, blood in stool, constipation, diarrhea, nausea and vomiting.  Genitourinary: Negative for difficulty urinating, dysuria, flank pain, frequency, hematuria, pelvic pain, urgency, vaginal bleeding, vaginal discharge and vaginal pain.  Musculoskeletal: Negative for arthralgias, gait problem and joint swelling.  Skin: Negative for rash.  Neurological: Negative for dizziness, syncope, speech difficulty, weakness, numbness and headaches.  Hematological: Negative for adenopathy.  Psychiatric/Behavioral: Positive for dysphoric mood. Negative for agitation and behavioral problems. The patient is nervous/anxious.         Objective:   Physical Exam  Constitutional: She is oriented to person, place, and time. She appears well-developed and well-nourished.  HENT:  Head: Normocephalic.  Right Ear: External ear normal.  Left Ear: External ear normal.  Mouth/Throat: Oropharynx is clear and moist.  Eyes: Pupils are equal, round, and reactive to light. Conjunctivae and EOM are normal.  Neck: Normal range of motion. Neck supple. No thyromegaly present.  Cardiovascular: Normal rate, regular  rhythm, normal heart sounds and intact distal pulses.  Pulmonary/Chest: Effort normal and breath sounds normal.  Abdominal: Soft. Bowel sounds are normal. She exhibits no mass. There is no tenderness.  Musculoskeletal: Normal range of motion.  Lymphadenopathy:    She has no cervical adenopathy.  Neurological: She is alert and oriented to person, place, and time.  Skin: Skin is warm and dry. No rash noted.  Psychiatric: She has a normal mood and affect. Her behavior is normal.          Assessment & Plan:  Depression.  Clinically stable.  No change in present regimen Hyperprolactinemia.  Stable at present.  Will follow-up in 6 months to consider follow-up of prolactin level  Nyoka Cowden

## 2017-12-20 ENCOUNTER — Ambulatory Visit (INDEPENDENT_AMBULATORY_CARE_PROVIDER_SITE_OTHER): Payer: Self-pay | Admitting: *Deleted

## 2017-12-20 DIAGNOSIS — J309 Allergic rhinitis, unspecified: Secondary | ICD-10-CM

## 2017-12-26 ENCOUNTER — Other Ambulatory Visit: Payer: Self-pay | Admitting: Internal Medicine

## 2018-01-12 ENCOUNTER — Ambulatory Visit (INDEPENDENT_AMBULATORY_CARE_PROVIDER_SITE_OTHER): Payer: Self-pay | Admitting: *Deleted

## 2018-01-12 DIAGNOSIS — J309 Allergic rhinitis, unspecified: Secondary | ICD-10-CM

## 2018-01-19 ENCOUNTER — Ambulatory Visit (INDEPENDENT_AMBULATORY_CARE_PROVIDER_SITE_OTHER): Payer: Self-pay

## 2018-01-19 DIAGNOSIS — J309 Allergic rhinitis, unspecified: Secondary | ICD-10-CM

## 2018-01-23 ENCOUNTER — Other Ambulatory Visit: Payer: Self-pay | Admitting: Internal Medicine

## 2018-02-01 NOTE — Progress Notes (Signed)
VIALS EXP 02-02-19 

## 2018-02-02 ENCOUNTER — Ambulatory Visit (INDEPENDENT_AMBULATORY_CARE_PROVIDER_SITE_OTHER): Payer: Self-pay

## 2018-02-02 DIAGNOSIS — J309 Allergic rhinitis, unspecified: Secondary | ICD-10-CM

## 2018-02-03 DIAGNOSIS — J3089 Other allergic rhinitis: Secondary | ICD-10-CM

## 2018-02-07 ENCOUNTER — Other Ambulatory Visit: Payer: Self-pay | Admitting: Internal Medicine

## 2018-02-07 NOTE — Telephone Encounter (Signed)
Okay to refill? 

## 2018-02-07 NOTE — Telephone Encounter (Signed)
Okay for refill?  

## 2018-02-14 ENCOUNTER — Ambulatory Visit (INDEPENDENT_AMBULATORY_CARE_PROVIDER_SITE_OTHER): Payer: Self-pay | Admitting: *Deleted

## 2018-02-14 DIAGNOSIS — J309 Allergic rhinitis, unspecified: Secondary | ICD-10-CM

## 2018-02-19 ENCOUNTER — Other Ambulatory Visit: Payer: Self-pay | Admitting: Internal Medicine

## 2018-02-23 ENCOUNTER — Other Ambulatory Visit: Payer: Self-pay | Admitting: Internal Medicine

## 2018-03-02 ENCOUNTER — Ambulatory Visit (INDEPENDENT_AMBULATORY_CARE_PROVIDER_SITE_OTHER): Payer: Self-pay | Admitting: *Deleted

## 2018-03-02 DIAGNOSIS — J309 Allergic rhinitis, unspecified: Secondary | ICD-10-CM

## 2018-03-09 ENCOUNTER — Ambulatory Visit (INDEPENDENT_AMBULATORY_CARE_PROVIDER_SITE_OTHER): Payer: Self-pay

## 2018-03-09 DIAGNOSIS — J309 Allergic rhinitis, unspecified: Secondary | ICD-10-CM

## 2018-03-18 ENCOUNTER — Other Ambulatory Visit: Payer: Self-pay | Admitting: Internal Medicine

## 2018-03-20 ENCOUNTER — Other Ambulatory Visit: Payer: Self-pay | Admitting: Internal Medicine

## 2018-03-22 ENCOUNTER — Ambulatory Visit (INDEPENDENT_AMBULATORY_CARE_PROVIDER_SITE_OTHER): Payer: Self-pay | Admitting: *Deleted

## 2018-03-22 DIAGNOSIS — J309 Allergic rhinitis, unspecified: Secondary | ICD-10-CM

## 2018-03-30 ENCOUNTER — Ambulatory Visit (INDEPENDENT_AMBULATORY_CARE_PROVIDER_SITE_OTHER): Payer: Self-pay | Admitting: *Deleted

## 2018-03-30 DIAGNOSIS — J309 Allergic rhinitis, unspecified: Secondary | ICD-10-CM

## 2018-04-07 ENCOUNTER — Ambulatory Visit (INDEPENDENT_AMBULATORY_CARE_PROVIDER_SITE_OTHER): Payer: Self-pay

## 2018-04-07 DIAGNOSIS — J309 Allergic rhinitis, unspecified: Secondary | ICD-10-CM

## 2018-04-10 ENCOUNTER — Other Ambulatory Visit: Payer: Self-pay | Admitting: Internal Medicine

## 2018-04-17 ENCOUNTER — Other Ambulatory Visit: Payer: Self-pay | Admitting: Internal Medicine

## 2018-04-21 ENCOUNTER — Ambulatory Visit (INDEPENDENT_AMBULATORY_CARE_PROVIDER_SITE_OTHER): Payer: Self-pay

## 2018-04-21 DIAGNOSIS — J309 Allergic rhinitis, unspecified: Secondary | ICD-10-CM

## 2018-05-01 ENCOUNTER — Other Ambulatory Visit: Payer: Self-pay | Admitting: Internal Medicine

## 2018-05-01 ENCOUNTER — Other Ambulatory Visit: Payer: Self-pay | Admitting: Allergy

## 2018-05-01 ENCOUNTER — Other Ambulatory Visit: Payer: Self-pay | Admitting: Family Medicine

## 2018-05-01 DIAGNOSIS — G43809 Other migraine, not intractable, without status migrainosus: Secondary | ICD-10-CM

## 2018-05-01 NOTE — Telephone Encounter (Signed)
Last OV 12/05/17  [place] Controlled Substance Database checked. Last filled on 08/27/18

## 2018-05-02 ENCOUNTER — Ambulatory Visit (INDEPENDENT_AMBULATORY_CARE_PROVIDER_SITE_OTHER): Payer: Self-pay | Admitting: *Deleted

## 2018-05-02 DIAGNOSIS — J309 Allergic rhinitis, unspecified: Secondary | ICD-10-CM

## 2018-05-12 ENCOUNTER — Ambulatory Visit (INDEPENDENT_AMBULATORY_CARE_PROVIDER_SITE_OTHER): Payer: Self-pay

## 2018-05-12 DIAGNOSIS — J309 Allergic rhinitis, unspecified: Secondary | ICD-10-CM

## 2018-05-16 ENCOUNTER — Ambulatory Visit: Payer: Self-pay

## 2018-05-16 ENCOUNTER — Encounter: Payer: Self-pay | Admitting: Internal Medicine

## 2018-05-16 ENCOUNTER — Ambulatory Visit (INDEPENDENT_AMBULATORY_CARE_PROVIDER_SITE_OTHER): Payer: BLUE CROSS/BLUE SHIELD | Admitting: Internal Medicine

## 2018-05-16 VITALS — BP 110/72 | HR 88 | Temp 98.5°F

## 2018-05-16 DIAGNOSIS — L237 Allergic contact dermatitis due to plants, except food: Secondary | ICD-10-CM

## 2018-05-16 MED ORDER — METHYLPREDNISOLONE ACETATE 80 MG/ML IJ SUSP
80.0000 mg | Freq: Once | INTRAMUSCULAR | Status: AC
  Administered 2018-05-16: 80 mg via INTRAMUSCULAR

## 2018-05-16 NOTE — Addendum Note (Signed)
Addended by: Gwenyth Ober R on: 05/16/2018 04:26 PM   Modules accepted: Orders

## 2018-05-16 NOTE — Progress Notes (Signed)
Subjective:    Patient ID: Chelsea Dyer, female    DOB: 1966/01/18, 52 y.o.   MRN: [id number]  HPI  52 year old patient who presents with a one-week history of a very pruritic dermatitis involving the lower arms and also to a lesser extent face and anterior abdominal area.  She has been using some topical medicine with some benefit.  She is on chronic Zyrtec.  Rash occurred after doing yard work and suspected exposure to poison ivy  Past Medical History:  Diagnosis Date  . ALLERGIC RHINITIS 03/04/2008  . Anxiety   . DEPRESSION 03/04/2008  . Edema    pedal  . EXOGENOUS OBESITY 06/22/2010  . 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 Prolactinoma [facility])      Social History   Socioeconomic History  . Marital status: Married    Spouse name: Not on file  . Number of children: Not on file  . Years of education: Not on file  . Highest education level: Not on file  Occupational History  . Not on file  Social Needs  . Financial resource strain: Not on file  . Food insecurity:    Worry: Not on file    Inability: Not on file  . Transportation needs:    Medical: Not on file    Non-medical: Not on file  Tobacco Use  . Smoking status: Never Smoker  . Smokeless tobacco: Never Used  Substance and Sexual Activity  . Alcohol use: No    Alcohol/week: 0.0 standard drinks  . Drug use: No  . Sexual activity: Not Currently  Lifestyle  . Physical activity:    Days per week: Not on file    Minutes per session: Not on file  . Stress: Not on file  Relationships  . Social connections:    Talks on phone: Not on file    Gets together: Not on file    Attends religious service: Not on file    Active member of club or organization: Not on file    Attends meetings of clubs or organizations: Not on file    Relationship status: Not on file  . Intimate partner violence:    Fear of current or ex partner: Not on file    Emotionally abused: Not on file    Physically abused: Not on file    Forced  sexual activity: Not on file  Other Topics Concern  . Not on file  Social History Narrative  . Not on file    Past Surgical History:  Procedure Laterality Date  . BREAST SURGERY  2004   augmentation  . HAMMER TOE SURGERY    . LIPOSUCTION    . NASAL SINUS SURGERY      Family History  Problem Relation Age of Onset  . Arthritis Mother        osteo  . Allergic rhinitis Mother     Allergies  Allergen Reactions  . Percocet [Oxycodone-Acetaminophen] Other (See Comments)    Interfered with patient's antidepressants; made pt feel dizzy  . Septra [Sulfamethoxazole-Trimethoprim] Rash    Current Outpatient Medications on File Prior to Visit  Medication Sig Dispense Refill  . azelastine (ASTELIN) 0.1 % nasal spray Can use two sprays in each nostril twice daily as needed for itchy nose and sneezing. 30 mL 5  . Bepotastine Besilate (BEPREVE) 1.5 % SOLN Can use one drop in each eye twice a day for itchy eyes. 10 mL 5  . Black Cohosh-SoyIsoflav-Magnol (ESTROVEN MENOPAUSE RELIEF) CAPS Take 1 tablet by  mouth daily.    . cetirizine (ZYRTEC) 10 MG tablet Take 20 mg by mouth daily.     . Cyanocobalamin (VITAMIN B 12 PO) Take by mouth daily.    Marland Kitchen ELDERBERRY PO Take 2 tablets by mouth as needed.    . Flaxseed, Linseed, (EQL FLAX SEED OIL) 1000 MG CAPS Take 2 tablets by mouth daily.    . fluticasone (FLONASE) 50 MCG/ACT nasal spray Place 2 sprays into both nostrils daily. (Patient taking differently: Place 2 sprays into both nostrils daily. States using prn) 16 g 6  . ibuprofen (ADVIL) 200 MG tablet Take 800 mg by mouth every 8 (eight) hours as needed.    Marland Kitchen LORazepam (ATIVAN) 1 MG tablet TAKE ONE TABLET BY MOUTH TWICE A DAY AS NEEDED FOR ANXIETY 30 tablet 0  . montelukast (SINGULAIR) 10 MG tablet TAKE ONE TABLET BY MOUTH DAILY 30 tablet 0  . Multiple Vitamin (MULTI-VITAMINS) TABS Take by mouth.    . Probiotic Product (PROBIOTIC-10 PO) Take 1 tablet by mouth daily.    . Pyridoxine HCl (VITAMIN B-6  PO) Take by mouth daily.    . rizatriptan (MAXALT) 10 MG tablet TAKE 1 TABLET BY MOUTH AS NEEDED FOR MIGRAINES. MAY REPEAT IN 2 HOURS IF NEEDED 9 tablet 0  . sodium chloride (V-R NASAL SPRAY SALINE) 0.65 % nasal spray Place into the nose.    . SUMAtriptan (IMITREX) 100 MG tablet Take by mouth.    . TURMERIC PO Take 2 tablets by mouth daily.    Marland Kitchen venlafaxine XR (EFFEXOR-XR) 150 MG 24 hr capsule TAKE ONE CAPSULE BY MOUTH DAILY 90 capsule 0  . venlafaxine XR (EFFEXOR-XR) 75 MG 24 hr capsule TAKE ONE CAPSULE BY MOUTH DAILY 90 capsule 0   No current facility-administered medications on file prior to visit.     BP 110/72 (BP Location: Right Arm, Patient Position: Sitting, Cuff Size: Large)   Pulse 88   Temp 98.5 F (36.9 C) (Oral)   SpO2 98%     Review of Systems  Constitutional: Negative.   Skin: Positive for rash.       Objective:   Physical Exam  Constitutional: She appears well-developed and well-nourished. No distress.  Skin: Rash noted.  Dermatitis noted involving the lower arms with areas of excoriation and small vesicles.  She also has some involvement of the anterior abdominal wall and the left malar face area She had a nodular lesion involving the outer aspect of the proximal lower arm, approximately 7 mm in diameter with some telangiectasias          Assessment & Plan:   Contact dermatitis. Possible BCE right arm.  Patient does have an annual exam scheduled later this month will reassess at that time.  Likely will need dermatologic referral  Continue antihistamines and local topical therapy.  Treated with Depo-Medrol 80 IM  Marletta Lor

## 2018-05-16 NOTE — Patient Instructions (Signed)
Poison Ivy Dermatitis Poison ivy dermatitis is redness and soreness (inflammation) of the skin. It is caused by a chemical that is found on the leaves of the poison ivy plant. You may also have itching, a rash, and blisters. Symptoms often clear up in 1-2 weeks. You may get this condition by touching a poison ivy plant. You can also get it by touching something that has the chemical on it. This may include animals or objects that have come in contact with the plant. Follow these instructions at home: General instructions  Take or apply over-the-counter and prescription medicines only as told by your doctor.  If you touch poison ivy, wash your skin with soap and cold water right away.  Use hydrocortisone creams or calamine lotion as needed to help with itching.  Take oatmeal baths as needed. Use colloidal oatmeal. You can get this at a pharmacy or grocery store. Follow the instructions on the package.  Do not scratch or rub your skin.  While you have the rash, wash your clothes right after you wear them. Prevention  Know what poison ivy looks like so you can avoid it. This plant has three leaves with flowering branches on a single stem. The leaves are glossy. They have uneven edges that come to a point at the front.  If you have touched poison ivy, wash with soap and water right away. Be sure to wash under your fingernails.  When hiking or camping, wear long pants, a long-sleeved shirt, tall socks, and hiking boots. You can also use a lotion on your skin that helps to prevent contact with the chemical on the plant.  If you think that your clothes or outdoor gear came in contact with poison ivy, rinse them off with a garden hose before you bring them inside your house. Contact a doctor if:  You have open sores in the rash area.  You have more redness, swelling, or pain in the affected area.  You have redness that spreads beyond the rash area.  You have fluid, blood, or pus coming from  the affected area.  You have a fever.  You have a rash over a large area of your body.  You have a rash on your eyes, mouth, or genitals.  Your rash does not get better after a few days. Get help right away if:  Your face swells or your eyes swell shut.  You have trouble breathing.  You have trouble swallowing. This information is not intended to replace advice given to you by your health care provider. Make sure you discuss any questions you have with your health care provider. Document Released: 10/02/2010 Document Revised: 02/05/2016 Document Reviewed: 02/05/2015 Elsevier Interactive Patient Education  2018 [employer]  

## 2018-05-17 ENCOUNTER — Other Ambulatory Visit: Payer: Self-pay | Admitting: Internal Medicine

## 2018-05-26 ENCOUNTER — Ambulatory Visit (INDEPENDENT_AMBULATORY_CARE_PROVIDER_SITE_OTHER): Payer: Self-pay

## 2018-05-26 DIAGNOSIS — J309 Allergic rhinitis, unspecified: Secondary | ICD-10-CM

## 2018-05-30 ENCOUNTER — Other Ambulatory Visit: Payer: Self-pay | Admitting: Allergy

## 2018-05-31 ENCOUNTER — Encounter: Payer: Self-pay | Admitting: *Deleted

## 2018-05-31 NOTE — Progress Notes (Signed)
This encounter was created in error - please disregard.

## 2018-06-01 ENCOUNTER — Encounter: Payer: Self-pay | Admitting: Internal Medicine

## 2018-06-01 ENCOUNTER — Ambulatory Visit (INDEPENDENT_AMBULATORY_CARE_PROVIDER_SITE_OTHER): Payer: BLUE CROSS/BLUE SHIELD | Admitting: Internal Medicine

## 2018-06-01 VITALS — BP 120/78 | HR 83 | Temp 97.9°F | Wt 181.0 lb

## 2018-06-01 DIAGNOSIS — J301 Allergic rhinitis due to pollen: Secondary | ICD-10-CM

## 2018-06-01 DIAGNOSIS — N951 Menopausal and female climacteric states: Secondary | ICD-10-CM | POA: Insufficient documentation

## 2018-06-01 DIAGNOSIS — E221 Hyperprolactinemia: Secondary | ICD-10-CM | POA: Diagnosis not present

## 2018-06-01 DIAGNOSIS — Z8669 Personal history of other diseases of the nervous system and sense organs: Secondary | ICD-10-CM | POA: Diagnosis not present

## 2018-06-01 DIAGNOSIS — D352 Benign neoplasm of pituitary gland: Secondary | ICD-10-CM

## 2018-06-01 DIAGNOSIS — D353 Benign neoplasm of craniopharyngeal duct: Secondary | ICD-10-CM

## 2018-06-01 DIAGNOSIS — Z0000 Encounter for general adult medical examination without abnormal findings: Secondary | ICD-10-CM

## 2018-06-01 LAB — COMPREHENSIVE METABOLIC PANEL
ALBUMIN: 4.6 g/dL (ref 3.5–5.2)
ALT: 29 U/L (ref 0–35)
AST: 17 U/L (ref 0–37)
Alkaline Phosphatase: 83 U/L (ref 39–117)
BUN: 15 mg/dL (ref 6–23)
CALCIUM: 9.9 mg/dL (ref 8.4–10.5)
CHLORIDE: 105 meq/L (ref 96–112)
CO2: 27 mEq/L (ref 19–32)
CREATININE: 0.6 mg/dL (ref 0.40–1.20)
GFR: 111.57 mL/min (ref >60.00)
Glucose, Bld: 88 mg/dL (ref 70–99)
POTASSIUM: 4.7 meq/L (ref 3.5–5.1)
Sodium: 140 mEq/L (ref 135–145)
Total Bilirubin: 0.3 mg/dL (ref 0.2–1.2)
Total Protein: 7.1 g/dL (ref 6.0–8.3)

## 2018-06-01 LAB — LIPID PANEL
CHOLESTEROL: 257 mg/dL — AB (ref 0–200)
HDL: 63.9 mg/dL (ref >39.00)
LDL CALC: 154 mg/dL — AB (ref 0–99)
NonHDL: 192.76
TRIGLYCERIDES: 192 mg/dL — AB (ref 0.0–149.0)
Total CHOL/HDL Ratio: 4
VLDL: 38.4 mg/dL (ref 0.0–40.0)

## 2018-06-01 LAB — CBC WITH DIFFERENTIAL/PLATELET
BASOS PCT: 0.6 % (ref 0.0–3.0)
Basophils Absolute: 0 10*3/uL (ref 0.0–0.1)
EOS ABS: 0.4 10*3/uL (ref 0.0–0.7)
EOS PCT: 6.8 % — AB (ref 0.0–5.0)
HEMATOCRIT: 43.5 % (ref 36.0–46.0)
Hemoglobin: 14.8 g/dL (ref 12.0–15.0)
LYMPHS PCT: 26.8 % (ref 12.0–46.0)
Lymphs Abs: 1.6 10*3/uL (ref 0.7–4.0)
MCHC: 34 g/dL (ref 30.0–36.0)
MCV: 89.4 fl (ref 78.0–100.0)
Monocytes Absolute: 0.5 10*3/uL (ref 0.1–1.0)
Monocytes Relative: 8 % (ref 3.0–12.0)
Neutro Abs: 3.5 10*3/uL (ref 1.4–7.7)
Neutrophils Relative %: 57.8 % (ref 43.0–77.0)
Platelets: 298 10*3/uL (ref 150.0–400.0)
RBC: 4.87 Mil/uL (ref 3.87–5.11)
RDW: 13 % (ref 11.5–15.5)
WBC: 6 10*3/uL (ref 4.0–10.5)

## 2018-06-01 LAB — TSH: TSH: 2.58 u[IU]/mL (ref 0.35–4.50)

## 2018-06-01 MED ORDER — VENLAFAXINE HCL ER 75 MG PO CP24: 75.0000 mg | ORAL_CAPSULE | Freq: Every day | ORAL | 3 refills | Status: DC

## 2018-06-01 MED ORDER — RIZATRIPTAN BENZOATE 10 MG PO TABS: ORAL_TABLET | ORAL | 3 refills | Status: DC

## 2018-06-01 MED ORDER — LORAZEPAM 1 MG PO TABS: ORAL_TABLET | ORAL | 0 refills | Status: DC

## 2018-06-01 MED ORDER — VENLAFAXINE HCL ER 150 MG PO CP24: 150.0000 mg | ORAL_CAPSULE | Freq: Every day | ORAL | 3 refills | Status: DC

## 2018-06-01 NOTE — Progress Notes (Signed)
Subjective:    Patient ID: Chelsea Dyer, female    DOB: [birth year], 52 y.o.   MRN: [id number]  HPI  52 year old patient who is seen today for her six-month follow-up. She has a history of pituitary microadenoma with hyperprolactinemia. She is doing quite well today except for some menopausal symptoms. She is on Effexor for depression and is also tried black cohosh which was only temporarily helpful She has allergic rhinitis and is followed closely by allergy medicine.  Past Medical History:  Diagnosis Date  . ALLERGIC RHINITIS 03/04/2008  . Anxiety   . DEPRESSION 03/04/2008  . Edema    pedal  . EXOGENOUS OBESITY 06/22/2010  . 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 Prolactinoma [name])      Social History   Socioeconomic History  . Marital status: Married    Spouse name: Not on file  . Number of children: Not on file  . Years of education: Not on file  . Highest education level: Not on file  Occupational History  . Not on file  Social Needs  . Financial resource strain: Not on file  . Food insecurity:    Worry: Not on file    Inability: Not on file  . Transportation needs:    Medical: Not on file    Non-medical: Not on file  Tobacco Use  . Smoking status: Never Smoker  . Smokeless tobacco: Never Used  Substance and Sexual Activity  . Alcohol use: No    Alcohol/week: 0.0 standard drinks  . Drug use: No  . Sexual activity: Not Currently  Lifestyle  . Physical activity:    Days per week: Not on file    Minutes per session: Not on file  . Stress: Not on file  Relationships  . Social connections:    Talks on phone: Not on file    Gets together: Not on file    Attends religious service: Not on file    Active member of club or organization: Not on file    Attends meetings of clubs or organizations: Not on file    Relationship status: Not on file  . Intimate partner violence:    Fear of current or ex partner: Not on file    Emotionally abused: Not on file   Physically abused: Not on file    Forced sexual activity: Not on file  Other Topics Concern  . Not on file  Social History Narrative  . Not on file    Past Surgical History:  Procedure Laterality Date  . BREAST SURGERY  2004   augmentation  . HAMMER TOE SURGERY    . LIPOSUCTION    . NASAL SINUS SURGERY      Family History  Problem Relation Age of Onset  . Arthritis Mother        osteo  . Allergic rhinitis Mother     Allergies  Allergen Reactions  . Percocet [Oxycodone-Acetaminophen] Other (See Comments)    Interfered with patient's antidepressants; made pt feel dizzy  . Septra [Sulfamethoxazole-Trimethoprim] Rash    Current Outpatient Medications on File Prior to Visit  Medication Sig Dispense Refill  . APPLE CIDER VINEGAR PO Take 480 mg by mouth 3 (three) times daily.    Marland Kitchen azelastine (ASTELIN) 0.1 % nasal spray Can use two sprays in each nostril twice daily as needed for itchy nose and sneezing. 30 mL 5  . Bepotastine Besilate (BEPREVE) 1.5 % SOLN Can use one drop in each eye twice a day for itchy eyes.  10 mL 5  . Black Cohosh-SoyIsoflav-Magnol (ESTROVEN MENOPAUSE RELIEF) CAPS Take 1 tablet by mouth daily.    Marland Kitchen ELDERBERRY PO Take 2 tablets by mouth as needed.    . ferrous sulfate (IRON SUPPLEMENT) 325 (65 FE) MG tablet Take 325 mg by mouth daily with breakfast.    . Flaxseed, Linseed, (EQL FLAX SEED OIL) 1000 MG CAPS Take 2 tablets by mouth daily.    . fluticasone (FLONASE) 50 MCG/ACT nasal spray Place 2 sprays into both nostrils daily. (Patient taking differently: Place 2 sprays into both nostrils daily. States using prn) 16 g 6  . ibuprofen (ADVIL) 200 MG tablet Take 800 mg by mouth every 8 (eight) hours as needed.    Marland Kitchen levocetirizine (XYZAL) 5 MG tablet Take 5 mg by mouth every evening.    Marland Kitchen LEVOCETIRIZINE DIHYDROCHLORIDE PO Take 5 mg by mouth.    . montelukast (SINGULAIR) 10 MG tablet TAKE ONE TABLET BY MOUTH DAILY 30 tablet 0  . Multiple Vitamin (MULTI-VITAMINS)  TABS Take by mouth.    . Probiotic Product (PROBIOTIC-10 PO) Take 1 tablet by mouth daily.    . Pyridoxine HCl (VITAMIN B-6 PO) Take by mouth daily.    . sodium chloride (V-R NASAL SPRAY SALINE) 0.65 % nasal spray Place into the nose.    . SUMAtriptan (IMITREX) 100 MG tablet Take by mouth.    . TURMERIC PO Take 2 tablets by mouth daily.     No current facility-administered medications on file prior to visit.     BP 120/78 (BP Location: Right Arm, Patient Position: Sitting, Cuff Size: Large)   Pulse 83   Temp 97.9 F (36.6 C) (Oral)   Wt 181 lb (82.1 kg)   SpO2 97%   BMI 33.92 kg/m     Review of Systems  Constitutional: Negative.   HENT: Negative for congestion, dental problem, hearing loss, rhinorrhea, sinus pressure, sore throat and tinnitus.   Eyes: Negative for pain, discharge and visual disturbance.  Respiratory: Negative for cough and shortness of breath.   Cardiovascular: Negative for chest pain, palpitations and leg swelling.  Gastrointestinal: Negative for abdominal distention, abdominal pain, blood in stool, constipation, diarrhea, nausea and vomiting.  Endocrine: Positive for heat intolerance.  Genitourinary: Negative for difficulty urinating, dysuria, flank pain, frequency, hematuria, pelvic pain, urgency, vaginal bleeding, vaginal discharge and vaginal pain.  Musculoskeletal: Negative for arthralgias, gait problem and joint swelling.  Skin: Negative for rash.  Neurological: Negative for dizziness, syncope, speech difficulty, weakness, numbness and headaches.  Hematological: Negative for adenopathy.  Psychiatric/Behavioral: Negative for agitation, behavioral problems and dysphoric mood. The patient is nervous/anxious.        Objective:   Physical Exam  Constitutional: She is oriented to person, place, and time. She appears well-developed and well-nourished.  HENT:  Head: Normocephalic.  Right Ear: External ear normal.  Left Ear: External ear normal.    Mouth/Throat: Oropharynx is clear and moist.  Eyes: Pupils are equal, round, and reactive to light. Conjunctivae and EOM are normal.  Neck: Normal range of motion. Neck supple. No thyromegaly present.  Cardiovascular: Normal rate, regular rhythm, normal heart sounds and intact distal pulses.  Pulmonary/Chest: Effort normal and breath sounds normal.  Abdominal: Soft. Bowel sounds are normal. She exhibits no mass. There is no tenderness.  Musculoskeletal: Normal range of motion.  Lymphadenopathy:    She has no cervical adenopathy.  Neurological: She is alert and oriented to person, place, and time.  Skin: Skin is warm and dry. No  rash noted.  Psychiatric: She has a normal mood and affect. Her behavior is normal.          Assessment & Plan:   Menopausal syndrome. Depression.  Effexor refilled Hyperprolactinemia.  Will check a follow-up prolactin level  allergic rhinitis.  Follow-up allergy medicine  Marletta Lor

## 2018-06-01 NOTE — Patient Instructions (Signed)
It is important that you exercise regularly, at least 20 minutes 3 to 4 times per week.  If you develop chest pain or shortness of breath seek  medical attention.  Allergy follow-up as scheduled  Return in 3-6 months for follow-up

## 2018-06-02 LAB — PROLACTIN: Prolactin: 15.2 ng/mL

## 2018-06-06 ENCOUNTER — Ambulatory Visit (INDEPENDENT_AMBULATORY_CARE_PROVIDER_SITE_OTHER): Payer: Self-pay | Admitting: *Deleted

## 2018-06-06 DIAGNOSIS — J309 Allergic rhinitis, unspecified: Secondary | ICD-10-CM

## 2018-06-08 ENCOUNTER — Telehealth: Payer: Self-pay | Admitting: Internal Medicine

## 2018-06-08 NOTE — Progress Notes (Signed)
Vials exp 06-10-19 

## 2018-06-08 NOTE — Telephone Encounter (Signed)
Copied from [place] [id number]. Topic: General - Other >> Jun 08, 2018 10:39 AM Marin Olp L wrote: Reason for CRM: Patient calling for lab results

## 2018-06-09 DIAGNOSIS — J3089 Other allergic rhinitis: Secondary | ICD-10-CM

## 2018-06-09 NOTE — Telephone Encounter (Signed)
Pt will be given lab results Monday when Dr.Todd comes in.

## 2018-06-12 NOTE — Telephone Encounter (Signed)
Chelsea Dyer please call Chelsea Dyer........... all her labs including her prolactin level were normal

## 2018-06-12 NOTE — Telephone Encounter (Signed)
Pt notified via Vm. 

## 2018-06-29 ENCOUNTER — Ambulatory Visit (INDEPENDENT_AMBULATORY_CARE_PROVIDER_SITE_OTHER): Payer: Self-pay | Admitting: *Deleted

## 2018-06-29 DIAGNOSIS — J309 Allergic rhinitis, unspecified: Secondary | ICD-10-CM

## 2018-07-06 ENCOUNTER — Ambulatory Visit (INDEPENDENT_AMBULATORY_CARE_PROVIDER_SITE_OTHER): Payer: Self-pay

## 2018-07-06 DIAGNOSIS — J309 Allergic rhinitis, unspecified: Secondary | ICD-10-CM

## 2018-07-11 ENCOUNTER — Ambulatory Visit (INDEPENDENT_AMBULATORY_CARE_PROVIDER_SITE_OTHER): Payer: Self-pay | Admitting: *Deleted

## 2018-07-11 DIAGNOSIS — J309 Allergic rhinitis, unspecified: Secondary | ICD-10-CM

## 2018-07-20 ENCOUNTER — Ambulatory Visit (INDEPENDENT_AMBULATORY_CARE_PROVIDER_SITE_OTHER): Payer: Self-pay | Admitting: *Deleted

## 2018-07-20 DIAGNOSIS — J309 Allergic rhinitis, unspecified: Secondary | ICD-10-CM

## 2018-07-27 ENCOUNTER — Ambulatory Visit (INDEPENDENT_AMBULATORY_CARE_PROVIDER_SITE_OTHER): Payer: Self-pay | Admitting: *Deleted

## 2018-07-27 DIAGNOSIS — J309 Allergic rhinitis, unspecified: Secondary | ICD-10-CM

## 2018-08-03 ENCOUNTER — Other Ambulatory Visit: Payer: Self-pay | Admitting: Internal Medicine

## 2018-08-24 ENCOUNTER — Encounter: Payer: Self-pay | Admitting: Internal Medicine

## 2018-08-24 ENCOUNTER — Ambulatory Visit (INDEPENDENT_AMBULATORY_CARE_PROVIDER_SITE_OTHER): Payer: Self-pay | Admitting: Internal Medicine

## 2018-08-24 VITALS — BP 102/68 | HR 80 | Temp 97.5°F | Ht 62.0 in | Wt 183.5 lb

## 2018-08-24 DIAGNOSIS — F339 Major depressive disorder, recurrent, unspecified: Secondary | ICD-10-CM

## 2018-08-24 DIAGNOSIS — D353 Benign neoplasm of craniopharyngeal duct: Secondary | ICD-10-CM

## 2018-08-24 DIAGNOSIS — D352 Benign neoplasm of pituitary gland: Secondary | ICD-10-CM

## 2018-08-24 DIAGNOSIS — Z8669 Personal history of other diseases of the nervous system and sense organs: Secondary | ICD-10-CM

## 2018-08-24 DIAGNOSIS — N951 Menopausal and female climacteric states: Secondary | ICD-10-CM

## 2018-08-24 DIAGNOSIS — F419 Anxiety disorder, unspecified: Secondary | ICD-10-CM

## 2018-08-24 MED ORDER — AZELASTINE HCL 0.1 % NA SOLN: NASAL | 2 refills | Status: DC

## 2018-08-24 MED ORDER — VENLAFAXINE HCL ER 75 MG PO CP24: 75.0000 mg | ORAL_CAPSULE | Freq: Every day | ORAL | 3 refills | Status: DC

## 2018-08-24 MED ORDER — RIZATRIPTAN BENZOATE 10 MG PO TABS: ORAL_TABLET | ORAL | 3 refills | Status: DC

## 2018-08-24 MED ORDER — LEVOCETIRIZINE DIHYDROCHLORIDE 5 MG PO TABS: 5.0000 mg | ORAL_TABLET | Freq: Every evening | ORAL | 1 refills | Status: DC

## 2018-08-24 MED ORDER — LORAZEPAM 0.5 MG PO TABS: ORAL_TABLET | ORAL | 0 refills | Status: DC

## 2018-08-24 MED ORDER — VENLAFAXINE HCL ER 150 MG PO CP24: 150.0000 mg | ORAL_CAPSULE | Freq: Every day | ORAL | 1 refills | Status: DC

## 2018-08-24 MED ORDER — FLUTICASONE PROPIONATE 50 MCG/ACT NA SUSP: 2.0000 | Freq: Every day | NASAL | 2 refills | Status: AC

## 2018-08-24 NOTE — Progress Notes (Signed)
Established Patient Office Visit     CC/Reason for Visit: Establish care, medication refills, follow-up on chronic medical conditions  HPI: Chelsea Dyer is a 52 y.o. female who is coming in today for the above mentioned reasons.  Due for annual physical exam in September 2020.  Past Medical History is significant for: Depression, very tearful mood today for loss of social stressors, on high dose of Effexor and Ativan.  She uses Ativan very infrequently, she probably takes 1 tablet/month towards the end of the day when things get too overwhelming.  She states that sometimes she takes half or quarter because it "knocks her out".  Also history of migraine headaches for which she takes Maxalt as needed, she has allergies takes xyzal, Flonase.  Gets weekly allergy shots with her allergist.  She also has a history of a microadenoma of the pituitary in the past with hyperprolactinemia was on bromocriptine for years but was taken off 3 to 4 years ago, last prolactin level in September was normal.  She does not follow with endocrinology for this issue.  She has no acute issues.  When asked to discuss a little bit more about her depression she becomes very tearful describing all the social stressors including issues with her husband, daughter with Down syndrome, a sick mother who is currently living with her.    Past Medical/Surgical History: Past Medical History:  Diagnosis Date  . ALLERGIC RHINITIS 03/04/2008  . Anxiety   . DEPRESSION 03/04/2008  . Edema    pedal  . EXOGENOUS OBESITY 06/22/2010  . 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 Prolactinoma [facility])     Past Surgical History:  Procedure Laterality Date  . BREAST SURGERY  2004   augmentation  . HAMMER TOE SURGERY    . LIPOSUCTION    . NASAL SINUS SURGERY      Social History:  reports that she has never smoked. She has never used smokeless tobacco. She reports that she does not drink alcohol or use drugs.  Allergies: Allergies    Allergen Reactions  . Percocet [Oxycodone-Acetaminophen] Other (See Comments)    Interfered with patient's antidepressants; made pt feel dizzy  . Septra [Sulfamethoxazole-Trimethoprim] Rash    Family History:  Family History  Problem Relation Age of Onset  . Arthritis Mother        osteo  . Allergic rhinitis Mother      Current Outpatient Medications:  .  azelastine (ASTELIN) 0.1 % nasal spray, Can use two sprays in each nostril twice daily as needed for itchy nose and sneezing., Disp: 30 mL, Rfl: 2 .  Bepotastine Besilate (BEPREVE) 1.5 % SOLN, Can use one drop in each eye twice a day for itchy eyes., Disp: 10 mL, Rfl: 5 .  Black Cohosh-SoyIsoflav-Magnol (ESTROVEN MENOPAUSE RELIEF) CAPS, Take 1 tablet by mouth daily., Disp: , Rfl:  .  ELDERBERRY PO, Take 2 tablets by mouth as needed., Disp: , Rfl:  .  ferrous sulfate (IRON SUPPLEMENT) 325 (65 FE) MG tablet, Take 325 mg by mouth daily with breakfast., Disp: , Rfl:  .  fluticasone (FLONASE) 50 MCG/ACT nasal spray, Place 2 sprays into both nostrils daily., Disp: 18.2 g, Rfl: 2 .  ibuprofen (ADVIL) 200 MG tablet, Take 800 mg by mouth every 8 (eight) hours as needed., Disp: , Rfl:  .  LORazepam (ATIVAN) 0.5 MG tablet, TAKE ONE TABLET BY MOUTH TWICE A DAY AS NEEDED FOR ANXIETY, Disp: 60 tablet, Rfl: 0 .  Multiple Vitamin (MULTI-VITAMINS) TABS,  Take by mouth., Disp: , Rfl:  .  Probiotic Product (PROBIOTIC-10 PO), Take 1 tablet by mouth daily., Disp: , Rfl:  .  Pyridoxine HCl (VITAMIN B-6 PO), Take by mouth daily., Disp: , Rfl:  .  rizatriptan (MAXALT) 10 MG tablet, TAKE 1 TABLET BY MOUTH AS NEEDED FOR MIGRAINES. MAY REPEAT IN 2 HOURS IF NEEDED, Disp: 9 tablet, Rfl: 3 .  TURMERIC PO, Take 2 tablets by mouth daily., Disp: , Rfl:  .  venlafaxine XR (EFFEXOR-XR) 150 MG 24 hr capsule, Take 1 capsule (150 mg total) by mouth daily., Disp: 90 capsule, Rfl: 1 .  venlafaxine XR (EFFEXOR-XR) 75 MG 24 hr capsule, Take 1 capsule (75 mg total) by mouth  daily., Disp: 90 capsule, Rfl: 3 .  levocetirizine (XYZAL) 5 MG tablet, Take 1 tablet (5 mg total) by mouth every evening., Disp: 90 tablet, Rfl: 1  Review of Systems:  Constitutional: Denies fever, chills, diaphoresis, appetite change and fatigue.  HEENT: Denies photophobia, eye pain, redness, hearing loss, ear pain, congestion, sore throat, rhinorrhea, sneezing, mouth sores, trouble swallowing, neck pain, neck stiffness and tinnitus.   Respiratory: Denies SOB, DOE, cough, chest tightness,  and wheezing.   Cardiovascular: Denies chest pain, palpitations and leg swelling.  Gastrointestinal: Denies nausea, vomiting, abdominal pain, diarrhea, constipation, blood in stool and abdominal distention.  Genitourinary: Denies dysuria, urgency, frequency, hematuria, flank pain and difficulty urinating.  Endocrine: Denies: hot or cold intolerance, sweats, changes in hair or nails, polyuria, polydipsia. Musculoskeletal: Denies myalgias, back pain, joint swelling, arthralgias and gait problem.  Skin: Denies pallor, rash and wound.  Neurological: Denies dizziness, seizures, syncope, weakness, light-headedness, numbness and headaches.  Hematological: Denies adenopathy. Easy bruising, personal or family bleeding history  Psychiatric/Behavioral: Denies suicidal ideation, mood changes, confusion, nervousness, sleep disturbance and agitation    Physical Exam: Vitals:   08/24/18 0743  BP: 102/68  Pulse: 80  Temp: (!) 97.5 F (36.4 C)  TempSrc: Oral  SpO2: 96%  Weight: 183 lb 8 oz (83.2 kg)  Height: 5\' 2"  (1.575 m)    Body mass index is 33.56 kg/m.   Constitutional: NAD, calm, comfortable, tearful Eyes: PERRL, lids and conjunctivae normal ENMT: Mucous membranes are moist. Neck: normal, supple, no masses, no thyromegaly Respiratory: clear to auscultation bilaterally, no wheezing, no crackles. Normal respiratory effort. No accessory muscle use.  Cardiovascular: Regular rate and rhythm, no murmurs /  rubs / gallops. No extremity edema. 2+ pedal pulses. No carotid bruits.  Abdomen: no tenderness, no masses palpated. No hepatosplenomegaly. Bowel sounds positive.  Musculoskeletal: no clubbing / cyanosis. No joint deformity upper and lower extremities. Good ROM, no contractures. Normal muscle tone.  Skin: no rashes, lesions, ulcers. No induration Neurologic: Grossly intact and nonfocal Psychiatric: Normal judgment and insight. Alert and oriented x 3. Normal mood.    Impression and Plan:  PITUITARY MICROADENOMA -Stable, off bromocriptine, last prolactin level was normal, does not see endocrinology at this point.  Depression, recurrent ([place])  -This remains her main issue, she is quite tearful during today's visit describing all the social stressors that she is currently under.  She has no apparent social outlet, friends to talk to. -She feels guilty and "has no time" to take time to care of herself. -Have highly recommended that she see a counselor, Dennison Bulla is in our office and have strongly recommended that she make an appointment with him before leaving today.  Menopausal syndrome (hot flashes) -Nothing really helps with this, she has tried Effexor, black cohosh.  History of migraine headaches  -Will receive a refill of her triptan today.  Anxiety -She uses Ativan very infrequently maybe once or twice a month. -She states that she frequently has to cut the dose in half or in fourths because it knocks her out. -Will decrease dose to 0.5 mg and limit her prescription to 60 tablets.  This should last her a good while.    Patient Instructions  -It was nice meeting you today!  -Please make sure you make an appointment with our onsite counselor Dennison Bulla.  -Please arrange a follow-up with me in 6 months.     Lelon Frohlich, MD [place] [facility]

## 2018-08-24 NOTE — Patient Instructions (Signed)
-  It was nice meeting you today!  -Please make sure you make an appointment with our onsite counselor Dennison Bulla.  -Please arrange a follow-up with me in 6 months.

## 2018-09-07 ENCOUNTER — Encounter: Payer: Self-pay | Admitting: Internal Medicine

## 2018-09-07 ENCOUNTER — Ambulatory Visit (INDEPENDENT_AMBULATORY_CARE_PROVIDER_SITE_OTHER): Payer: Self-pay | Admitting: Internal Medicine

## 2018-09-07 ENCOUNTER — Other Ambulatory Visit (INDEPENDENT_AMBULATORY_CARE_PROVIDER_SITE_OTHER): Payer: Self-pay

## 2018-09-07 VITALS — BP 120/78 | HR 86 | Temp 97.9°F | Ht 62.0 in | Wt 177.0 lb

## 2018-09-07 DIAGNOSIS — R112 Nausea with vomiting, unspecified: Secondary | ICD-10-CM

## 2018-09-07 DIAGNOSIS — R6889 Other general symptoms and signs: Secondary | ICD-10-CM

## 2018-09-07 DIAGNOSIS — Z8669 Personal history of other diseases of the nervous system and sense organs: Secondary | ICD-10-CM

## 2018-09-07 DIAGNOSIS — R197 Diarrhea, unspecified: Secondary | ICD-10-CM

## 2018-09-07 LAB — COMPREHENSIVE METABOLIC PANEL
ALT: 33 U/L (ref 0–35)
AST: 16 U/L (ref 0–37)
Albumin: 4 g/dL (ref 3.5–5.2)
Alkaline Phosphatase: 75 U/L (ref 39–117)
BUN: 8 mg/dL (ref 6–23)
CHLORIDE: 104 meq/L (ref 96–112)
CO2: 27 mEq/L (ref 19–32)
Calcium: 9.2 mg/dL (ref 8.4–10.5)
Creatinine, Ser: 0.57 mg/dL (ref 0.40–1.20)
GFR: 118.25 mL/min (ref >60.00)
Glucose, Bld: 112 mg/dL — ABNORMAL HIGH (ref 70–99)
POTASSIUM: 4 meq/L (ref 3.5–5.1)
Sodium: 139 mEq/L (ref 135–145)
Total Bilirubin: 0.3 mg/dL (ref 0.2–1.2)
Total Protein: 6.9 g/dL (ref 6.0–8.3)

## 2018-09-07 LAB — CBC
HCT: 42.4 % (ref 36.0–46.0)
HEMOGLOBIN: 14.4 g/dL (ref 12.0–15.0)
MCHC: 34.1 g/dL (ref 30.0–36.0)
MCV: 90.4 fl (ref 78.0–100.0)
Platelets: 296 10*3/uL (ref 150.0–400.0)
RBC: 4.69 Mil/uL (ref 3.87–5.11)
RDW: 13.1 % (ref 11.5–15.5)
WBC: 8.8 10*3/uL (ref 4.0–10.5)

## 2018-09-07 LAB — LIPASE: Lipase: 8 U/L — ABNORMAL LOW (ref 11.0–59.0)

## 2018-09-07 LAB — POC INFLUENZA A&B (BINAX/QUICKVUE)
Influenza A, POC: NEGATIVE
Influenza B, POC: NEGATIVE

## 2018-09-07 MED ORDER — ONDANSETRON HCL 4 MG PO TABS: 4.0000 mg | ORAL_TABLET | Freq: Three times a day (TID) | ORAL | 0 refills | Status: DC | PRN

## 2018-09-07 MED ORDER — METHYLPREDNISOLONE ACETATE 40 MG/ML IJ SUSP
40.0000 mg | Freq: Once | INTRAMUSCULAR | Status: AC
  Administered 2018-09-07: 40 mg via INTRAMUSCULAR

## 2018-09-07 MED ORDER — KETOROLAC TROMETHAMINE 30 MG/ML IJ SOLN
30.0000 mg | Freq: Once | INTRAMUSCULAR | Status: AC
  Administered 2018-09-07: 30 mg via INTRAMUSCULAR

## 2018-09-07 MED ORDER — RIZATRIPTAN BENZOATE 10 MG PO TABS: ORAL_TABLET | ORAL | 0 refills | Status: DC

## 2018-09-07 MED ORDER — ONDANSETRON 4 MG PO TBDP
4.0000 mg | ORAL_TABLET | Freq: Once | ORAL | Status: AC
  Administered 2018-09-07: 4 mg via ORAL

## 2018-09-07 NOTE — Assessment & Plan Note (Signed)
POC flu test done in office for headaches and chills which was negative.

## 2018-09-07 NOTE — Progress Notes (Signed)
   Subjective:   Patient ID: Chelsea Dyer, female    DOB: 1966-04-23, 52 y.o.   MRN: [id number]  HPI The patient is a 52 YO female coming in for 3 days history of migraine (has migraines sometimes but this is terrible, she took maxalt Monday when it started and this did not help so then she took 2 which did not help, still having migraine, is out of maxalt due to taking it all this week, some light and sound sensitivity, today nausea and vomiting) and body aches (started Monday with everything else, is using hot baths to help with this) and diarrhea (going on since Monday, endorses >10 episodes per day, mostly water and small pieces of stool, denies blood in stool, denies sick contacts, states is not eating well lately and eating a lot of junk food due to stress, put dog down about 2 weeks ago, under a lot of stress, until today had been drinking plenty of fluids and eating crackers and jello without nausea or vomiting, still urinating okay) and vomiting (started today, is nauseous as well, vomited twice today already, tried some ginger ale and water which did not stay down, denies blood in vomit, still nauseous).   PMH, [facility], social history reviewed and updated, allergies reviewed  Review of Systems  Constitutional: Positive for activity change, appetite change and fatigue. Negative for chills, diaphoresis, fever and unexpected weight change.  HENT: Positive for congestion, postnasal drip, rhinorrhea and sinus pressure.   Eyes: Negative.   Respiratory: Negative for cough, chest tightness and shortness of breath.   Cardiovascular: Negative for chest pain, palpitations and leg swelling.  Gastrointestinal: Positive for abdominal pain, diarrhea, nausea and vomiting. Negative for abdominal distention, anal bleeding, blood in stool, constipation and rectal pain.  Musculoskeletal: Negative.   Skin: Negative.   Neurological: Positive for weakness, light-headedness and headaches. Negative for dizziness,  tremors, seizures, syncope, speech difficulty and numbness.  Psychiatric/Behavioral: Negative.     Objective:  Physical Exam Constitutional:      Appearance: She is well-developed. She is ill-appearing.  HENT:     Head: Normocephalic and atraumatic.  Neck:     Musculoskeletal: Normal range of motion.  Cardiovascular:     Rate and Rhythm: Normal rate and regular rhythm.  Pulmonary:     Effort: Pulmonary effort is normal. No respiratory distress.     Breath sounds: Normal breath sounds. No wheezing or rales.  Abdominal:     General: Bowel sounds are normal. There is no distension.     Palpations: Abdomen is soft.     Tenderness: There is abdominal tenderness. There is no rebound.     Comments: Mild to moderate tenderness diffuse without rebound or guarding  Musculoskeletal:        General: No swelling or tenderness.  Skin:    General: Skin is warm and dry.  Neurological:     Mental Status: She is alert and oriented to person, place, and time.     Coordination: Coordination abnormal.     Comments: Slow gait     Vitals:   09/07/18 1012  BP: 120/78  Pulse: 86  Temp: 97.9 F (36.6 C)  TempSrc: Oral  SpO2: 96%  Weight: 177 lb (80.3 kg)  Height: 5\' 2"  (1.575 m)    Assessment & Plan:  Zofran 4 mg ODT given at visit, toradol 30 mg IM and depo-medrol 40 mg IM given at visit

## 2018-09-07 NOTE — Assessment & Plan Note (Signed)
Checking CBC, CMP, lipase to rule out causes and dehydration. She is maintaining fluids except today and given nausea medicine to help her tolerate fluids. Exam non-specific. Can try imodium for diarrhea otc. Encouraged to push fluids.

## 2018-09-07 NOTE — Assessment & Plan Note (Signed)
Could be related to migraine ongoing. Rx for zofran and zoran odt given at visit for severe nausea. Checking CBC, CMP, lipase to rule out alternate causes.

## 2018-09-07 NOTE — Patient Instructions (Addendum)
We have given you two shots today which should help with the migraine to stop this.   You got a flu test today which was negative.   We are checking some blood work to see if there is a cause for the diarrhea and nausea.   Keep working on pushing fluids. We have sent in a medicine for nausea which you can take every 4 hours as needed.   It is okay to try some imodium for the diarrhea. Call us back if this is not improving in the next 3-4 days.   We have sent in refill of the migraine medicine.    Food Choices to Help Relieve Diarrhea, Adult When you have diarrhea, the foods you eat and your eating habits are very important. Choosing the right foods and drinks can help:  Relieve diarrhea.  Replace lost fluids and nutrients.  Prevent dehydration. What general guidelines should I follow?  Relieving diarrhea  Choose foods with less than 2 g or .07 oz. of fiber per serving.  Limit fats to less than 8 tsp (38 g or 1.34 oz.) a day.  Avoid the following: ? Foods and beverages sweetened with high-fructose corn syrup, honey, or sugar alcohols such as xylitol, sorbitol, and mannitol. ? Foods that contain a lot of fat or sugar. ? Fried, greasy, or spicy foods. ? High-fiber grains, breads, and cereals. ? Raw fruits and vegetables.  Eat foods that are rich in probiotics. These foods include dairy products such as yogurt and fermented milk products. They help increase healthy bacteria in the stomach and intestines (gastrointestinal tract, or GI tract).  If you have lactose intolerance, avoid dairy products. These may make your diarrhea worse.  Take medicine to help stop diarrhea (antidiarrheal medicine) only as told by your health care provider. Replacing nutrients  Eat small meals or snacks every 3-4 hours.  Eat bland foods, such as white rice, toast, or baked potato, until your diarrhea starts to get better. Gradually reintroduce nutrient-rich foods as tolerated or as told by your  health care provider. This includes: ? Well-cooked protein foods. ? Peeled, seeded, and soft-cooked fruits and vegetables. ? Low-fat dairy products.  Take vitamin and mineral supplements as told by your health care provider. Preventing dehydration  Start by sipping water or a special solution to prevent dehydration (oral rehydration solution, ORS). Urine that is clear or pale yellow means that you are getting enough fluid.  Try to drink at least 8-10 cups of fluid each day to help replace lost fluids.  You may add other liquids in addition to water, such as clear juice or decaffeinated sports drinks, as tolerated or as told by your health care provider.  Avoid drinks with caffeine, such as coffee, tea, or soft drinks.  Avoid alcohol. What foods are recommended?     The items listed may not be a complete list. Talk with your health care provider about what dietary choices are best for you. Grains White rice. White, [place], or pita breads (fresh or toasted), including plain rolls, buns, or bagels. White pasta. Saltine, soda, or graham crackers. Pretzels. Low-fiber cereal. Cooked cereals made with water (such as cornmeal, farina, or cream cereals). Plain muffins. Matzo. Melba toast. Zwieback. Vegetables Potatoes (without the skin). Most well-cooked and canned vegetables without skins or seeds. Tender lettuce. Fruits Apple sauce. Fruits canned in juice. Cooked apricots, cherries, grapefruit, peaches, pears, or plums. Fresh bananas and cantaloupe. Meats and other protein foods Baked or boiled chicken. Eggs. Tofu. Fish. Seafood.  Smooth nut butters. Ground or well-cooked tender beef, ham, veal, lamb, pork, or poultry. Dairy Plain yogurt, kefir, and unsweetened liquid yogurt. Lactose-free milk, buttermilk, skim milk, or soy milk. Low-fat or nonfat hard cheese. Beverages Water. Low-calorie sports drinks. Fruit juices without pulp. Strained tomato and vegetable juices. Decaffeinated teas.  Sugar-free beverages not sweetened with sugar alcohols. Oral rehydration solutions, if approved by your health care provider. Seasoning and other foods Bouillon, broth, or soups made from recommended foods. What foods are not recommended? The items listed may not be a complete list. Talk with your health care provider about what dietary choices are best for you. Grains Whole grain, whole wheat, bran, or rye breads, rolls, pastas, and crackers. Wild or brown rice. Whole grain or bran cereals. Barley. Oats and oatmeal. Corn tortillas or taco shells. Granola. Popcorn. Vegetables Raw vegetables. Fried vegetables. Cabbage, broccoli, Brussels sprouts, artichokes, baked beans, beet greens, corn, kale, legumes, peas, sweet potatoes, and yams. Potato skins. Cooked spinach and cabbage. Fruits Dried fruit, including raisins and dates. Raw fruits. Stewed or dried prunes. Canned fruits with syrup. Meat and other protein foods Fried or fatty meats. Deli meats. Chunky nut butters. Nuts and seeds. Beans and lentils. Berniece Salines. Hot dogs. Sausage. Dairy High-fat cheeses. Whole milk, chocolate milk, and beverages made with milk, such as milk shakes. Half-and-half. Cream. sour cream. Ice cream. Beverages Caffeinated beverages (such as coffee, tea, soda, or energy drinks). Alcoholic beverages. Fruit juices with pulp. Prune juice. Soft drinks sweetened with high-fructose corn syrup or sugar alcohols. High-calorie sports drinks. Fats and oils Butter. Cream sauces. Margarine. Salad oils. Plain salad dressings. Olives. Avocados. Mayonnaise. Sweets and desserts Sweet rolls, doughnuts, and sweet breads. Sugar-free desserts sweetened with sugar alcohols such as xylitol and sorbitol. Seasoning and other foods Honey. Hot sauce. Chili powder. Gravy. Cream-based or milk-based soups. Pancakes and waffles. Summary  When you have diarrhea, the foods you eat and your eating habits are very important.  Make sure you get at least  8-10 cups of fluid each day, or enough to keep your urine clear or pale yellow.  Eat bland foods and gradually reintroduce healthy, nutrient-rich foods as tolerated, or as told by your health care provider.  Avoid high-fiber, fried, greasy, or spicy foods. This information is not intended to replace advice given to you by your health care provider. Make sure you discuss any questions you have with your health care provider. Document Released: 11/20/2003 Document Revised: 08/27/2016 Document Reviewed: 08/27/2016 Elsevier Interactive Patient Education  2019 [employer].

## 2018-09-07 NOTE — Addendum Note (Signed)
Addended by: Raford Pitcher R on: 09/07/2018 11:14 AM   Modules accepted: Orders

## 2018-09-07 NOTE — Assessment & Plan Note (Signed)
Given toradol 30 mg IM and depo-medrol 40 mg IM to help break migraine. Unclear if nausea and vomiting is related. Diarrhea is likely not related. Refill on maxalt today.

## 2018-09-08 ENCOUNTER — Ambulatory Visit (INDEPENDENT_AMBULATORY_CARE_PROVIDER_SITE_OTHER): Payer: Self-pay | Admitting: Family Medicine

## 2018-09-08 ENCOUNTER — Encounter: Payer: Self-pay | Admitting: Family Medicine

## 2018-09-08 VITALS — BP 120/80 | HR 84 | Temp 97.8°F | Wt 179.1 lb

## 2018-09-08 DIAGNOSIS — J019 Acute sinusitis, unspecified: Secondary | ICD-10-CM

## 2018-09-08 MED ORDER — AZITHROMYCIN 250 MG PO TABS: ORAL_TABLET | ORAL | 0 refills | Status: DC

## 2018-09-08 NOTE — Progress Notes (Signed)
   Subjective:    Patient ID: Chelsea Dyer, female    DOB: [birth year], 52 y.o.   MRN: [id number]  HPI Here for several days of sinus congestion, PND, and a dry cough. No fever.    Review of Systems  Constitutional: Negative.   HENT: Positive for congestion, postnasal drip, sinus pressure and sinus pain. Negative for sore throat.   Eyes: Negative.   Respiratory: Positive for cough.        Objective:   Physical Exam Constitutional:      Appearance: Normal appearance.  HENT:     Right Ear: Tympanic membrane and ear canal normal.     Left Ear: Tympanic membrane and ear canal normal.     Nose: Nose normal.     Mouth/Throat:     Pharynx: Oropharynx is clear.  Eyes:     Conjunctiva/sclera: Conjunctivae normal.  Pulmonary:     Effort: Pulmonary effort is normal.     Breath sounds: Normal breath sounds.  Lymphadenopathy:     Cervical: No cervical adenopathy.  Neurological:     Mental Status: She is alert.           Assessment & Plan:  Sinusitis, treat with a Zpack.  Alysia Penna, MD

## 2018-09-11 ENCOUNTER — Telehealth: Payer: Self-pay

## 2018-09-11 NOTE — Telephone Encounter (Signed)
Copied from [place] [id number]. Topic: General - Other >> Sep 08, 2018  8:52 AM Carolyn Stare wrote:  Pt said she think she has a sinus infection and is asking if something can be called in.  She saw Dr Sharlet Salina at [facility] on 09/07/18 for something else      Pharmacy   Ammie Ferrier

## 2018-09-14 ENCOUNTER — Ambulatory Visit (INDEPENDENT_AMBULATORY_CARE_PROVIDER_SITE_OTHER): Payer: Self-pay | Admitting: Family Medicine

## 2018-09-14 ENCOUNTER — Other Ambulatory Visit (INDEPENDENT_AMBULATORY_CARE_PROVIDER_SITE_OTHER): Payer: Self-pay

## 2018-09-14 ENCOUNTER — Encounter: Payer: Self-pay | Admitting: Family Medicine

## 2018-09-14 ENCOUNTER — Encounter: Payer: Self-pay | Admitting: Emergency Medicine

## 2018-09-14 VITALS — BP 106/74 | HR 93 | Temp 98.0°F | Resp 16 | Wt 181.0 lb

## 2018-09-14 DIAGNOSIS — R197 Diarrhea, unspecified: Secondary | ICD-10-CM

## 2018-09-14 DIAGNOSIS — R6889 Other general symptoms and signs: Secondary | ICD-10-CM

## 2018-09-14 DIAGNOSIS — R110 Nausea: Secondary | ICD-10-CM

## 2018-09-14 DIAGNOSIS — Z8669 Personal history of other diseases of the nervous system and sense organs: Secondary | ICD-10-CM

## 2018-09-14 LAB — CBC WITH DIFFERENTIAL/PLATELET
Basophils Absolute: 0 10*3/uL (ref 0.0–0.1)
Basophils Relative: 0.4 % (ref 0.0–3.0)
Eosinophils Absolute: 0.1 10*3/uL (ref 0.0–0.7)
Eosinophils Relative: 2.5 % (ref 0.0–5.0)
HCT: 40.4 % (ref 36.0–46.0)
Hemoglobin: 13.8 g/dL (ref 12.0–15.0)
Lymphocytes Relative: 18.4 % (ref 12.0–46.0)
Lymphs Abs: 1 10*3/uL (ref 0.7–4.0)
MCHC: 34 g/dL (ref 30.0–36.0)
MCV: 89.3 fl (ref 78.0–100.0)
Monocytes Absolute: 0.9 10*3/uL (ref 0.1–1.0)
Monocytes Relative: 16.3 % — ABNORMAL HIGH (ref 3.0–12.0)
NEUTROS ABS: 3.4 10*3/uL (ref 1.4–7.7)
Neutrophils Relative %: 62.4 % (ref 43.0–77.0)
PLATELETS: 325 10*3/uL (ref 150.0–400.0)
RBC: 4.53 Mil/uL (ref 3.87–5.11)
RDW: 13.2 % (ref 11.5–15.5)
WBC: 5.5 10*3/uL (ref 4.0–10.5)

## 2018-09-14 LAB — COMPREHENSIVE METABOLIC PANEL
ALT: 39 U/L — ABNORMAL HIGH (ref 0–35)
AST: 19 U/L (ref 0–37)
Albumin: 3.6 g/dL (ref 3.5–5.2)
Alkaline Phosphatase: 76 U/L (ref 39–117)
BUN: 7 mg/dL (ref 6–23)
CO2: 26 mEq/L (ref 19–32)
Calcium: 8.6 mg/dL (ref 8.4–10.5)
Chloride: 102 mEq/L (ref 96–112)
Creatinine, Ser: 0.6 mg/dL (ref 0.40–1.20)
GFR: 111.45 mL/min (ref >60.00)
Glucose, Bld: 90 mg/dL (ref 70–99)
Potassium: 4.2 mEq/L (ref 3.5–5.1)
Sodium: 135 mEq/L (ref 135–145)
Total Bilirubin: 0.4 mg/dL (ref 0.2–1.2)
Total Protein: 6.4 g/dL (ref 6.0–8.3)

## 2018-09-14 LAB — LIPASE: Lipase: 8 U/L — ABNORMAL LOW (ref 11.0–59.0)

## 2018-09-14 MED ORDER — KETOROLAC TROMETHAMINE 30 MG/ML IJ SOLN
30.0000 mg | Freq: Once | INTRAMUSCULAR | Status: AC
  Administered 2018-09-14: 30 mg via INTRAMUSCULAR

## 2018-09-14 MED ORDER — METHYLPREDNISOLONE ACETATE 40 MG/ML IJ SUSP
40.0000 mg | Freq: Once | INTRAMUSCULAR | Status: AC
  Administered 2018-09-14: 40 mg via INTRAMUSCULAR

## 2018-09-14 MED ORDER — ONDANSETRON HCL 4 MG PO TABS: 4.0000 mg | ORAL_TABLET | Freq: Three times a day (TID) | ORAL | 0 refills | Status: DC | PRN

## 2018-09-14 NOTE — Patient Instructions (Addendum)
You have been given two injections to help with your headache today.  Zofran has been provided for nausea.  Please push fluids, treat nausea, and consider monitoring sugary drinks. Consider things such as chicken soup which can be beneficial for fluid intake also.  Sugary fluids may be contributing to diarrhea.  Immodium can be used and if no improvement with dietary changes and medication, evaluation of your stool can be completed.  Please go to the lab before you leave and also follow up with your provider next week.  If no improvement or worsening symptoms, follow up sooner.    Diarrhea, Adult Diarrhea is when you pass loose and watery poop (stool) often. Diarrhea can make you feel weak and cause you to lose water in your body (get dehydrated). Losing water in your body can cause you to:  Feel tired and thirsty.  Have a dry mouth.  Go pee (urinate) less often. Diarrhea often lasts 2-3 days. However, it can last longer if it is a sign of something more serious. It is important to treat your diarrhea as told by your doctor. Follow these instructions at home: Eating and drinking     Follow these instructions as told by your doctor:  Take an ORS (oral rehydration solution). This is a drink that helps you replace fluids and minerals your body lost. It is sold at pharmacies and stores.  Drink plenty of fluids, such as: ? Water. ? Ice chips. ? Diluted fruit juice. ? Low-calorie sports drinks. ? Milk, if you want.  Avoid drinking fluids that have a lot of sugar or caffeine in them.  Eat bland, easy-to-digest foods in small amounts as you are able. These foods include: ? Bananas. ? Applesauce. ? Rice. ? Low-fat (lean) meats. ? Toast. ? Crackers.  Avoid alcohol.  Avoid spicy or fatty foods.  Medicines  Take over-the-counter and prescription medicines only as told by your doctor.  If you were prescribed an antibiotic medicine, take it as told by your doctor. Do not  stop using the antibiotic even if you start to feel better. General instructions   Wash your hands often using soap and water. If soap and water are not available, use a hand sanitizer. Others in your home should wash their hands as well. Hands should be washed: ? After using the toilet or changing a diaper. ? Before preparing, cooking, or serving food. ? While caring for a sick person. ? While visiting someone in a hospital.  Drink enough fluid to keep your pee (urine) pale yellow.  Rest at home while you get better.  Watch your condition for any changes.  Take a warm bath to help with any burning or pain from having diarrhea.  Keep all follow-up visits as told by your doctor. This is important. Contact a doctor if:  You have a fever.  Your diarrhea gets worse.  You have new symptoms.  You cannot keep fluids down.  You feel light-headed or dizzy.  You have a headache.  You have muscle cramps. Get help right away if:  You have chest pain.  You feel very weak or you pass out (faint).  You have bloody or black poop or poop that looks like tar.  You have very bad pain, cramping, or bloating in your belly (abdomen).  You have trouble breathing or you are breathing very quickly.  Your heart is beating very quickly.  Your skin feels cold and clammy.  You feel confused.  You have signs of  losing too much water in your body, such as: ? Dark pee, very little pee, or no pee. ? Cracked lips. ? Dry mouth. ? Sunken eyes. ? Sleepiness. ? Weakness. Summary  Diarrhea is when you pass loose and watery poop (stool) often.  Diarrhea can make you feel weak and cause you to lose water in your body (get dehydrated).  Take an ORS (oral rehydration solution). This is a drink that is sold at pharmacies and stores.  Eat bland, easy-to-digest foods in small amounts as you are able.  Contact a doctor if your condition gets worse. Get help right away if you have signs that you  have lost too much water in your body. This information is not intended to replace advice given to you by your health care provider. Make sure you discuss any questions you have with your health care provider. Document Released: 02/16/2008 Document Revised: 02/03/2018 Document Reviewed: 02/03/2018 Elsevier Interactive Patient Education  2019 [employer].

## 2018-09-14 NOTE — Progress Notes (Signed)
Subjective:    Patient ID: Chelsea Dyer, female    DOB: 1965/10/05, 53 y.o.   MRN: [id number]  HPI  Chelsea Dyer is a 53 year old female who presents today with Diarrhea: Patient complains of diarrhea.  Onset of diarrhea was approximately one week ago and improves with immodium. Diarrhea is occurring approximately 6  times per day if no immodium however she cannot quantify amount today stating she has not had diarrhea today.  Patient describes diarrhea as watery when it occurs but she has improved with immodium Denies blood in stool, vomiting. She has vomited previously when symptoms first startedbut denies vomiting today and denies blood in vomit previously Diarrhea has been associated with nausea without vomiting  Patient denies fever, SOB, ear pain/pressure, sinus pressure/pain, sore throat Associated headaches and history of migraines. She is taking maxalt which provides  Benefit. She denies changes in vision, numbness, tingling, or weakness. Associated mild photophobia is present Previous visits for diarrhea: 09/07/18 Evaluation to date: CBC, CMP, lipase on 09/07/18 which were unremarkable Recent trigger:  No known trigger She is eating jello, toast, and seven up.  She was evaluated for diarrhea on 09/07/18 and migraines. She was treated with zofran and also toradol 30 mg IM and depo-medrol 40 IM for migraine. She is unable to clearly describe if this regimen fully improved headache or not. She believes there was some improvement.  Test for influenza was negative that day.   She was evaluated on 09/08/18 for sinusitis symptoms and was treated with azithromycin and she has 2 days left of this medication. Diarrhea was noted before antibiotic and has not worsened with antibiotic therapy.   Review of Systems  Constitutional: Positive for activity change and appetite change. Negative for chills, fatigue and fever.  HENT: Negative for congestion, ear pain, postnasal drip, sinus pressure,  sinus pain, sore throat and tinnitus.   Respiratory: Negative for cough, shortness of breath and wheezing.   Cardiovascular: Negative for chest pain and palpitations.  Gastrointestinal: Positive for diarrhea and nausea. Negative for abdominal pain and vomiting.  Musculoskeletal: Negative for myalgias.  Skin: Negative for rash.  Neurological: Positive for headaches. Negative for dizziness, weakness and light-headedness.   Past Medical History:  Diagnosis Date  . ALLERGIC RHINITIS 03/04/2008  . Anxiety   . DEPRESSION 03/04/2008  . Edema    pedal  . EXOGENOUS OBESITY 06/22/2010  . 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 Prolactinoma [facility])      Social History   Socioeconomic History  . Marital status: Married    Spouse name: Not on file  . Number of children: Not on file  . Years of education: Not on file  . Highest education level: Not on file  Occupational History  . Not on file  Social Needs  . Financial resource strain: Not on file  . Food insecurity:    Worry: Not on file    Inability: Not on file  . Transportation needs:    Medical: Not on file    Non-medical: Not on file  Tobacco Use  . Smoking status: Never Smoker  . Smokeless tobacco: Never Used  Substance and Sexual Activity  . Alcohol use: No    Alcohol/week: 0.0 standard drinks  . Drug use: No  . Sexual activity: Not Currently  Lifestyle  . Physical activity:    Days per week: Not on file    Minutes per session: Not on file  . Stress: Not on file  Relationships  . Social connections:  Talks on phone: Not on file    Gets together: Not on file    Attends religious service: Not on file    Active member of club or organization: Not on file    Attends meetings of clubs or organizations: Not on file    Relationship status: Not on file  . Intimate partner violence:    Fear of current or ex partner: Not on file    Emotionally abused: Not on file    Physically abused: Not on file    Forced sexual activity:  Not on file  Other Topics Concern  . Not on file  Social History Narrative  . Not on file    Past Surgical History:  Procedure Laterality Date  . BREAST SURGERY  2004   augmentation  . HAMMER TOE SURGERY    . LIPOSUCTION    . NASAL SINUS SURGERY      Family History  Problem Relation Age of Onset  . Arthritis Mother        osteo  . Allergic rhinitis Mother     Allergies  Allergen Reactions  . Percocet [Oxycodone-Acetaminophen] Other (See Comments)    Interfered with patient's antidepressants; made pt feel dizzy  . Septra [Sulfamethoxazole-Trimethoprim] Rash    Current Outpatient Medications on File Prior to Visit  Medication Sig Dispense Refill  . azelastine (ASTELIN) 0.1 % nasal spray Can use two sprays in each nostril twice daily as needed for itchy nose and sneezing. 30 mL 2  . Bepotastine Besilate (BEPREVE) 1.5 % SOLN Can use one drop in each eye twice a day for itchy eyes. 10 mL 5  . Black Cohosh-SoyIsoflav-Magnol (ESTROVEN MENOPAUSE RELIEF) CAPS Take 1 tablet by mouth daily.    Marland Kitchen ELDERBERRY PO Take 2 tablets by mouth as needed.    . ferrous sulfate (IRON SUPPLEMENT) 325 (65 FE) MG tablet Take 325 mg by mouth daily with breakfast.    . fluticasone (FLONASE) 50 MCG/ACT nasal spray Place 2 sprays into both nostrils daily. 18.2 g 2  . ibuprofen (ADVIL) 200 MG tablet Take 800 mg by mouth every 8 (eight) hours as needed.    Marland Kitchen levocetirizine (XYZAL) 5 MG tablet Take 1 tablet (5 mg total) by mouth every evening. 90 tablet 1  . LORazepam (ATIVAN) 0.5 MG tablet TAKE ONE TABLET BY MOUTH TWICE A DAY AS NEEDED FOR ANXIETY 60 tablet 0  . Multiple Vitamin (MULTI-VITAMINS) TABS Take by mouth.    . Probiotic Product (PROBIOTIC-10 PO) Take 1 tablet by mouth daily.    . Pyridoxine HCl (VITAMIN B-6 PO) Take by mouth daily.    . rizatriptan (MAXALT) 10 MG tablet TAKE 1 TABLET BY MOUTH AS NEEDED FOR MIGRAINES. MAY REPEAT IN 2 HOURS IF NEEDED 9 tablet 0  . TURMERIC PO Take 2 tablets by  mouth daily.    Marland Kitchen venlafaxine XR (EFFEXOR-XR) 150 MG 24 hr capsule Take 1 capsule (150 mg total) by mouth daily. 90 capsule 1  . venlafaxine XR (EFFEXOR-XR) 75 MG 24 hr capsule Take 1 capsule (75 mg total) by mouth daily. 90 capsule 3   No current facility-administered medications on file prior to visit.     BP 106/74   Pulse 93   Temp 98 F (36.7 C) (Oral)   Resp 16   Wt 181 lb (82.1 kg)   LMP 08/24/2018   SpO2 98%   BMI 33.11 kg/m       Objective:   Physical Exam Constitutional:  Appearance: She is ill-appearing.  HENT:     Nose: Nose normal.     Mouth/Throat:     Mouth: Mucous membranes are moist.     Pharynx: Oropharynx is clear.  Eyes:     General: No scleral icterus.    Pupils: Pupils are equal, round, and reactive to light.  Neck:     Musculoskeletal: Neck supple.  Cardiovascular:     Rate and Rhythm: Normal rate and regular rhythm.     Pulses: Normal pulses.     Heart sounds: Normal heart sounds.  Pulmonary:     Effort: Pulmonary effort is normal.     Breath sounds: Normal breath sounds.  Abdominal:     General: Bowel sounds are normal. There is no distension.     Palpations: Abdomen is soft.     Comments: Mild diffuse tenderness without rebound or guarding present  Lymphadenopathy:     Cervical: No cervical adenopathy.  Skin:    General: Skin is warm and dry.     Capillary Refill: Capillary refill takes less than 2 seconds.  Neurological:     Mental Status: She is alert and oriented to person, place, and time.     Motor: No weakness.     Comments: II-Visual fields grossly intact. III/IV/VI-Extraocular movements intact. Pupils reactive bilaterally. V/VII-Smile symmetric, equal eyebrow raise, facial sensation intact XII-midline tongue extension Motor: 5/5 bilaterally with normal tone and bulk Ambulates with a coordinated gait  Psychiatric:        Thought Content: Thought content normal.        Judgment: Judgment normal.        Assessment  & Plan:  1. Flu-like symptoms POC influenza is negative today.  Symptoms are most likely viral in nature. Advised fluids and increasing fluid choices such as chicken soup as all fluids with high amounts of sugar that she is consuming may likely be contributing to diarrhea.   - CBC with Differential/Platelet; Future - Comprehensive metabolic panel; Future - Lipase; Future  2. Diarrhea, unspecified type Will recheck CBC, CMP, and lipase to rule out causes such as infection and also dehydration. She is drinking fluids, urinating, and mucous membranes are moist.   3. Nausea Zofran 4 mg provided for symptom of nausea that can be used if needed. Nausea may be related to history of migraines and headache that she is reporting. Will obtain lab work today and treat headache.  4. Hx of migraines Headache present today. Will provide toradol 30 mg IM and depo-medrol 40 mg IM at visit to improve migraine. Advised follow up with PCP for further evaluation and discussed preventive measures.   Advised follow up with PCP next week or seeking care sooner if no improvement with symptoms.   Delano Metz, FNP-C

## 2018-09-20 ENCOUNTER — Ambulatory Visit (INDEPENDENT_AMBULATORY_CARE_PROVIDER_SITE_OTHER): Payer: Self-pay | Admitting: *Deleted

## 2018-09-20 DIAGNOSIS — J309 Allergic rhinitis, unspecified: Secondary | ICD-10-CM

## 2018-09-28 ENCOUNTER — Ambulatory Visit (INDEPENDENT_AMBULATORY_CARE_PROVIDER_SITE_OTHER): Payer: Self-pay | Admitting: *Deleted

## 2018-09-28 DIAGNOSIS — M79676 Pain in unspecified toe(s): Secondary | ICD-10-CM

## 2018-09-28 DIAGNOSIS — J309 Allergic rhinitis, unspecified: Secondary | ICD-10-CM

## 2018-10-05 ENCOUNTER — Ambulatory Visit (INDEPENDENT_AMBULATORY_CARE_PROVIDER_SITE_OTHER): Payer: Self-pay | Admitting: *Deleted

## 2018-10-05 DIAGNOSIS — J309 Allergic rhinitis, unspecified: Secondary | ICD-10-CM

## 2018-10-12 ENCOUNTER — Ambulatory Visit (INDEPENDENT_AMBULATORY_CARE_PROVIDER_SITE_OTHER): Payer: Self-pay | Admitting: *Deleted

## 2018-10-12 DIAGNOSIS — J309 Allergic rhinitis, unspecified: Secondary | ICD-10-CM

## 2018-10-19 ENCOUNTER — Ambulatory Visit (INDEPENDENT_AMBULATORY_CARE_PROVIDER_SITE_OTHER): Payer: Self-pay

## 2018-10-19 DIAGNOSIS — J309 Allergic rhinitis, unspecified: Secondary | ICD-10-CM

## 2018-11-09 ENCOUNTER — Ambulatory Visit (INDEPENDENT_AMBULATORY_CARE_PROVIDER_SITE_OTHER): Payer: Self-pay | Admitting: *Deleted

## 2018-11-09 DIAGNOSIS — J309 Allergic rhinitis, unspecified: Secondary | ICD-10-CM

## 2018-11-20 NOTE — Progress Notes (Signed)
Exp 11/20/19

## 2018-11-21 DIAGNOSIS — J3089 Other allergic rhinitis: Secondary | ICD-10-CM

## 2018-11-27 ENCOUNTER — Encounter: Payer: Self-pay | Admitting: Internal Medicine

## 2018-11-27 ENCOUNTER — Ambulatory Visit: Payer: 59 | Admitting: Internal Medicine

## 2018-11-27 ENCOUNTER — Ambulatory Visit: Payer: Self-pay | Admitting: *Deleted

## 2018-11-27 ENCOUNTER — Other Ambulatory Visit: Payer: Self-pay

## 2018-11-27 VITALS — BP 130/70 | HR 91 | Temp 98.4°F | Wt 177.5 lb

## 2018-11-27 DIAGNOSIS — L509 Urticaria, unspecified: Secondary | ICD-10-CM

## 2018-11-27 MED ORDER — PREDNISONE 10 MG PO TABS: 10.0000 mg | ORAL_TABLET | Freq: Every day | ORAL | 0 refills | Status: AC

## 2018-11-27 NOTE — Patient Instructions (Signed)
-Hope you feel better soon!  -Take prednisone 10 mg 1 tablet daily for 4 days.  -Keep taking benadryl 1 tablet at bedtime for 4 days.  -Follow up if not improved in 10-14 days.   Hives Hives are itchy, red, swollen areas on your skin. Hives can show up on any part of your body. Hives often fade within 24 hours (acute hives). New hives can show up after old ones fade. This can go on for many days or weeks (chronic hives). Hives do not spread from person to person (are not contagious). Hives are caused by your body's response to something that you are allergic to (allergen). These are sometimes called triggers. You can get hives right after being around a trigger, or hours later. What are the causes?  Allergies to foods.  Insect bites or stings.  Pollen.  Pets.  Latex.  Chemicals.  Spending time in sunlight, heat, or cold.  Exercise.  Stress.  Some medicines.  Viruses. This includes the common cold.  Infections caused by germs (bacteria).  Allergy shots.  Blood transfusions. Sometimes, the cause is not known. What increases the risk?  Being a woman.  Being allergic to foods such as: ? Citrus fruits. ? Milk. ? Eggs. ? Peanuts. ? Tree nuts. ? Shellfish.  Being allergic to: ? Medicines. ? Latex. ? Insects. ? Animals. ? Pollen. What are the signs or symptoms?   Raised, itchy, red or white bumps or patches on your skin. These areas may: ? Get large and swollen. ? Change in shape and location. ? Stand alone or connect to each other over a large area of skin. ? Sting or hurt. ? Turn white when pressed in the center (blanch). In very bad cases, your hands, feet, and face may also get swollen. This may happen if hives start deeper in your skin. How is this treated? Treatment for this condition depends on your symptoms. Treatment may include:  Using cool, wet cloths (cool compresses) or taking cool showers to stop the itching.  Medicines that help: ?  Relieve itching (antihistamines). ? Reduce swelling (corticosteroids). ? Treat infection (antibiotics).  A medicine (omalizumab) that is given as a shot (injection). Your doctor may prescribe this if you have hives that do not get better even after other treatments.  In very bad cases, you may need a shot of a medicine called epinephrineto prevent a life-threatening allergic reaction (anaphylaxis). Follow these instructions at home: Medicines  Take or apply over-the-counter and prescription medicines only as told by your doctor.  If you were prescribed an antibiotic medicine, use it as told by your doctor. Do not stop using it even if you start to feel better. Skin care  Apply cool, wet cloths to the hives.  Do not scratch your skin. Do not rub your skin. General instructions  Do not take hot showers or baths. This can make itching worse.  Do not wear tight clothes.  Use sunscreen and wear clothes that cover your skin when you are outside.  Avoid any triggers that cause your hives. Keep a journal to help track what causes your hives. Write down: ? What medicines you take. ? What you eat and drink. ? What products you use on your skin.  Keep all follow-up visits as told by your doctor. This is important. Contact a doctor if:  Your symptoms are not better with medicine.  Your joints hurt or are swollen. Get help right away if:  You have a fever.  You  have pain in your belly (abdomen).  Your tongue or lips are swollen.  Your eyelids are swollen.  Your chest or throat feels tight.  You have trouble breathing or swallowing. These symptoms may be an emergency. Do not wait to see if the symptoms will go away. Get medical help right away. Call your local emergency services (911 in the [place] Do not drive yourself to the hospital. Summary  Hives are itchy, red, swollen areas on your skin.  Treatment for this condition depends on your symptoms.  Avoid things that cause  your hives. Keep a journal to help track what causes your hives.  Take and apply over-the-counter and prescription medicines only as told by your doctor.  Keep all follow-up visits as told by your doctor. This is important. This information is not intended to replace advice given to you by your health care provider. Make sure you discuss any questions you have with your health care provider. Document Released: 06/08/2008 Document Revised: 03/15/2018 Document Reviewed: 03/15/2018 Elsevier Interactive Patient Education  2019 [employer].

## 2018-11-27 NOTE — Telephone Encounter (Signed)
Noted  

## 2018-11-27 NOTE — Progress Notes (Signed)
Acute Office Visit     CC/Reason for Visit: Itchy rash following spray tan  HPI: Chelsea Dyer is a 53 y.o. female who is coming in today for the above mentioned reasons.  She had a spray tan on 3/9.  3 days later started developing a bumpy, red, itchy rash that became worse quickly and then improved just as quickly.  Today she only has some small papules over her arms and chest area.  She states she went back to the spray tan place and was told that was not the etiology as they use "all natural ingredients".  She has not had any new environmental exposures, any new foods, or any new cleaning supplies.  She has been using over-the-counter Benadryl with significant improvement.   Past Medical/Surgical History: Past Medical History:  Diagnosis Date  . ALLERGIC RHINITIS 03/04/2008  . Anxiety   . DEPRESSION 03/04/2008  . Edema    pedal  . EXOGENOUS OBESITY 06/22/2010  . 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 Prolactinoma [facility])     Past Surgical History:  Procedure Laterality Date  . BREAST SURGERY  2004   augmentation  . HAMMER TOE SURGERY    . LIPOSUCTION    . NASAL SINUS SURGERY      Social History:  reports that she has never smoked. She has never used smokeless tobacco. She reports that she does not drink alcohol or use drugs.  Allergies: Allergies  Allergen Reactions  . Percocet [Oxycodone-Acetaminophen] Other (See Comments)    Interfered with patient's antidepressants; made pt feel dizzy  . Septra [Sulfamethoxazole-Trimethoprim] Rash    Family History:  Family History  Problem Relation Age of Onset  . Arthritis Mother        osteo  . Allergic rhinitis Mother      Current Outpatient Medications:  .  azelastine (ASTELIN) 0.1 % nasal spray, Can use two sprays in each nostril twice daily as needed for itchy nose and sneezing., Disp: 30 mL, Rfl: 2 .  Bepotastine Besilate (BEPREVE) 1.5 % SOLN, Can use one drop in each eye twice a day for itchy eyes., Disp: 10 mL,  Rfl: 5 .  Black Cohosh-SoyIsoflav-Magnol (ESTROVEN MENOPAUSE RELIEF) CAPS, Take 1 tablet by mouth daily., Disp: , Rfl:  .  ELDERBERRY PO, Take 2 tablets by mouth as needed., Disp: , Rfl:  .  ferrous sulfate (IRON SUPPLEMENT) 325 (65 FE) MG tablet, Take 325 mg by mouth daily with breakfast., Disp: , Rfl:  .  fluticasone (FLONASE) 50 MCG/ACT nasal spray, Place 2 sprays into both nostrils daily., Disp: 18.2 g, Rfl: 2 .  ibuprofen (ADVIL) 200 MG tablet, Take 800 mg by mouth every 8 (eight) hours as needed., Disp: , Rfl:  .  levocetirizine (XYZAL) 5 MG tablet, Take 1 tablet (5 mg total) by mouth every evening., Disp: 90 tablet, Rfl: 1 .  LORazepam (ATIVAN) 0.5 MG tablet, TAKE ONE TABLET BY MOUTH TWICE A DAY AS NEEDED FOR ANXIETY, Disp: 60 tablet, Rfl: 0 .  Multiple Vitamin (MULTI-VITAMINS) TABS, Take by mouth., Disp: , Rfl:  .  ondansetron (ZOFRAN) 4 MG tablet, Take 1 tablet (4 mg total) by mouth every 8 (eight) hours as needed for nausea or vomiting., Disp: 20 tablet, Rfl: 0 .  Probiotic Product (PROBIOTIC-10 PO), Take 1 tablet by mouth daily., Disp: , Rfl:  .  Pyridoxine HCl (VITAMIN B-6 PO), Take by mouth daily., Disp: , Rfl:  .  rizatriptan (MAXALT) 10 MG tablet, TAKE 1 TABLET BY MOUTH  AS NEEDED FOR MIGRAINES. MAY REPEAT IN 2 HOURS IF NEEDED, Disp: 9 tablet, Rfl: 0 .  TURMERIC PO, Take 2 tablets by mouth daily., Disp: , Rfl:  .  venlafaxine XR (EFFEXOR-XR) 150 MG 24 hr capsule, Take 1 capsule (150 mg total) by mouth daily., Disp: 90 capsule, Rfl: 1 .  venlafaxine XR (EFFEXOR-XR) 75 MG 24 hr capsule, Take 1 capsule (75 mg total) by mouth daily., Disp: 90 capsule, Rfl: 3 .  predniSONE (DELTASONE) 10 MG tablet, Take 1 tablet (10 mg total) by mouth daily with breakfast for 4 days., Disp: 4 tablet, Rfl: 0  Review of Systems:  Constitutional: Denies fever, chills, diaphoresis, appetite change and fatigue.  HEENT: Denies photophobia, eye pain, redness, hearing loss, ear pain, congestion, sore throat,  rhinorrhea, sneezing, mouth sores, trouble swallowing, neck pain, neck stiffness and tinnitus.   Respiratory: Denies SOB, DOE, cough, chest tightness,  and wheezing.   Cardiovascular: Denies chest pain, palpitations and leg swelling.  Gastrointestinal: Denies nausea, vomiting, abdominal pain, diarrhea, constipation, blood in stool and abdominal distention.  Genitourinary: Denies dysuria, urgency, frequency, hematuria, flank pain and difficulty urinating.  Endocrine: Denies: hot or cold intolerance, sweats, changes in hair or nails, polyuria, polydipsia. Musculoskeletal: Denies myalgias, back pain, joint swelling, arthralgias and gait problem.  Skin: Denies pallor and wound.  Neurological: Denies dizziness, seizures, syncope, weakness, light-headedness, numbness and headaches.  Hematological: Denies adenopathy. Easy bruising, personal or family bleeding history  Psychiatric/Behavioral: Denies suicidal ideation, mood changes, confusion, nervousness, sleep disturbance and agitation    Physical Exam: Vitals:   11/27/18 1536  BP: 130/70  Pulse: 91  Temp: 98.4 F (36.9 C)  TempSrc: Oral  SpO2: 99%  Weight: 177 lb 8 oz (80.5 kg)    Body mass index is 32.47 kg/m.   Constitutional: NAD, calm, comfortable Eyes: PERRL, lids and conjunctivae normal ENMT: Mucous membranes are moist.  Neck: normal, supple, no masses, no thyromegaly Skin: Mild red papules most noticeable on the back of her leg, upper arms and chest area Psychiatric: Normal judgment and insight. Alert and oriented x 3. Normal mood.    Impression and Plan:  Hives  -Given her only new exposure was this prior to him, I suspect this is the etiology. -Will give prednisone x4 days, advised continue use of Benadryl.  Will return to clinic if no improvement in 10 to 14 days.    Patient Instructions  -Hope you feel better soon!  -Take prednisone 10 mg 1 tablet daily for 4 days.  -Keep taking benadryl 1 tablet at bedtime for  4 days.  -Follow up if not improved in 10-14 days.   Hives Hives are itchy, red, swollen areas on your skin. Hives can show up on any part of your body. Hives often fade within 24 hours (acute hives). New hives can show up after old ones fade. This can go on for many days or weeks (chronic hives). Hives do not spread from person to person (are not contagious). Hives are caused by your body's response to something that you are allergic to (allergen). These are sometimes called triggers. You can get hives right after being around a trigger, or hours later. What are the causes?  Allergies to foods.  Insect bites or stings.  Pollen.  Pets.  Latex.  Chemicals.  Spending time in sunlight, heat, or cold.  Exercise.  Stress.  Some medicines.  Viruses. This includes the common cold.  Infections caused by germs (bacteria).  Allergy shots.  Blood transfusions.  Sometimes, the cause is not known. What increases the risk?  Being a woman.  Being allergic to foods such as: ? Citrus fruits. ? Milk. ? Eggs. ? Peanuts. ? Tree nuts. ? Shellfish.  Being allergic to: ? Medicines. ? Latex. ? Insects. ? Animals. ? Pollen. What are the signs or symptoms?   Raised, itchy, red or white bumps or patches on your skin. These areas may: ? Get large and swollen. ? Change in shape and location. ? Stand alone or connect to each other over a large area of skin. ? Sting or hurt. ? Turn white when pressed in the center (blanch). In very bad cases, your hands, feet, and face may also get swollen. This may happen if hives start deeper in your skin. How is this treated? Treatment for this condition depends on your symptoms. Treatment may include:  Using cool, wet cloths (cool compresses) or taking cool showers to stop the itching.  Medicines that help: ? Relieve itching (antihistamines). ? Reduce swelling (corticosteroids). ? Treat infection (antibiotics).  A medicine (omalizumab)  that is given as a shot (injection). Your doctor may prescribe this if you have hives that do not get better even after other treatments.  In very bad cases, you may need a shot of a medicine called epinephrineto prevent a life-threatening allergic reaction (anaphylaxis). Follow these instructions at home: Medicines  Take or apply over-the-counter and prescription medicines only as told by your doctor.  If you were prescribed an antibiotic medicine, use it as told by your doctor. Do not stop using it even if you start to feel better. Skin care  Apply cool, wet cloths to the hives.  Do not scratch your skin. Do not rub your skin. General instructions  Do not take hot showers or baths. This can make itching worse.  Do not wear tight clothes.  Use sunscreen and wear clothes that cover your skin when you are outside.  Avoid any triggers that cause your hives. Keep a journal to help track what causes your hives. Write down: ? What medicines you take. ? What you eat and drink. ? What products you use on your skin.  Keep all follow-up visits as told by your doctor. This is important. Contact a doctor if:  Your symptoms are not better with medicine.  Your joints hurt or are swollen. Get help right away if:  You have a fever.  You have pain in your belly (abdomen).  Your tongue or lips are swollen.  Your eyelids are swollen.  Your chest or throat feels tight.  You have trouble breathing or swallowing. These symptoms may be an emergency. Do not wait to see if the symptoms will go away. Get medical help right away. Call your local emergency services (911 in the [place] Do not drive yourself to the hospital. Summary  Hives are itchy, red, swollen areas on your skin.  Treatment for this condition depends on your symptoms.  Avoid things that cause your hives. Keep a journal to help track what causes your hives.  Take and apply over-the-counter and prescription medicines only  as told by your doctor.  Keep all follow-up visits as told by your doctor. This is important. This information is not intended to replace advice given to you by your health care provider. Make sure you discuss any questions you have with your health care provider. Document Released: 06/08/2008 Document Revised: 03/15/2018 Document Reviewed: 03/15/2018 Elsevier Interactive Patient Education  2019 [employer].  Estela Hernandez Acosta, MD [facility]   

## 2018-11-27 NOTE — Telephone Encounter (Signed)
Pt called with complaints of hives on her neck, face,ears, entire body; she pt had spray tan on 11/20/2018, and her symptoms started on 11/23/2018; the says that she takes 1 benadryl every 4 hours to stop the itching; the area around her chest is inflammed; she says that she has also had an epsom salt bath and used lubriderm; recommendations made per protocol; pt offered and accepted appointment with Dr Mauri [place], [place], 11/27/2018 at 1545; she verbalized understanding; will route to office for notification.     Reason for Disposition . [1] MODERATE-SEVERE hives persist (i.e., hives interfere with normal activities or work) AND [2] taking antihistamine (e.g., Benadryl, Claritin) > 24 hours  Answer Assessment - Initial Assessment Questions 1. APPEARANCE: "What does the rash look like?"      bumps all over skin; no redness; no blister 2. LOCATION: "Where is the rash located?"      All over body; started behind knees 3. NUMBER: "How many hives are there?"      Too many to count 4. SIZE: "How big are the hives?" (inches, cm, compare to coins) "Do they all look the same or is there lots of variation in shape and size?"      Too many to explain 5. ONSET: "When did the hives begin?" (Hours or days ago)      11/23/2018 6. ITCHING: "Does it itch?" If so, ask: "How bad is the itch?"    - MILD: doesn't interfere with normal activities   - MODERATE - SEVERE: interferes with work, school, sleep, or other activities      severe 7. RECURRENT PROBLEM: "Have you had hives before?" If so, ask: "When was the last time?" and "What happened that time?"      no 8. TRIGGERS: "Were you exposed to any new food, plant, cosmetic product or animal just before the hives began?"     Spray tan on 11/20/2018 9. OTHER SYMPTOMS: "Do you have any other symptoms?" (e.g., fever, tongue swelling, difficulty breathing, abdominal pain)     no 10. PREGNANCY: "Is there any chance you are pregnant?" "When was your last menstrual  period?"       No; LMP 11/09/2018  Protocols used: HIVES-A-AH

## 2018-12-08 ENCOUNTER — Encounter: Payer: Self-pay | Admitting: *Deleted

## 2018-12-08 ENCOUNTER — Other Ambulatory Visit: Payer: Self-pay

## 2018-12-08 ENCOUNTER — Telehealth: Payer: Self-pay | Admitting: Internal Medicine

## 2018-12-08 ENCOUNTER — Ambulatory Visit (INDEPENDENT_AMBULATORY_CARE_PROVIDER_SITE_OTHER): Payer: 59 | Admitting: Internal Medicine

## 2018-12-08 DIAGNOSIS — J069 Acute upper respiratory infection, unspecified: Secondary | ICD-10-CM

## 2018-12-08 DIAGNOSIS — B9789 Other viral agents as the cause of diseases classified elsewhere: Secondary | ICD-10-CM

## 2018-12-08 NOTE — Telephone Encounter (Signed)
Pt scheduled for a Webex today at 4:30 with Dr Jerilee Hoh Nothing further needed.

## 2018-12-08 NOTE — Progress Notes (Signed)
Virtual Visit via Video Note  I connected with Chelsea Dyer on 12/08/18 at  4:30 PM EDT by a video enabled telemedicine application and verified that I am speaking with the correct person using two identifiers.  Location patient: home Location provider: work office Persons participating in the virtual visit: patient, provider  I discussed the limitations of evaluation and management by telemedicine and the availability of in person appointments. The patient expressed understanding and agreed to proceed.   HPI: Chelsea Dyer has been having a headache, sore throat and itchy eyes and ears for about 3 days. She denies cough, fever, SOB. She states it is a little hard to swallow. No recent travel, husband has been sick with URI symptoms. She has been using flonase, zyrtec, chloraseptic and saline nasal spray.   ROS: Constitutional: Denies fever, chills, diaphoresis, appetite change and fatigue.  HEENT: Denies photophobia, eye pain, redness, hearing loss, ear pain, congestion, sore throat, rhinorrhea, sneezing, mouth sores, trouble swallowing, neck pain, neck stiffness and tinnitus.   Respiratory: Denies SOB, DOE, cough, chest tightness,  and wheezing.   Cardiovascular: Denies chest pain, palpitations and leg swelling.  Gastrointestinal: Denies nausea, vomiting, abdominal pain, diarrhea, constipation, blood in stool and abdominal distention.  Genitourinary: Denies dysuria, urgency, frequency, hematuria, flank pain and difficulty urinating.  Endocrine: Denies: hot or cold intolerance, sweats, changes in hair or nails, polyuria, polydipsia. Musculoskeletal: Denies myalgias, back pain, joint swelling, arthralgias and gait problem.  Skin: Denies pallor, rash and wound.  Neurological: Denies dizziness, seizures, syncope, weakness, light-headedness, numbness and headaches.  Hematological: Denies adenopathy. Easy bruising, personal or family bleeding history  Psychiatric/Behavioral: Denies suicidal  ideation, mood changes, confusion, nervousness, sleep disturbance and agitation   Past Medical History:  Diagnosis Date  . ALLERGIC RHINITIS 03/04/2008  . Anxiety   . DEPRESSION 03/04/2008  . Edema    pedal  . EXOGENOUS OBESITY 06/22/2010  . 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 Prolactinoma [facility])     Past Surgical History:  Procedure Laterality Date  . BREAST SURGERY  2004   augmentation  . HAMMER TOE SURGERY    . LIPOSUCTION    . NASAL SINUS SURGERY      Family History  Problem Relation Age of Onset  . Arthritis Mother        osteo  . Allergic rhinitis Mother     SOCIAL HX:   reports that she has never smoked. She has never used smokeless tobacco. She reports that she does not drink alcohol or use drugs.   Current Outpatient Medications:  .  azelastine (ASTELIN) 0.1 % nasal spray, Can use two sprays in each nostril twice daily as needed for itchy nose and sneezing., Disp: 30 mL, Rfl: 2 .  Bepotastine Besilate (BEPREVE) 1.5 % SOLN, Can use one drop in each eye twice a day for itchy eyes., Disp: 10 mL, Rfl: 5 .  Black Cohosh-SoyIsoflav-Magnol (ESTROVEN MENOPAUSE RELIEF) CAPS, Take 1 tablet by mouth daily., Disp: , Rfl:  .  ELDERBERRY PO, Take 2 tablets by mouth as needed., Disp: , Rfl:  .  ferrous sulfate (IRON SUPPLEMENT) 325 (65 FE) MG tablet, Take 325 mg by mouth daily with breakfast., Disp: , Rfl:  .  fluticasone (FLONASE) 50 MCG/ACT nasal spray, Place 2 sprays into both nostrils daily., Disp: 18.2 g, Rfl: 2 .  ibuprofen (ADVIL) 200 MG tablet, Take 800 mg by mouth every 8 (eight) hours as needed., Disp: , Rfl:  .  levocetirizine (XYZAL) 5 MG tablet, Take 1  tablet (5 mg total) by mouth every evening., Disp: 90 tablet, Rfl: 1 .  LORazepam (ATIVAN) 0.5 MG tablet, TAKE ONE TABLET BY MOUTH TWICE A DAY AS NEEDED FOR ANXIETY, Disp: 60 tablet, Rfl: 0 .  Multiple Vitamin (MULTI-VITAMINS) TABS, Take by mouth., Disp: , Rfl:  .  ondansetron (ZOFRAN) 4 MG tablet, Take 1 tablet (4 mg  total) by mouth every 8 (eight) hours as needed for nausea or vomiting., Disp: 20 tablet, Rfl: 0 .  Probiotic Product (PROBIOTIC-10 PO), Take 1 tablet by mouth daily., Disp: , Rfl:  .  Pyridoxine HCl (VITAMIN B-6 PO), Take by mouth daily., Disp: , Rfl:  .  rizatriptan (MAXALT) 10 MG tablet, TAKE 1 TABLET BY MOUTH AS NEEDED FOR MIGRAINES. MAY REPEAT IN 2 HOURS IF NEEDED, Disp: 9 tablet, Rfl: 0 .  TURMERIC PO, Take 2 tablets by mouth daily., Disp: , Rfl:  .  venlafaxine XR (EFFEXOR-XR) 150 MG 24 hr capsule, Take 1 capsule (150 mg total) by mouth daily., Disp: 90 capsule, Rfl: 1 .  venlafaxine XR (EFFEXOR-XR) 75 MG 24 hr capsule, Take 1 capsule (75 mg total) by mouth daily., Disp: 90 capsule, Rfl: 3  EXAM:   VITALS per patient if applicable: none available  GENERAL: alert, oriented, appears well and in no major acute distress  HEENT: atraumatic, conjunttiva clear, no obvious abnormalities on inspection of external nose and ears. With patient's assistance with a flashlight, I have been able to look at her throat. No apparent exudates and mild pharyngeal erythema appreciated.  NECK: normal movements of the head and neck  LUNGS: on inspection no signs of respiratory distress, breathing rate appears normal, no obvious gross increased work of breathing, gasping or wheezing  CV: no obvious cyanosis  MS: moves all visible extremities without noticeable abnormality  PSYCH/NEURO: pleasant and cooperative, no obvious depression or anxiety, speech and thought processing grossly intact  ASSESSMENT AND PLAN:   Viral URI with cough -Given limited exam findings, PNA, pharyngitis, ear infection are not likely, hence abx have not been prescribed. -Have advised rest, fluids, OTC antihistamines, cough suppressants and mucinex. -Because we can't exclude that these might be early COVID-19 symptoms, have advised self-isolation at home; letter written for her employer to that effect. -We will follow up with  her next week via webex to ensure her symptoms have not worsened.     I discussed the assessment and treatment plan with the patient. The patient was provided an opportunity to ask questions and all were answered. The patient agreed with the plan and demonstrated an understanding of the instructions.   The patient was advised to call back or seek an in-person evaluation if the symptoms worsen or if the condition fails to improve as anticipated.  I provided 18 minutes of non-face-to-face time during this encounter.   Lelon Frohlich, MD  [facility]

## 2018-12-08 NOTE — Telephone Encounter (Signed)
Copied from [place] [phone]. Topic: Quick Communication - See Telephone Encounter >> Dec 08, 2018  3:29 PM Margot Ables wrote: CRM for notification. See Telephone encounter for: 12/08/18. Pt noting headache, sore throat, pressure in eyes and cheeks, no fever, no sob, mild cough from drainage, hurts to swallow x 2 days. Mucus has been light yellow. Pt believes she has a sinus infection and asking if ABX can be sent in for her. Last OV was 3/16 for separate issue. Please advise.   Ammie Ferrier [address] [place] [address] [phone] (Phone) [phone] (Fax)

## 2018-12-13 ENCOUNTER — Ambulatory Visit (INDEPENDENT_AMBULATORY_CARE_PROVIDER_SITE_OTHER): Payer: Self-pay | Admitting: Internal Medicine

## 2018-12-13 ENCOUNTER — Other Ambulatory Visit: Payer: Self-pay

## 2018-12-13 DIAGNOSIS — B9789 Other viral agents as the cause of diseases classified elsewhere: Secondary | ICD-10-CM

## 2018-12-13 DIAGNOSIS — J069 Acute upper respiratory infection, unspecified: Secondary | ICD-10-CM

## 2018-12-13 NOTE — Progress Notes (Signed)
Virtual Visit via Video Note  I connected with Chelsea Dyer on 12/13/18 at  9:30 AM EDT by a video enabled telemedicine application and verified that I am speaking with the correct person using two identifiers.  Location patient: home Location provider: work office Persons participating in the virtual visit: patient, provider  I discussed the limitations of evaluation and management by telemedicine and the availability of in person appointments. The patient expressed understanding and agreed to proceed.   HPI: Chelsea Dyer is seen as a follow up from last week with viral URI and cough. She is feeling maybe a little better but essentially unchanged. Still with dry cough and itchy eyes. She has been self-isolating from family at home per my recommendations, she has stayed out of work (we provided her with a letter to her employer). Has not had any fever or shortness of breath.   ROS: Constitutional: Denies fever, chills, diaphoresis, appetite change and fatigue.  HEENT: Denies photophobia, eye pain, redness, hearing loss, ear pain,  mouth sores, trouble swallowing, neck pain, neck stiffness and tinnitus.   Respiratory: Denies SOB, DOE,  chest tightness,  and wheezing.  positive dry cough Cardiovascular: Denies chest pain, palpitations and leg swelling.  Gastrointestinal: Denies nausea, vomiting, abdominal pain, diarrhea, constipation, blood in stool and abdominal distention.  Genitourinary: Denies dysuria, urgency, frequency, hematuria, flank pain and difficulty urinating.  Endocrine: Denies: hot or cold intolerance, sweats, changes in hair or nails, polyuria, polydipsia. Musculoskeletal: Denies myalgias, back pain, joint swelling, arthralgias and gait problem.  Skin: Denies pallor, rash and wound.  Neurological: Denies dizziness, seizures, syncope, weakness, light-headedness, numbness and headaches.  Hematological: Denies adenopathy. Easy bruising, personal or family bleeding history   Psychiatric/Behavioral: Denies suicidal ideation, mood changes, confusion, nervousness, sleep disturbance and agitation   Past Medical History:  Diagnosis Date  . ALLERGIC RHINITIS 03/04/2008  . Anxiety   . DEPRESSION 03/04/2008  . Edema    pedal  . EXOGENOUS OBESITY 06/22/2010  . 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 Prolactinoma [facility])     Past Surgical History:  Procedure Laterality Date  . BREAST SURGERY  2004   augmentation  . HAMMER TOE SURGERY    . LIPOSUCTION    . NASAL SINUS SURGERY      Family History  Problem Relation Age of Onset  . Arthritis Mother        osteo  . Allergic rhinitis Mother     SOCIAL HX:   reports that she has never smoked. She has never used smokeless tobacco. She reports that she does not drink alcohol or use drugs.   Current Outpatient Medications:  .  azelastine (ASTELIN) 0.1 % nasal spray, Can use two sprays in each nostril twice daily as needed for itchy nose and sneezing., Disp: 30 mL, Rfl: 2 .  Bepotastine Besilate (BEPREVE) 1.5 % SOLN, Can use one drop in each eye twice a day for itchy eyes., Disp: 10 mL, Rfl: 5 .  Black Cohosh-SoyIsoflav-Magnol (ESTROVEN MENOPAUSE RELIEF) CAPS, Take 1 tablet by mouth daily., Disp: , Rfl:  .  ELDERBERRY PO, Take 2 tablets by mouth as needed., Disp: , Rfl:  .  ferrous sulfate (IRON SUPPLEMENT) 325 (65 FE) MG tablet, Take 325 mg by mouth daily with breakfast., Disp: , Rfl:  .  fluticasone (FLONASE) 50 MCG/ACT nasal spray, Place 2 sprays into both nostrils daily., Disp: 18.2 g, Rfl: 2 .  ibuprofen (ADVIL) 200 MG tablet, Take 800 mg by mouth every 8 (eight) hours as needed.,  Disp: , Rfl:  .  levocetirizine (XYZAL) 5 MG tablet, Take 1 tablet (5 mg total) by mouth every evening., Disp: 90 tablet, Rfl: 1 .  LORazepam (ATIVAN) 0.5 MG tablet, TAKE ONE TABLET BY MOUTH TWICE A DAY AS NEEDED FOR ANXIETY, Disp: 60 tablet, Rfl: 0 .  Multiple Vitamin (MULTI-VITAMINS) TABS, Take by mouth., Disp: , Rfl:  .  ondansetron  (ZOFRAN) 4 MG tablet, Take 1 tablet (4 mg total) by mouth every 8 (eight) hours as needed for nausea or vomiting., Disp: 20 tablet, Rfl: 0 .  Probiotic Product (PROBIOTIC-10 PO), Take 1 tablet by mouth daily., Disp: , Rfl:  .  Pyridoxine HCl (VITAMIN B-6 PO), Take by mouth daily., Disp: , Rfl:  .  rizatriptan (MAXALT) 10 MG tablet, TAKE 1 TABLET BY MOUTH AS NEEDED FOR MIGRAINES. MAY REPEAT IN 2 HOURS IF NEEDED, Disp: 9 tablet, Rfl: 0 .  TURMERIC PO, Take 2 tablets by mouth daily., Disp: , Rfl:  .  venlafaxine XR (EFFEXOR-XR) 150 MG 24 hr capsule, Take 1 capsule (150 mg total) by mouth daily., Disp: 90 capsule, Rfl: 1 .  venlafaxine XR (EFFEXOR-XR) 75 MG 24 hr capsule, Take 1 capsule (75 mg total) by mouth daily., Disp: 90 capsule, Rfl: 3  EXAM:   VITALS per patient if applicable: none reported  GENERAL: alert, oriented, appears well and in no acute distress  HEENT: atraumatic, conjunttiva clear, no obvious abnormalities on inspection of external nose and ears  NECK: normal movements of the head and neck  LUNGS: on inspection no signs of respiratory distress, breathing rate appears normal, no obvious gross increased work of breathing, gasping or wheezing  CV: no obvious cyanosis  MS: moves all visible extremities without noticeable abnormality  PSYCH/NEURO: pleasant and cooperative, no obvious depression or anxiety, speech and thought processing grossly intact  ASSESSMENT AND PLAN:   Viral URI with cough -Running expected course. -No signs concerning for respiratory distress. -Continue to monitor and self-isolate at home. -Knows to call with high fever or SOB.    I discussed the assessment and treatment plan with the patient. The patient was provided an opportunity to ask questions and all were answered. The patient agreed with the plan and demonstrated an understanding of the instructions.   The patient was advised to call back or seek an in-person evaluation if the symptoms  worsen or if the condition fails to improve as anticipated.    Lelon Frohlich, MD  [facility] at [facility]

## 2018-12-14 ENCOUNTER — Ambulatory Visit (INDEPENDENT_AMBULATORY_CARE_PROVIDER_SITE_OTHER): Payer: Self-pay

## 2018-12-14 ENCOUNTER — Other Ambulatory Visit: Payer: Self-pay | Admitting: Internal Medicine

## 2018-12-14 DIAGNOSIS — J309 Allergic rhinitis, unspecified: Secondary | ICD-10-CM

## 2018-12-14 DIAGNOSIS — Z8669 Personal history of other diseases of the nervous system and sense organs: Secondary | ICD-10-CM

## 2018-12-28 ENCOUNTER — Ambulatory Visit (INDEPENDENT_AMBULATORY_CARE_PROVIDER_SITE_OTHER): Payer: Self-pay | Admitting: *Deleted

## 2018-12-28 DIAGNOSIS — J309 Allergic rhinitis, unspecified: Secondary | ICD-10-CM

## 2019-01-05 ENCOUNTER — Ambulatory Visit (INDEPENDENT_AMBULATORY_CARE_PROVIDER_SITE_OTHER): Payer: Self-pay | Admitting: *Deleted

## 2019-01-05 DIAGNOSIS — J309 Allergic rhinitis, unspecified: Secondary | ICD-10-CM

## 2019-01-09 ENCOUNTER — Ambulatory Visit (INDEPENDENT_AMBULATORY_CARE_PROVIDER_SITE_OTHER): Payer: Self-pay | Admitting: *Deleted

## 2019-01-09 DIAGNOSIS — J309 Allergic rhinitis, unspecified: Secondary | ICD-10-CM

## 2019-01-15 ENCOUNTER — Ambulatory Visit (INDEPENDENT_AMBULATORY_CARE_PROVIDER_SITE_OTHER): Payer: Self-pay

## 2019-01-15 DIAGNOSIS — J309 Allergic rhinitis, unspecified: Secondary | ICD-10-CM

## 2019-01-24 ENCOUNTER — Ambulatory Visit (INDEPENDENT_AMBULATORY_CARE_PROVIDER_SITE_OTHER): Payer: Self-pay | Admitting: *Deleted

## 2019-01-24 DIAGNOSIS — J309 Allergic rhinitis, unspecified: Secondary | ICD-10-CM

## 2019-01-30 ENCOUNTER — Other Ambulatory Visit: Payer: Self-pay | Admitting: Internal Medicine

## 2019-01-30 DIAGNOSIS — F419 Anxiety disorder, unspecified: Secondary | ICD-10-CM

## 2019-02-01 ENCOUNTER — Encounter: Payer: Self-pay | Admitting: Internal Medicine

## 2019-02-01 ENCOUNTER — Ambulatory Visit: Payer: Self-pay | Admitting: *Deleted

## 2019-02-01 ENCOUNTER — Other Ambulatory Visit: Payer: Self-pay

## 2019-02-01 ENCOUNTER — Ambulatory Visit (INDEPENDENT_AMBULATORY_CARE_PROVIDER_SITE_OTHER): Payer: 59 | Admitting: Internal Medicine

## 2019-02-01 DIAGNOSIS — F339 Major depressive disorder, recurrent, unspecified: Secondary | ICD-10-CM | POA: Diagnosis not present

## 2019-02-01 MED ORDER — ESCITALOPRAM OXALATE 10 MG PO TABS: 10.0000 mg | ORAL_TABLET | Freq: Every day | ORAL | 2 refills | Status: DC

## 2019-02-01 NOTE — Progress Notes (Signed)
Virtual Visit via Video Note  I connected with Chelsea Dyer on 02/01/19 at  2:30 PM EDT by a video enabled telemedicine application and verified that I am speaking with the correct person using two identifiers.  Location patient: home Location provider: work office Persons participating in the virtual visit: patient, provider  I discussed the limitations of evaluation and management by telemedicine and the availability of in person appointments. The patient expressed understanding and agreed to proceed.   HPI: She has scheduled this visit to discuss her mood. She has a h/o depression and is on effexor 225 mg daily. She lost a friend in Feb has a lot of social stressors with a daughter with Down's Syndrome, a husband with Parkinson's dementia. She has been very tearful. She feels her life is "not worth living". She does not have a plan to end her life. She feels if she is hospitalized they may take her daughter away. She does not currently see a therapist. PHQ 9 is 13 today.   ROS: Constitutional: Denies fever, chills, diaphoresis, appetite change and fatigue.  HEENT: Denies photophobia, eye pain, redness, hearing loss, ear pain, congestion, sore throat, rhinorrhea, sneezing, mouth sores, trouble swallowing, neck pain, neck stiffness and tinnitus.   Respiratory: Denies SOB, DOE, cough, chest tightness,  and wheezing.   Cardiovascular: Denies chest pain, palpitations and leg swelling.  Gastrointestinal: Denies nausea, vomiting, abdominal pain, diarrhea, constipation, blood in stool and abdominal distention.  Genitourinary: Denies dysuria, urgency, frequency, hematuria, flank pain and difficulty urinating.  Endocrine: Denies: hot or cold intolerance, sweats, changes in hair or nails, polyuria, polydipsia. Musculoskeletal: Denies myalgias, back pain, joint swelling, arthralgias and gait problem.  Skin: Denies pallor, rash and wound.  Neurological: Denies dizziness, seizures, syncope,  weakness, light-headedness, numbness and headaches.  Hematological: Denies adenopathy. Easy bruising, personal or family bleeding history  Psychiatric/Behavioral: Positive for mood changes, confusion, nervousness, sleep disturbance and agitation, no plan for suicide   Past Medical History:  Diagnosis Date  . ALLERGIC RHINITIS 03/04/2008  . Anxiety   . DEPRESSION 03/04/2008  . Edema    pedal  . EXOGENOUS OBESITY 06/22/2010  . 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 Prolactinoma [facility])     Past Surgical History:  Procedure Laterality Date  . BREAST SURGERY  2004   augmentation  . HAMMER TOE SURGERY    . LIPOSUCTION    . NASAL SINUS SURGERY      Family History  Problem Relation Age of Onset  . Arthritis Mother        osteo  . Allergic rhinitis Mother     SOCIAL HX:   reports that she has never smoked. She has never used smokeless tobacco. She reports that she does not drink alcohol or use drugs.   Current Outpatient Medications:  .  azelastine (ASTELIN) 0.1 % nasal spray, Can use two sprays in each nostril twice daily as needed for itchy nose and sneezing., Disp: 30 mL, Rfl: 2 .  Bepotastine Besilate (BEPREVE) 1.5 % SOLN, Can use one drop in each eye twice a day for itchy eyes., Disp: 10 mL, Rfl: 5 .  Black Cohosh-SoyIsoflav-Magnol (ESTROVEN MENOPAUSE RELIEF) CAPS, Take 1 tablet by mouth daily., Disp: , Rfl:  .  ELDERBERRY PO, Take 2 tablets by mouth as needed., Disp: , Rfl:  .  ferrous sulfate (IRON SUPPLEMENT) 325 (65 FE) MG tablet, Take 325 mg by mouth daily with breakfast., Disp: , Rfl:  .  fluticasone (FLONASE) 50 MCG/ACT nasal spray, Place  2 sprays into both nostrils daily., Disp: 18.2 g, Rfl: 2 .  ibuprofen (ADVIL) 200 MG tablet, Take 800 mg by mouth every 8 (eight) hours as needed., Disp: , Rfl:  .  levocetirizine (XYZAL) 5 MG tablet, Take 1 tablet (5 mg total) by mouth every evening., Disp: 90 tablet, Rfl: 1 .  LORazepam (ATIVAN) 0.5 MG tablet, TAKE ONE TABLET BY MOUTH  TWICE A DAY AS NEEDED FOR ANXIETY, Disp: 60 tablet, Rfl: 0 .  Multiple Vitamin (MULTI-VITAMINS) TABS, Take by mouth., Disp: , Rfl:  .  ondansetron (ZOFRAN) 4 MG tablet, Take 1 tablet (4 mg total) by mouth every 8 (eight) hours as needed for nausea or vomiting., Disp: 20 tablet, Rfl: 0 .  Probiotic Product (PROBIOTIC-10 PO), Take 1 tablet by mouth daily., Disp: , Rfl:  .  Pyridoxine HCl (VITAMIN B-6 PO), Take by mouth daily., Disp: , Rfl:  .  rizatriptan (MAXALT) 10 MG tablet, TAKE 1 TABLET BY MOUTH AS NEEDED FOR MIGRAINES. MAY REPEAT IN 2 HOURS IF NEEDED *MAX 2 TABLETS IN 24 HOURS*, Disp: 9 tablet, Rfl: 2 .  TURMERIC PO, Take 2 tablets by mouth daily., Disp: , Rfl:  .  venlafaxine XR (EFFEXOR-XR) 150 MG 24 hr capsule, Take 1 capsule (150 mg total) by mouth daily., Disp: 90 capsule, Rfl: 1 .  venlafaxine XR (EFFEXOR-XR) 75 MG 24 hr capsule, Take 1 capsule (75 mg total) by mouth daily., Disp: 90 capsule, Rfl: 3 .  escitalopram (LEXAPRO) 10 MG tablet, Take 1 tablet (10 mg total) by mouth daily., Disp: 30 tablet, Rfl: 2  EXAM:   VITALS per patient if applicable: none reported  GENERAL: alert, oriented, visibly tearful and agitated  HEENT: atraumatic, conjunttiva clear, no obvious abnormalities on inspection of external nose and ears, wears corrective lenses  NECK: normal movements of the head and neck  LUNGS: on inspection no signs of respiratory distress, breathing rate appears normal, no obvious gross increased work of breathing, gasping or wheezing  CV: no obvious cyanosis  MS: moves all visible extremities without noticeable abnormality  PSYCH/NEURO: pleasant and cooperative, no obvious depression or anxiety, speech and thought processing grossly intact  ASSESSMENT AND PLAN:   Depression, recurrent (HCC)  -Severe episode. -Add lexapro 10 mg to Effexor 225 mg. -She will call the office and arrange follow up for CBT with Mr. Clovis Pu. -Will follow up with her in 2 weeks. -She knows  to call the suicide prevention hotline or go the ED if she feels suicidal. Numbers and information have been provided via [place].    I discussed the assessment and treatment plan with the patient. The patient was provided an opportunity to ask questions and all were answered. The patient agreed with the plan and demonstrated an understanding of the instructions.   The patient was advised to call back or seek an in-person evaluation if the symptoms worsen or if the condition fails to improve as anticipated.    Lelon Frohlich, MD  [facility] at [facility]

## 2019-02-01 NOTE — Patient Instructions (Signed)

## 2019-02-01 NOTE — Telephone Encounter (Signed)
Patient calls very tearful. Stated she tends to feels this way about every 3-4 months. She is not thinking of harming herself at this time but admits to that thought in the past.Has never had a plan. No thoughts of harming anyone else. Stressed with husband, daughter situation and a mother that lives with her, but none of these are support she can turn to. Her best friend died in 11/19/22 that she is having difficulty overcoming. Takes Effexor XR 150 and 75 MG daily. Asking is something may work better for her. Has not used a therapist but is open to it. Encouraged to speak with her pastor, stated she would. Encouraged her to call back with concerns and questions and if she began to feel worse. Provided support and care advice per protocol. Transferred for virtual visit.  Reason for Disposition . Sometimes has thoughts of suicide  Answer Assessment - Initial Assessment Questions 1. CONCERN: "What happened that made you call today?"     Going through a lot.divorced 8 years ago. Best friend died in 11/19/2022.  2. SUICIDE ATTEMPT: "Have you tried to harm yourself recently?"  "Have you ever tried to harm yourself before?"     no 3. RISK OF HARM - SUICIDAL IDEATION:  "Do you ever have thoughts of hurting or killing yourself?"  (e.g., yes, no, no but preoccupation with thoughts about death)   - INTENT:  "Do you have thoughts of hurting or killing yourself right NOW?" (e.g., yes, no, N/A)no,  She has in the past. Says it comes around every 3-4 months.   - PLAN: "Do you have a specific plan for how you would do this?" (e.g., gun, knife, overdose, no plan, N/A)     No she has never had plan. 4. RISK OF HARM - HOMICIDAL IDEATION:  "Do you ever have thoughts of hurting or killing someone else?"  (e.g., yes, no, no but preoccupation with thoughts about death)   - INTENT:  "Do you have thoughts of hurting or killing someone right NOW?" (e.g., yes, no, N/A)   - PLAN: "Do you have a specific plan for how you would  do this?" (e.g., gun, knife, no plan, N/A)      No plan. 5. ACCESS: If yes to PLAN, "Do you have access to ?" (e.g., pills stored in bathroom, firearm in house, knife in kitchen)     6. SUPPORT: "Who is with you now?" "Who do you live with?" "Do you have family or friends nearby who you can talk to?"      Mother, husband and daughter. None can be support for her. 7. THERAPIST: "Do you have a counselor or therapist? Name?"    none 8. STRESSORS: "Has there been any new stress or recent changes in your life?"     No changes 9. DRUG ABUSE/ALCOHOL: "Do you drink alcohol or use any illegal drugs?"      no 10. OTHER: "Do you have any other health or medical symptoms right now?" (e.g., fever)      no 11. PREGNANCY: "Is there any chance you are pregnant?" "When was your last menstrual period?"       no  Protocols used: SUICIDE CONCERNS-A-AH

## 2019-02-01 NOTE — Telephone Encounter (Signed)
Spoke with patient and virtual visit scheduled 02/01/2019.

## 2019-02-08 ENCOUNTER — Ambulatory Visit (INDEPENDENT_AMBULATORY_CARE_PROVIDER_SITE_OTHER): Payer: Self-pay

## 2019-02-08 DIAGNOSIS — J309 Allergic rhinitis, unspecified: Secondary | ICD-10-CM

## 2019-02-23 ENCOUNTER — Other Ambulatory Visit: Payer: Self-pay

## 2019-02-23 ENCOUNTER — Ambulatory Visit (INDEPENDENT_AMBULATORY_CARE_PROVIDER_SITE_OTHER): Payer: Self-pay | Admitting: Internal Medicine

## 2019-02-23 DIAGNOSIS — F339 Major depressive disorder, recurrent, unspecified: Secondary | ICD-10-CM

## 2019-02-23 NOTE — Progress Notes (Signed)
Virtual Visit via Video Note  I connected with Chelsea Dyer on 02/23/19 at  7:30 AM EDT by a video enabled telemedicine application and verified that I am speaking with the correct person using two identifiers.  Location patient: home Location provider: work office Persons participating in the virtual visit: patient, provider  I discussed the limitations of evaluation and management by telemedicine and the availability of in person appointments. The patient expressed understanding and agreed to proceed.   HPI: This is a 2-week follow-up for depression.  I last contacted with Chelsea Dyer on 5/21.  At that time her mood was extremely depressed and tearful.  She describes some social stressors including a daughter with Down syndrome's, and husband with Parkinson's dementia as well as the death of a close friend in 11/18/2022.  Her PHQ 9 was a 13 then.  She was not suicidal.  She was on Effexor or 225 mg daily and we added Lexapro 10 mg.  Her mood seems much more stable today, she agrees that she is doing better.  She asked her husband's caretaker who also agreed that she is feeling better.  At my suggestion, she is trying to arrange CBT through our office with Dennison Bulla.   ROS: Constitutional: Denies fever, chills, diaphoresis, appetite change and fatigue.  HEENT: Denies photophobia, eye pain, redness, hearing loss, ear pain, congestion, sore throat, rhinorrhea, sneezing, mouth sores, trouble swallowing, neck pain, neck stiffness and tinnitus.   Respiratory: Denies SOB, DOE, cough, chest tightness,  and wheezing.   Cardiovascular: Denies chest pain, palpitations and leg swelling.  Gastrointestinal: Denies nausea, vomiting, abdominal pain, diarrhea, constipation, blood in stool and abdominal distention.  Genitourinary: Denies dysuria, urgency, frequency, hematuria, flank pain and difficulty urinating.  Endocrine: Denies: hot or cold intolerance, sweats, changes in hair or nails, polyuria,  polydipsia. Musculoskeletal: Denies myalgias, back pain, joint swelling, arthralgias and gait problem.  Skin: Denies pallor, rash and wound.  Neurological: Denies dizziness, seizures, syncope, weakness, light-headedness, numbness and headaches.  Hematological: Denies adenopathy. Easy bruising, personal or family bleeding history  Psychiatric/Behavioral: Denies suicidal ideation, mood changes, confusion, nervousness, sleep disturbance and agitation   Past Medical History:  Diagnosis Date  . ALLERGIC RHINITIS 03/04/2008  . Anxiety   . DEPRESSION 03/04/2008  . Edema    pedal  . EXOGENOUS OBESITY 06/22/2010  . 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 Prolactinoma [facility])     Past Surgical History:  Procedure Laterality Date  . BREAST SURGERY  2004   augmentation  . HAMMER TOE SURGERY    . LIPOSUCTION    . NASAL SINUS SURGERY      Family History  Problem Relation Age of Onset  . Arthritis Mother        osteo  . Allergic rhinitis Mother     SOCIAL HX:   reports that she has never smoked. She has never used smokeless tobacco. She reports that she does not drink alcohol or use drugs.   Current Outpatient Medications:  .  azelastine (ASTELIN) 0.1 % nasal spray, Can use two sprays in each nostril twice daily as needed for itchy nose and sneezing., Disp: 30 mL, Rfl: 2 .  Bepotastine Besilate (BEPREVE) 1.5 % SOLN, Can use one drop in each eye twice a day for itchy eyes., Disp: 10 mL, Rfl: 5 .  Black Cohosh-SoyIsoflav-Magnol (ESTROVEN MENOPAUSE RELIEF) CAPS, Take 1 tablet by mouth daily., Disp: , Rfl:  .  ELDERBERRY PO, Take 2 tablets by mouth as needed., Disp: , Rfl:  .  escitalopram (LEXAPRO) 10 MG tablet, Take 1 tablet (10 mg total) by mouth daily., Disp: 30 tablet, Rfl: 2 .  ferrous sulfate (IRON SUPPLEMENT) 325 (65 FE) MG tablet, Take 325 mg by mouth daily with breakfast., Disp: , Rfl:  .  fluticasone (FLONASE) 50 MCG/ACT nasal spray, Place 2 sprays into both nostrils daily., Disp: 18.2  g, Rfl: 2 .  ibuprofen (ADVIL) 200 MG tablet, Take 800 mg by mouth every 8 (eight) hours as needed., Disp: , Rfl:  .  levocetirizine (XYZAL) 5 MG tablet, Take 1 tablet (5 mg total) by mouth every evening., Disp: 90 tablet, Rfl: 1 .  LORazepam (ATIVAN) 0.5 MG tablet, TAKE ONE TABLET BY MOUTH TWICE A DAY AS NEEDED FOR ANXIETY, Disp: 60 tablet, Rfl: 0 .  Multiple Vitamin (MULTI-VITAMINS) TABS, Take by mouth., Disp: , Rfl:  .  ondansetron (ZOFRAN) 4 MG tablet, Take 1 tablet (4 mg total) by mouth every 8 (eight) hours as needed for nausea or vomiting., Disp: 20 tablet, Rfl: 0 .  Probiotic Product (PROBIOTIC-10 PO), Take 1 tablet by mouth daily., Disp: , Rfl:  .  Pyridoxine HCl (VITAMIN B-6 PO), Take by mouth daily., Disp: , Rfl:  .  rizatriptan (MAXALT) 10 MG tablet, TAKE 1 TABLET BY MOUTH AS NEEDED FOR MIGRAINES. MAY REPEAT IN 2 HOURS IF NEEDED *MAX 2 TABLETS IN 24 HOURS*, Disp: 9 tablet, Rfl: 2 .  TURMERIC PO, Take 2 tablets by mouth daily., Disp: , Rfl:  .  venlafaxine XR (EFFEXOR-XR) 150 MG 24 hr capsule, Take 1 capsule (150 mg total) by mouth daily., Disp: 90 capsule, Rfl: 1 .  venlafaxine XR (EFFEXOR-XR) 75 MG 24 hr capsule, Take 1 capsule (75 mg total) by mouth daily., Disp: 90 capsule, Rfl: 3  EXAM:   VITALS per patient if applicable: None reported  GENERAL: alert, oriented, appears well and in no acute distress  HEENT: atraumatic, conjunttiva clear, no obvious abnormalities on inspection of external nose and ears, wears corrective lenses  NECK: normal movements of the head and neck  LUNGS: on inspection no signs of respiratory distress, breathing rate appears normal, no obvious gross increased work of breathing, gasping or wheezing  CV: no obvious cyanosis  MS: moves all visible extremities without noticeable abnormality  PSYCH/NEURO: pleasant and cooperative, no obvious depression or anxiety, speech and thought processing grossly intact  ASSESSMENT AND PLAN:   Depression,  recurrent (HCC) -Continue current medications of Lexapro 10 mg in addition to Effexor or 225 mg daily. -Strongly encouraged her to make CBT appointment. -Will follow-up with her in 3 months.    I discussed the assessment and treatment plan with the patient. The patient was provided an opportunity to ask questions and all were answered. The patient agreed with the plan and demonstrated an understanding of the instructions.   The patient was advised to call back or seek an in-person evaluation if the symptoms worsen or if the condition fails to improve as anticipated.    Lelon Frohlich, MD  [facility] at [facility]

## 2019-03-04 ENCOUNTER — Other Ambulatory Visit: Payer: Self-pay | Admitting: Internal Medicine

## 2019-03-04 DIAGNOSIS — Z8669 Personal history of other diseases of the nervous system and sense organs: Secondary | ICD-10-CM

## 2019-03-05 ENCOUNTER — Ambulatory Visit: Payer: Self-pay | Admitting: Psychology

## 2019-03-08 ENCOUNTER — Ambulatory Visit (INDEPENDENT_AMBULATORY_CARE_PROVIDER_SITE_OTHER): Payer: Self-pay | Admitting: *Deleted

## 2019-03-08 DIAGNOSIS — J309 Allergic rhinitis, unspecified: Secondary | ICD-10-CM

## 2019-03-09 ENCOUNTER — Ambulatory Visit (INDEPENDENT_AMBULATORY_CARE_PROVIDER_SITE_OTHER): Payer: Self-pay | Admitting: Psychology

## 2019-03-09 DIAGNOSIS — F330 Major depressive disorder, recurrent, mild: Secondary | ICD-10-CM

## 2019-03-23 ENCOUNTER — Ambulatory Visit: Payer: Self-pay | Admitting: Psychology

## 2019-03-28 ENCOUNTER — Ambulatory Visit (INDEPENDENT_AMBULATORY_CARE_PROVIDER_SITE_OTHER): Payer: Self-pay | Admitting: *Deleted

## 2019-03-28 ENCOUNTER — Ambulatory Visit: Payer: Self-pay | Admitting: Psychology

## 2019-03-28 DIAGNOSIS — J309 Allergic rhinitis, unspecified: Secondary | ICD-10-CM

## 2019-04-05 ENCOUNTER — Ambulatory Visit (INDEPENDENT_AMBULATORY_CARE_PROVIDER_SITE_OTHER): Payer: Self-pay | Admitting: Psychology

## 2019-04-05 DIAGNOSIS — F330 Major depressive disorder, recurrent, mild: Secondary | ICD-10-CM

## 2019-04-12 ENCOUNTER — Ambulatory Visit (INDEPENDENT_AMBULATORY_CARE_PROVIDER_SITE_OTHER): Payer: Self-pay

## 2019-04-12 DIAGNOSIS — J309 Allergic rhinitis, unspecified: Secondary | ICD-10-CM

## 2019-04-13 ENCOUNTER — Ambulatory Visit (INDEPENDENT_AMBULATORY_CARE_PROVIDER_SITE_OTHER): Payer: Self-pay | Admitting: Psychology

## 2019-04-13 DIAGNOSIS — F330 Major depressive disorder, recurrent, mild: Secondary | ICD-10-CM

## 2019-04-20 ENCOUNTER — Other Ambulatory Visit: Payer: Self-pay | Admitting: *Deleted

## 2019-04-20 DIAGNOSIS — Z20822 Contact with and (suspected) exposure to covid-19: Secondary | ICD-10-CM

## 2019-04-22 ENCOUNTER — Other Ambulatory Visit: Payer: Self-pay | Admitting: Internal Medicine

## 2019-04-22 DIAGNOSIS — F339 Major depressive disorder, recurrent, unspecified: Secondary | ICD-10-CM

## 2019-04-22 LAB — NOVEL CORONAVIRUS, NAA: SARS-CoV-2, NAA: NOT DETECTED

## 2019-04-26 NOTE — Progress Notes (Signed)
VIALS EXP 04-29-20

## 2019-04-30 ENCOUNTER — Ambulatory Visit (INDEPENDENT_AMBULATORY_CARE_PROVIDER_SITE_OTHER): Payer: Self-pay | Admitting: Psychology

## 2019-04-30 DIAGNOSIS — F330 Major depressive disorder, recurrent, mild: Secondary | ICD-10-CM

## 2019-04-30 DIAGNOSIS — J3089 Other allergic rhinitis: Secondary | ICD-10-CM

## 2019-05-09 ENCOUNTER — Ambulatory Visit (INDEPENDENT_AMBULATORY_CARE_PROVIDER_SITE_OTHER): Payer: Self-pay | Admitting: *Deleted

## 2019-05-09 DIAGNOSIS — J309 Allergic rhinitis, unspecified: Secondary | ICD-10-CM

## 2019-05-10 ENCOUNTER — Ambulatory Visit: Payer: Self-pay | Admitting: Psychology

## 2019-05-14 ENCOUNTER — Ambulatory Visit (INDEPENDENT_AMBULATORY_CARE_PROVIDER_SITE_OTHER): Payer: Self-pay | Admitting: Psychology

## 2019-05-14 DIAGNOSIS — F330 Major depressive disorder, recurrent, mild: Secondary | ICD-10-CM

## 2019-05-15 ENCOUNTER — Other Ambulatory Visit: Payer: Self-pay | Admitting: Internal Medicine

## 2019-05-15 DIAGNOSIS — Z8669 Personal history of other diseases of the nervous system and sense organs: Secondary | ICD-10-CM

## 2019-05-30 ENCOUNTER — Ambulatory Visit: Payer: Self-pay | Admitting: Psychology

## 2019-06-05 ENCOUNTER — Ambulatory Visit (INDEPENDENT_AMBULATORY_CARE_PROVIDER_SITE_OTHER): Payer: Self-pay | Admitting: Psychology

## 2019-06-05 DIAGNOSIS — F330 Major depressive disorder, recurrent, mild: Secondary | ICD-10-CM

## 2019-06-06 ENCOUNTER — Ambulatory Visit: Payer: Self-pay | Admitting: Psychology

## 2019-06-06 ENCOUNTER — Ambulatory Visit (INDEPENDENT_AMBULATORY_CARE_PROVIDER_SITE_OTHER): Payer: Self-pay | Admitting: *Deleted

## 2019-06-06 DIAGNOSIS — J309 Allergic rhinitis, unspecified: Secondary | ICD-10-CM

## 2019-06-11 ENCOUNTER — Other Ambulatory Visit: Payer: Self-pay | Admitting: Internal Medicine

## 2019-06-11 DIAGNOSIS — Z8669 Personal history of other diseases of the nervous system and sense organs: Secondary | ICD-10-CM

## 2019-06-12 ENCOUNTER — Ambulatory Visit (INDEPENDENT_AMBULATORY_CARE_PROVIDER_SITE_OTHER): Payer: Self-pay | Admitting: Psychology

## 2019-06-12 DIAGNOSIS — F330 Major depressive disorder, recurrent, mild: Secondary | ICD-10-CM

## 2019-06-13 ENCOUNTER — Ambulatory Visit: Payer: Self-pay | Admitting: Psychology

## 2019-06-19 ENCOUNTER — Ambulatory Visit: Payer: Self-pay | Admitting: Psychology

## 2019-06-20 ENCOUNTER — Ambulatory Visit: Payer: Self-pay | Admitting: Psychology

## 2019-06-21 ENCOUNTER — Ambulatory Visit (INDEPENDENT_AMBULATORY_CARE_PROVIDER_SITE_OTHER): Payer: Self-pay | Admitting: *Deleted

## 2019-06-21 DIAGNOSIS — J309 Allergic rhinitis, unspecified: Secondary | ICD-10-CM

## 2019-06-25 ENCOUNTER — Other Ambulatory Visit: Payer: Self-pay | Admitting: Internal Medicine

## 2019-06-25 DIAGNOSIS — Z8669 Personal history of other diseases of the nervous system and sense organs: Secondary | ICD-10-CM

## 2019-06-26 ENCOUNTER — Ambulatory Visit (INDEPENDENT_AMBULATORY_CARE_PROVIDER_SITE_OTHER): Payer: Self-pay | Admitting: *Deleted

## 2019-06-26 DIAGNOSIS — J309 Allergic rhinitis, unspecified: Secondary | ICD-10-CM

## 2019-06-29 ENCOUNTER — Telehealth: Payer: Self-pay | Admitting: *Deleted

## 2019-06-29 DIAGNOSIS — F339 Major depressive disorder, recurrent, unspecified: Secondary | ICD-10-CM

## 2019-06-29 MED ORDER — VENLAFAXINE HCL ER 150 MG PO CP24: 150.0000 mg | ORAL_CAPSULE | Freq: Every day | ORAL | 1 refills | Status: DC

## 2019-06-29 NOTE — Telephone Encounter (Signed)
Chelsea Dyer [name]  Refill request:  Venlafaxine ER 150 mg  Last refill 08/24/18 Last office visit 02/23/2019  Depression, recurrent ([place]) -Continue current medications of Lexapro 10 mg in addition to Effexor or 225 mg daily. -Strongly encouraged her to make CBT appointment. -Will follow-up with her in 3 months.   Okay to refill?

## 2019-06-29 NOTE — Telephone Encounter (Signed)
yes

## 2019-06-29 NOTE — Telephone Encounter (Signed)
Refill sent.

## 2019-07-02 ENCOUNTER — Ambulatory Visit (INDEPENDENT_AMBULATORY_CARE_PROVIDER_SITE_OTHER): Payer: Self-pay

## 2019-07-02 DIAGNOSIS — J309 Allergic rhinitis, unspecified: Secondary | ICD-10-CM

## 2019-07-19 ENCOUNTER — Telehealth: Payer: Self-pay | Admitting: Allergy

## 2019-07-19 ENCOUNTER — Other Ambulatory Visit: Payer: Self-pay | Admitting: Internal Medicine

## 2019-07-19 DIAGNOSIS — Z8669 Personal history of other diseases of the nervous system and sense organs: Secondary | ICD-10-CM

## 2019-07-19 NOTE — Telephone Encounter (Signed)
I called Chelsea Dyer to set up an office visit for her to continue on her shots; insurance purposes.

## 2019-07-26 ENCOUNTER — Encounter: Payer: Self-pay | Admitting: Family Medicine

## 2019-07-26 ENCOUNTER — Other Ambulatory Visit: Payer: Self-pay

## 2019-07-26 ENCOUNTER — Ambulatory Visit (INDEPENDENT_AMBULATORY_CARE_PROVIDER_SITE_OTHER): Payer: Self-pay | Admitting: Family Medicine

## 2019-07-26 VITALS — BP 132/88 | HR 76 | Temp 97.7°F | Resp 16 | Ht 62.0 in | Wt 186.6 lb

## 2019-07-26 DIAGNOSIS — H1010 Acute atopic conjunctivitis, unspecified eye: Secondary | ICD-10-CM

## 2019-07-26 DIAGNOSIS — J309 Allergic rhinitis, unspecified: Secondary | ICD-10-CM

## 2019-07-26 DIAGNOSIS — J302 Other seasonal allergic rhinitis: Secondary | ICD-10-CM

## 2019-07-26 DIAGNOSIS — J3089 Other allergic rhinitis: Secondary | ICD-10-CM

## 2019-07-26 MED ORDER — EPINEPHRINE 0.3 MG/0.3ML IJ SOAJ: 0.3000 mg | Freq: Once | INTRAMUSCULAR | 2 refills | Status: AC

## 2019-07-26 MED ORDER — PAZEO 0.7 % OP SOLN: 1.0000 [drp] | OPHTHALMIC | 2 refills | Status: DC

## 2019-07-26 NOTE — Patient Instructions (Addendum)
Allergic rhinitis Continue cetirizine 10 mg once a day as needed for a runny nose Continue Flonase 1-2 sprays in each nostril once a day as needed for a stuffy nise Continue azelastine 2 sprays in each nostril twice a day as needed for nasal symptoms or sinus headache Consider saline nasal rinses as needed for nasal symptoms. Use this before any medicated nasal sprays for best result Continue allergen immunotherapy once every 3 weeks after the current build up phase and have access to EpiPen  Allergic conjunctivitis Begin [place] eye drop one drop in each eye once a day as needed for red, itchy eyes  Call the clinic if this treatment plan is not working well for you  Follow up in 1 year or sooner if needed.

## 2019-07-26 NOTE — Progress Notes (Signed)
[address] [place] [address] Dept: [phone]  FOLLOW UP NOTE  Patient ID: Chelsea Dyer, female    DOB: April 03, 1966  Age: 53 y.o. MRN: [id number] Date of Office Visit: 07/26/2019  Assessment  Chief Complaint: Allergic Rhinitis   HPI Chelsea Dyer is a 53 year old female who presents to the clinic for a follow up visit. She was last seen in the clinic on 09/01/2016 by Dr. Nelva Bush for evaluation of allergic rhinitis, allergic conjunctivitis, and migraine syndrome. At today's visit, she reports allergic rhinitis has been well controlled with cetirizine once a day and Flonase daily. She occasionally uses a nasal saline spray. Allergic conjunctivitis is reported as moderately well controlled with an over the counter allergy eye drop a few times a wear. She reports allergen immunotherapy is going well with no local reactions. She reports her symptoms of allergic rhinitis have significantly decreased while continuing allergen immunotherapy. She reports that she does not have an epinephrine device at this time. Her current medications are listed in the chart.    Drug Allergies:  Allergies  Allergen Reactions  . Percocet [Oxycodone-Acetaminophen] Other (See Comments)    Interfered with patient's antidepressants; made pt feel dizzy  . Septra [Sulfamethoxazole-Trimethoprim] Rash    Physical Exam: BP 132/88 (BP Location: Left Arm, Patient Position: Sitting, Cuff Size: Large)   Pulse 76   Temp 97.7 F (36.5 C) (Temporal)   Resp 16   Ht 5\' 2"  (1.575 m)   Wt 186 lb 9.6 oz (84.6 kg)   SpO2 97%   BMI 34.13 kg/m    Physical Exam Vitals signs reviewed.  Constitutional:      Appearance: Normal appearance.  HENT:     Head: Normocephalic and atraumatic.     Right Ear: Tympanic membrane normal.     Left Ear: Tympanic membrane normal.     Nose:     Comments: Bilateral nares slightly erythematous with clear nasal drainage noted. Pharynx normal. Ears normal. Eyes normal.     Mouth/Throat:     Pharynx: Oropharynx is clear.  Eyes:     Conjunctiva/sclera: Conjunctivae normal.  Neck:     Musculoskeletal: Normal range of motion and neck supple.  Cardiovascular:     Rate and Rhythm: Normal rate and regular rhythm.     Heart sounds: No murmur.  Pulmonary:     Effort: Pulmonary effort is normal.     Breath sounds: Normal breath sounds.     Comments: Lungs clear to auscultation Musculoskeletal: Normal range of motion.  Skin:    General: Skin is warm and dry.  Neurological:     Mental Status: She is alert and oriented to person, place, and time.  Psychiatric:        Mood and Affect: Mood normal.        Behavior: Behavior normal.        Thought Content: Thought content normal.        Judgment: Judgment normal.    Assessment and Plan: 1. Seasonal and perennial allergic rhinitis   2. Seasonal allergic conjunctivitis     Meds ordered this encounter  Medications  . Olopatadine HCl (PAZEO) 0.7 % SOLN    Sig: Place 1 drop into both eyes 1 day or 1 dose.    Dispense:  2.5 mL    Refill:  2    Hold. Patient will call when needed. Thank you  . EPINEPHrine (EPIPEN 2-PAK) 0.3 mg/0.3 mL IJ SOAJ injection    Sig: Inject 0.3  mLs (0.3 mg total) into the muscle once for 1 dose.    Dispense:  2 each    Refill:  2    Patient Instructions  Allergic rhinitis Continue cetirizine 10 mg once a day as needed for a runny nose Continue Flonase 1-2 sprays in each nostril once a day as needed for a stuffy nise Continue azelastine 2 sprays in each nostril twice a day as needed for nasal symptoms or sinus headache Consider saline nasal rinses as needed for nasal symptoms. Use this before any medicated nasal sprays for best result Continue allergen immunotherapy once every 3 weeks after the current build up phase and have access to EpiPen  Allergic conjunctivitis Begin Pazeo eye drop one drop in each eye once a day as needed for red, itchy eyes  Call the clinic if this  treatment plan is not working well for you  Follow up in 1 year or sooner if needed.   Return in about 1 year (around 07/25/2020), or if symptoms worsen or fail to improve.    Thank you for the opportunity to care for this patient.  Please do not hesitate to contact me with questions.  Gareth Morgan, FNP [facility]

## 2019-07-30 ENCOUNTER — Other Ambulatory Visit: Payer: Self-pay

## 2019-07-30 DIAGNOSIS — Z20822 Contact with and (suspected) exposure to covid-19: Secondary | ICD-10-CM

## 2019-07-31 ENCOUNTER — Telehealth: Payer: Self-pay

## 2019-07-31 ENCOUNTER — Other Ambulatory Visit: Payer: Self-pay | Admitting: Family Medicine

## 2019-07-31 LAB — NOVEL CORONAVIRUS, NAA: SARS-CoV-2, NAA: NOT DETECTED

## 2019-07-31 MED ORDER — PREDNISONE 10 MG PO TABS: ORAL_TABLET | ORAL | 0 refills | Status: DC

## 2019-07-31 NOTE — Telephone Encounter (Signed)
Patient has been informed of medication regimen. Verbalized understanding and will call back with any further questions.

## 2019-07-31 NOTE — Telephone Encounter (Signed)
Patient is complaining of productive cough with green sputum, green nasal discharge, nausea, body aches, head/face/eye pressure. Denies fever.

## 2019-07-31 NOTE — Telephone Encounter (Signed)
lvm for patient to call back

## 2019-07-31 NOTE — Telephone Encounter (Signed)
Thank you :)

## 2019-07-31 NOTE — Telephone Encounter (Signed)
Please have her continue using Flonase and saline nasal rinses until the result of her covid test returns. Please ask her what symptoms she is having and when they started. Thank you

## 2019-07-31 NOTE — Telephone Encounter (Signed)
Patient is calling back to check on the status of medication distribution. Patient states she normally gets a sinus infection around this time of the year and would like to get relief. Please advise?

## 2019-07-31 NOTE — Telephone Encounter (Signed)
Lonn [place] can you handle

## 2019-07-31 NOTE — Telephone Encounter (Signed)
Called patient. Unable to lvm.

## 2019-07-31 NOTE — Telephone Encounter (Signed)
Patient was seen 07-26-2019 by you. She is now requesting antibiotics be sent in due to sinus infection symptoms. She was tested for COVID-19 yesterday. I did clarify on whether this was due to symptoms and it was not. Please advise and thank you.

## 2019-08-19 ENCOUNTER — Other Ambulatory Visit: Payer: Self-pay | Admitting: Internal Medicine

## 2019-08-19 DIAGNOSIS — Z8669 Personal history of other diseases of the nervous system and sense organs: Secondary | ICD-10-CM

## 2019-09-10 ENCOUNTER — Telehealth (INDEPENDENT_AMBULATORY_CARE_PROVIDER_SITE_OTHER): Payer: Self-pay | Admitting: Family Medicine

## 2019-09-10 ENCOUNTER — Other Ambulatory Visit: Payer: Self-pay | Admitting: Internal Medicine

## 2019-09-10 DIAGNOSIS — Z8669 Personal history of other diseases of the nervous system and sense organs: Secondary | ICD-10-CM

## 2019-09-10 DIAGNOSIS — M25552 Pain in left hip: Secondary | ICD-10-CM

## 2019-09-10 DIAGNOSIS — M25551 Pain in right hip: Secondary | ICD-10-CM

## 2019-09-10 NOTE — Progress Notes (Signed)
Virtual Visit via Video Note  I connected with Chelsea Dyer on 09/10/19 at 11:00 AM EST by a video enabled telemedicine application 2/2 [id number] pandemic and verified that I am speaking with the correct person using two identifiers.  Location patient: home Location provider:work or home office Persons participating in the virtual visit: patient, provider  I discussed the limitations of evaluation and management by telemedicine and the availability of in person appointments. The patient expressed understanding and agreed to proceed.   HPI: Pt is a 53 yo female with pmh sig for anxiety, depression,  Seasonal allergies, h/o prolactinoma followed by Dr. Jerilee Hoh.    Pt with L hip pain x months.  Pain is sharp and at goes to her back at times.  When she sits up or stands too long it hurts.  Denies injury, pops, clicks, tears, edema, fever.  Naproxen and biofreeze help.   Taking Naproxen every other day.  Pt was getting a massage regularly prior to COVID-19.  Wears Dans[employer]'s to work, for h/o plantar fascitis and d/t standing on a hard concrete floor most of the day.   ROS: See pertinent positives and negatives per HPI.  Past Medical History:  Diagnosis Date  . ALLERGIC RHINITIS 03/04/2008  . Anxiety   . DEPRESSION 03/04/2008  . Edema    pedal  . EXOGENOUS OBESITY 06/22/2010  . 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 Prolactinoma [facility])     Past Surgical History:  Procedure Laterality Date  . BREAST SURGERY  2004   augmentation  . HAMMER TOE SURGERY    . LIPOSUCTION    . NASAL SINUS SURGERY      Family History  Problem Relation Age of Onset  . Arthritis Mother        osteo  . Allergic rhinitis Mother      Current Outpatient Medications:  .  azelastine (ASTELIN) 0.1 % nasal spray, Can use two sprays in each nostril twice daily as needed for itchy nose and sneezing. (Patient not taking: Reported on 07/26/2019), Disp: 30 mL, Rfl: 2 .  Bepotastine Besilate (BEPREVE) 1.5 % SOLN, Can use  one drop in each eye twice a day for itchy eyes. (Patient not taking: Reported on 07/26/2019), Disp: 10 mL, Rfl: 5 .  ELDERBERRY PO, Take 2 tablets by mouth as needed., Disp: , Rfl:  .  escitalopram (LEXAPRO) 10 MG tablet, TAKE ONE TABLET BY MOUTH DAILY, Disp: 90 tablet, Rfl: 1 .  ferrous sulfate (IRON SUPPLEMENT) 325 (65 FE) MG tablet, Take 325 mg by mouth daily with breakfast., Disp: , Rfl:  .  fluticasone (FLONASE) 50 MCG/ACT nasal spray, Place 2 sprays into both nostrils daily., Disp: 18.2 g, Rfl: 2 .  ibuprofen (ADVIL) 200 MG tablet, Take 800 mg by mouth every 8 (eight) hours as needed., Disp: , Rfl:  .  levocetirizine (XYZAL) 5 MG tablet, Take 1 tablet (5 mg total) by mouth every evening., Disp: 90 tablet, Rfl: 1 .  LORazepam (ATIVAN) 0.5 MG tablet, TAKE ONE TABLET BY MOUTH TWICE A DAY AS NEEDED FOR ANXIETY, Disp: 60 tablet, Rfl: 0 .  Multiple Vitamin (MULTI-VITAMINS) TABS, Take by mouth., Disp: , Rfl:  .  Olopatadine HCl (PAZEO) 0.7 % SOLN, Place 1 drop into both eyes 1 day or 1 dose., Disp: 2.5 mL, Rfl: 2 .  ondansetron (ZOFRAN) 4 MG tablet, Take 1 tablet (4 mg total) by mouth every 8 (eight) hours as needed for nausea or vomiting., Disp: 20 tablet, Rfl: 0 .  predniSONE (DELTASONE)  10 MG tablet, Take prednisone 10 mg tablets. Take 2 tablets twice a day for 3 days, then take 2 tablets for 1 day, then take 1 tablet on the last day, Disp: 15 tablet, Rfl: 0 .  Probiotic Product (PROBIOTIC-10 PO), Take 1 tablet by mouth daily., Disp: , Rfl:  .  Pyridoxine HCl (VITAMIN B-6 PO), Take by mouth daily., Disp: , Rfl:  .  rizatriptan (MAXALT) 10 MG tablet, TAKE ONE TABLET BY MOUTH AT ONSET OF HEADACHE; MAY REPEAT ONE TABLET IN 2 HOURS IF NEEDED *MAX OF 2 IN 24 HOURS*, Disp: 9 tablet, Rfl: 0 .  TURMERIC PO, Take 2 tablets by mouth daily., Disp: , Rfl:  .  venlafaxine XR (EFFEXOR-XR) 150 MG 24 hr capsule, Take 1 capsule (150 mg total) by mouth daily., Disp: 90 capsule, Rfl: 1 .  venlafaxine XR  (EFFEXOR-XR) 75 MG 24 hr capsule, Take 1 capsule (75 mg total) by mouth daily., Disp: 90 capsule, Rfl: 3  EXAM:  VITALS per patient if applicable:  RR between 12-20 bpm  GENERAL: alert, oriented, appears well and in no acute distress  HEENT: atraumatic, conjunctiva clear, no obvious abnormalities on inspection of external nose and ears  NECK: normal movements of the head and neck  LUNGS: on inspection no signs of respiratory distress, breathing rate appears normal, no obvious gross SOB, gasping or wheezing  CV: no obvious cyanosis  MS: moves all visible extremities without noticeable abnormality  PSYCH/NEURO: pleasant and cooperative, no obvious depression or anxiety, speech and thought processing grossly intact  ASSESSMENT AND PLAN:  Discussed the following assessment and plan:  Hip pain, bilateral  -discussed possible causes including msk strain, arthritis, bursitis -arthritis likely given hx -discussed supportive care: tylenol arthritis strength or Naproxen, heat, massage, stretching, OTC analgesic rubs. -for continued symptoms consider imaging. -pt advised to f/u with pcp in 1-2 wks.  I discussed the assessment and treatment plan with the patient. The patient was provided an opportunity to ask questions and all were answered. The patient agreed with the plan and demonstrated an understanding of the instructions.   The patient was advised to call back or seek an in-person evaluation if the symptoms worsen or if the condition fails to improve as anticipated.   [name], MD

## 2019-09-20 ENCOUNTER — Ambulatory Visit (INDEPENDENT_AMBULATORY_CARE_PROVIDER_SITE_OTHER): Payer: Self-pay

## 2019-09-20 DIAGNOSIS — J309 Allergic rhinitis, unspecified: Secondary | ICD-10-CM

## 2019-09-21 ENCOUNTER — Other Ambulatory Visit: Payer: Self-pay | Admitting: Internal Medicine

## 2019-09-21 DIAGNOSIS — F339 Major depressive disorder, recurrent, unspecified: Secondary | ICD-10-CM

## 2019-10-01 ENCOUNTER — Other Ambulatory Visit: Payer: Self-pay | Admitting: Internal Medicine

## 2019-10-01 DIAGNOSIS — F339 Major depressive disorder, recurrent, unspecified: Secondary | ICD-10-CM

## 2019-10-02 ENCOUNTER — Ambulatory Visit (INDEPENDENT_AMBULATORY_CARE_PROVIDER_SITE_OTHER): Payer: Self-pay

## 2019-10-02 DIAGNOSIS — J309 Allergic rhinitis, unspecified: Secondary | ICD-10-CM

## 2019-10-08 ENCOUNTER — Telehealth (INDEPENDENT_AMBULATORY_CARE_PROVIDER_SITE_OTHER): Payer: Self-pay | Admitting: Family Medicine

## 2019-10-08 DIAGNOSIS — G8929 Other chronic pain: Secondary | ICD-10-CM

## 2019-10-08 DIAGNOSIS — M25552 Pain in left hip: Secondary | ICD-10-CM

## 2019-10-08 DIAGNOSIS — M25551 Pain in right hip: Secondary | ICD-10-CM

## 2019-10-08 NOTE — Progress Notes (Signed)
Virtual Visit via Video Note  I connected with Chelsea Dyer on 10/08/19 at  9:30 AM EST by a video enabled telemedicine application 2/2 [id number] pandemic and verified that I am speaking with the correct person using two identifiers.  Location patient: home Location provider:work or home office Persons participating in the virtual visit: patient, provider  I discussed the limitations of evaluation and management by telemedicine and the availability of in person appointments. The patient expressed understanding and agreed to proceed.   HPI: Pt is a 54 yo female with pmh sig for seasonal allergies, h/o pituitary microadenoma, h/o hyperprolactinemia, depression, and migraines followed by Dr. Jerilee Hoh.  Pt seen for continued chronic hip pain.  Pain in the morning and at night.  At times the muscles in the side of hip feel tight.  Pt does a lot of standing at work.  Tried tylenol arthritis strength which did not help.  Taking Naproxen helps.  And started to take chondroitin sulfate.    ROS: See pertinent positives and negatives per HPI.  Past Medical History:  Diagnosis Date  . ALLERGIC RHINITIS 03/04/2008  . Anxiety   . DEPRESSION 03/04/2008  . Edema    pedal  . EXOGENOUS OBESITY 06/22/2010  . 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 Prolactinoma [facility])     Past Surgical History:  Procedure Laterality Date  . BREAST SURGERY  2004   augmentation  . HAMMER TOE SURGERY    . LIPOSUCTION    . NASAL SINUS SURGERY      Family History  Problem Relation Age of Onset  . Arthritis Mother        osteo  . Allergic rhinitis Mother     Current Outpatient Medications:  .  azelastine (ASTELIN) 0.1 % nasal spray, Can use two sprays in each nostril twice daily as needed for itchy nose and sneezing. (Patient not taking: Reported on 07/26/2019), Disp: 30 mL, Rfl: 2 .  Bepotastine Besilate (BEPREVE) 1.5 % SOLN, Can use one drop in each eye twice a day for itchy eyes. (Patient not taking: Reported on  07/26/2019), Disp: 10 mL, Rfl: 5 .  ELDERBERRY PO, Take 2 tablets by mouth as needed., Disp: , Rfl:  .  escitalopram (LEXAPRO) 10 MG tablet, TAKE ONE TABLET BY MOUTH DAILY, Disp: 30 tablet, Rfl: 0 .  ferrous sulfate (IRON SUPPLEMENT) 325 (65 FE) MG tablet, Take 325 mg by mouth daily with breakfast., Disp: , Rfl:  .  fluticasone (FLONASE) 50 MCG/ACT nasal spray, Place 2 sprays into both nostrils daily., Disp: 18.2 g, Rfl: 2 .  ibuprofen (ADVIL) 200 MG tablet, Take 800 mg by mouth every 8 (eight) hours as needed., Disp: , Rfl:  .  levocetirizine (XYZAL) 5 MG tablet, Take 1 tablet (5 mg total) by mouth every evening., Disp: 90 tablet, Rfl: 1 .  LORazepam (ATIVAN) 0.5 MG tablet, TAKE ONE TABLET BY MOUTH TWICE A DAY AS NEEDED FOR ANXIETY, Disp: 60 tablet, Rfl: 0 .  Multiple Vitamin (MULTI-VITAMINS) TABS, Take by mouth., Disp: , Rfl:  .  Olopatadine HCl (PAZEO) 0.7 % SOLN, Place 1 drop into both eyes 1 day or 1 dose., Disp: 2.5 mL, Rfl: 2 .  ondansetron (ZOFRAN) 4 MG tablet, Take 1 tablet (4 mg total) by mouth every 8 (eight) hours as needed for nausea or vomiting., Disp: 20 tablet, Rfl: 0 .  predniSONE (DELTASONE) 10 MG tablet, Take prednisone 10 mg tablets. Take 2 tablets twice a day for 3 days, then take 2 tablets for 1  day, then take 1 tablet on the last day, Disp: 15 tablet, Rfl: 0 .  Probiotic Product (PROBIOTIC-10 PO), Take 1 tablet by mouth daily., Disp: , Rfl:  .  Pyridoxine HCl (VITAMIN B-6 PO), Take by mouth daily., Disp: , Rfl:  .  rizatriptan (MAXALT) 10 MG tablet, TAKE ONE TABLET BY MOUTH AT ONSET OF HEADACHE; MAY REPEAT ONE TABLET IN 2 HOURS IF NEEDED *MAX OF 2 IN 24 HOURS*, Disp: 9 tablet, Rfl: 0 .  TURMERIC PO, Take 2 tablets by mouth daily., Disp: , Rfl:  .  venlafaxine XR (EFFEXOR-XR) 150 MG 24 hr capsule, Take 1 capsule (150 mg total) by mouth daily., Disp: 90 capsule, Rfl: 1 .  venlafaxine XR (EFFEXOR-XR) 75 MG 24 hr capsule, TAKE ONE CAPSULE BY MOUTH DAILY, Disp: 90 capsule, Rfl:  0  EXAM:  VITALS per patient if applicable: RR between 123456 bpm  GENERAL: alert, oriented, appears well and in no acute distress  HEENT: atraumatic, conjunctiva clear, no obvious abnormalities on inspection of external nose and ears  NECK: normal movements of the head and neck  LUNGS: on inspection no signs of respiratory distress, breathing rate appears normal, no obvious gross SOB, gasping or wheezing  CV: no obvious cyanosis  MS: moves all visible extremities without noticeable abnormality  PSYCH/NEURO: pleasant and cooperative, no obvious depression or anxiety, speech and thought processing grossly intact  ASSESSMENT AND PLAN:  Discussed the following assessment and plan:  Chronic hip pain, bilateral -continued pain.  Concern for arthritis -continue supportive care -will try OTC Voltaren gel prn -discussed obtaining imaging.  Clinic xray unavailable today and tomorrow. - Plan: DG HIPS BILAT W OR W/O PELVIS 2V  F/u prn with pcp   I discussed the assessment and treatment plan with the patient. The patient was provided an opportunity to ask questions and all were answered. The patient agreed with the plan and demonstrated an understanding of the instructions.   The patient was advised to call back or seek an in-person evaluation if the symptoms worsen or if the condition fails to improve as anticipated.   [name], MD

## 2019-10-12 ENCOUNTER — Other Ambulatory Visit: Payer: Self-pay

## 2019-10-15 ENCOUNTER — Telehealth: Payer: Self-pay | Admitting: Internal Medicine

## 2019-10-15 NOTE — Telephone Encounter (Signed)
Pharmacy called pt and informed her that she will need an appt to refill her medication. Pt's last visit was a Virtual on 10/08/19   Medication Refill:  Escitalopram [facility]: [facility] [address] [place], [place] [address]  Pt can be reached at [phone]

## 2019-10-16 NOTE — Telephone Encounter (Signed)
Spoke with patient and an appointment scheduled 

## 2019-10-17 ENCOUNTER — Telehealth: Payer: Self-pay | Admitting: Internal Medicine

## 2019-10-17 ENCOUNTER — Other Ambulatory Visit: Payer: Self-pay

## 2019-10-17 ENCOUNTER — Ambulatory Visit (INDEPENDENT_AMBULATORY_CARE_PROVIDER_SITE_OTHER): Payer: Self-pay

## 2019-10-17 DIAGNOSIS — G8929 Other chronic pain: Secondary | ICD-10-CM

## 2019-10-17 DIAGNOSIS — M25552 Pain in left hip: Secondary | ICD-10-CM

## 2019-10-17 DIAGNOSIS — F419 Anxiety disorder, unspecified: Secondary | ICD-10-CM

## 2019-10-17 DIAGNOSIS — M25551 Pain in right hip: Secondary | ICD-10-CM

## 2019-10-17 DIAGNOSIS — Z8669 Personal history of other diseases of the nervous system and sense organs: Secondary | ICD-10-CM

## 2019-10-18 MED ORDER — LORAZEPAM 0.5 MG PO TABS: ORAL_TABLET | ORAL | 0 refills | Status: DC

## 2019-10-18 NOTE — Addendum Note (Signed)
Addended by: Apolinar Junes on: 10/18/2019 07:38 AM   Modules accepted: Orders

## 2019-10-18 NOTE — Telephone Encounter (Signed)
Okay to refill LORazepam (ATIVAN) 0.5 MG tablet?

## 2019-10-19 ENCOUNTER — Ambulatory Visit: Payer: PRIVATE HEALTH INSURANCE | Attending: Internal Medicine

## 2019-10-19 DIAGNOSIS — Z20822 Contact with and (suspected) exposure to covid-19: Secondary | ICD-10-CM

## 2019-10-20 LAB — NOVEL CORONAVIRUS, NAA: SARS-CoV-2, NAA: NOT DETECTED

## 2019-10-23 ENCOUNTER — Telehealth: Payer: Self-pay | Admitting: Internal Medicine

## 2019-10-25 ENCOUNTER — Ambulatory Visit (INDEPENDENT_AMBULATORY_CARE_PROVIDER_SITE_OTHER): Payer: Self-pay

## 2019-10-25 DIAGNOSIS — J309 Allergic rhinitis, unspecified: Secondary | ICD-10-CM

## 2019-10-26 ENCOUNTER — Telehealth (INDEPENDENT_AMBULATORY_CARE_PROVIDER_SITE_OTHER): Payer: Self-pay | Admitting: Internal Medicine

## 2019-10-26 ENCOUNTER — Other Ambulatory Visit: Payer: Self-pay

## 2019-10-26 DIAGNOSIS — M25551 Pain in right hip: Secondary | ICD-10-CM

## 2019-10-26 DIAGNOSIS — F419 Anxiety disorder, unspecified: Secondary | ICD-10-CM

## 2019-10-26 DIAGNOSIS — M25552 Pain in left hip: Secondary | ICD-10-CM

## 2019-10-26 DIAGNOSIS — G8929 Other chronic pain: Secondary | ICD-10-CM

## 2019-10-26 DIAGNOSIS — F339 Major depressive disorder, recurrent, unspecified: Secondary | ICD-10-CM

## 2019-10-26 MED ORDER — VENLAFAXINE HCL ER 150 MG PO CP24: 150.0000 mg | ORAL_CAPSULE | Freq: Every day | ORAL | 1 refills | Status: DC

## 2019-10-26 MED ORDER — ESCITALOPRAM OXALATE 10 MG PO TABS: 10.0000 mg | ORAL_TABLET | Freq: Every day | ORAL | 1 refills | Status: DC

## 2019-10-26 MED ORDER — LORAZEPAM 0.5 MG PO TABS: ORAL_TABLET | ORAL | 0 refills | Status: DC

## 2019-10-26 MED ORDER — VENLAFAXINE HCL ER 75 MG PO CP24: 75.0000 mg | ORAL_CAPSULE | Freq: Every day | ORAL | 1 refills | Status: DC

## 2019-10-26 NOTE — Progress Notes (Signed)
Virtual Visit via Video Note  I connected with Chelsea Dyer on 10/26/19 at  2:30 PM EST by a video enabled telemedicine application and verified that I am speaking with the correct person using two identifiers.  Location patient: home Location provider: work office Persons participating in the virtual visit: patient, provider  I discussed the limitations of evaluation and management by telemedicine and the availability of in person appointments. The patient expressed understanding and agreed to proceed.   HPI: She is here today mainly to follow-up on her depression.  She has been doing well with addition of Lexapro to her Effexor.  She still has times when she feels depressed.  She has seen another provider in the office a couple times for hip pain, this has improved since wearing more supportive footwear and taking Aleve.  Other than that she has no complaints today.   ROS: Constitutional: Denies fever, chills, diaphoresis, appetite change and fatigue.  HEENT: Denies photophobia, eye pain, redness, hearing loss, ear pain, congestion, sore throat, rhinorrhea, sneezing, mouth sores, trouble swallowing, neck pain, neck stiffness and tinnitus.   Respiratory: Denies SOB, DOE, cough, chest tightness,  and wheezing.   Cardiovascular: Denies chest pain, palpitations and leg swelling.  Gastrointestinal: Denies nausea, vomiting, abdominal pain, diarrhea, constipation, blood in stool and abdominal distention.  Genitourinary: Denies dysuria, urgency, frequency, hematuria, flank pain and difficulty urinating.  Endocrine: Denies: hot or cold intolerance, sweats, changes in hair or nails, polyuria, polydipsia. Musculoskeletal: Denies myalgias, back pain, joint swelling, arthralgias and gait problem.  Skin: Denies pallor, rash and wound.  Neurological: Denies dizziness, seizures, syncope, weakness, light-headedness, numbness and headaches.  Hematological: Denies adenopathy. Easy bruising, personal  or family bleeding history  Psychiatric/Behavioral: Denies suicidal ideation, mood changes, confusion, nervousness, sleep disturbance and agitation   Past Medical History:  Diagnosis Date  . ALLERGIC RHINITIS 03/04/2008  . Anxiety   . DEPRESSION 03/04/2008  . Edema    pedal  . EXOGENOUS OBESITY 06/22/2010  . 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 Prolactinoma [facility])     Past Surgical History:  Procedure Laterality Date  . BREAST SURGERY  2004   augmentation  . HAMMER TOE SURGERY    . LIPOSUCTION    . NASAL SINUS SURGERY      Family History  Problem Relation Age of Onset  . Arthritis Mother        osteo  . Allergic rhinitis Mother     SOCIAL HX:   reports that she has never smoked. She has never used smokeless tobacco. She reports that she does not drink alcohol or use drugs.   Current Outpatient Medications:  .  ELDERBERRY PO, Take 2 tablets by mouth as needed., Disp: , Rfl:  .  escitalopram (LEXAPRO) 10 MG tablet, TAKE ONE TABLET BY MOUTH DAILY, Disp: 30 tablet, Rfl: 0 .  ferrous sulfate (IRON SUPPLEMENT) 325 (65 FE) MG tablet, Take 325 mg by mouth daily with breakfast., Disp: , Rfl:  .  fluticasone (FLONASE) 50 MCG/ACT nasal spray, Place 2 sprays into both nostrils daily., Disp: 18.2 g, Rfl: 2 .  ibuprofen (ADVIL) 200 MG tablet, Take 800 mg by mouth every 8 (eight) hours as needed., Disp: , Rfl:  .  levocetirizine (XYZAL) 5 MG tablet, Take 1 tablet (5 mg total) by mouth every evening., Disp: 90 tablet, Rfl: 1 .  LORazepam (ATIVAN) 0.5 MG tablet, TAKE ONE TABLET BY MOUTH TWICE A DAY AS NEEDED FOR ANXIETY, Disp: 60 tablet, Rfl: 0 .  Multiple  Vitamin (MULTI-VITAMINS) TABS, Take by mouth., Disp: , Rfl:  .  Probiotic Product (PROBIOTIC-10 PO), Take 1 tablet by mouth daily., Disp: , Rfl:  .  Pyridoxine HCl (VITAMIN B-6 PO), Take by mouth daily., Disp: , Rfl:  .  rizatriptan (MAXALT) 10 MG tablet, TAKE 1 TABLET BY MOUTH AT ONSET OF HEADACHE. MAY REPEAT 1 TABLET IN 2 HOURS IF  NEEDED *MAX 2 TABLETS PER 24 HOURS*, Disp: 9 tablet, Rfl: 0 .  TURMERIC PO, Take 2 tablets by mouth daily., Disp: , Rfl:  .  venlafaxine XR (EFFEXOR-XR) 150 MG 24 hr capsule, Take 1 capsule (150 mg total) by mouth daily., Disp: 90 capsule, Rfl: 1 .  venlafaxine XR (EFFEXOR-XR) 75 MG 24 hr capsule, TAKE ONE CAPSULE BY MOUTH DAILY, Disp: 90 capsule, Rfl: 0 .  azelastine (ASTELIN) 0.1 % nasal spray, Can use two sprays in each nostril twice daily as needed for itchy nose and sneezing. (Patient not taking: Reported on 07/26/2019), Disp: 30 mL, Rfl: 2 .  Bepotastine Besilate (BEPREVE) 1.5 % SOLN, Can use one drop in each eye twice a day for itchy eyes. (Patient not taking: Reported on 07/26/2019), Disp: 10 mL, Rfl: 5 .  Olopatadine HCl (PAZEO) 0.7 % SOLN, Place 1 drop into both eyes 1 day or 1 dose. (Patient not taking: Reported on 10/26/2019), Disp: 2.5 mL, Rfl: 2 .  ondansetron (ZOFRAN) 4 MG tablet, Take 1 tablet (4 mg total) by mouth every 8 (eight) hours as needed for nausea or vomiting. (Patient not taking: Reported on 10/26/2019), Disp: 20 tablet, Rfl: 0 .  predniSONE (DELTASONE) 10 MG tablet, Take prednisone 10 mg tablets. Take 2 tablets twice a day for 3 days, then take 2 tablets for 1 day, then take 1 tablet on the last day (Patient not taking: Reported on 10/26/2019), Disp: 15 tablet, Rfl: 0  EXAM:   VITALS per patient if applicable: None reported  GENERAL: alert, oriented, appears well and in no acute distress  HEENT: atraumatic, conjunttiva clear, no obvious abnormalities on inspection of external nose and ears  NECK: normal movements of the head and neck  LUNGS: on inspection no signs of respiratory distress, breathing rate appears normal, no obvious gross increased work of breathing, gasping or wheezing  CV: no obvious cyanosis  MS: moves all visible extremities without noticeable abnormality  PSYCH/NEURO: pleasant and cooperative, no obvious depression or anxiety, speech and thought  processing grossly intact  ASSESSMENT AND PLAN:   Depression, recurrent ([place]) -Improved. -Check PHQ-9 at next in office visit. -Refill Effexor, Lexapro, Ativan.  Chronic hip pain, bilateral -Controlled on NSAIDs.      I discussed the assessment and treatment plan with the patient. The patient was provided an opportunity to ask questions and all were answered. The patient agreed with the plan and demonstrated an understanding of the instructions.   The patient was advised to call back or seek an in-person evaluation if the symptoms worsen or if the condition fails to improve as anticipated.    Lelon Frohlich, MD  [facility]

## 2019-10-28 ENCOUNTER — Other Ambulatory Visit: Payer: Self-pay | Admitting: Internal Medicine

## 2019-10-28 DIAGNOSIS — F339 Major depressive disorder, recurrent, unspecified: Secondary | ICD-10-CM

## 2019-11-02 ENCOUNTER — Other Ambulatory Visit: Payer: Self-pay | Admitting: Internal Medicine

## 2019-11-02 DIAGNOSIS — Z8669 Personal history of other diseases of the nervous system and sense organs: Secondary | ICD-10-CM

## 2019-11-14 ENCOUNTER — Ambulatory Visit (INDEPENDENT_AMBULATORY_CARE_PROVIDER_SITE_OTHER): Payer: Self-pay

## 2019-11-14 DIAGNOSIS — J309 Allergic rhinitis, unspecified: Secondary | ICD-10-CM

## 2019-11-22 ENCOUNTER — Ambulatory Visit (INDEPENDENT_AMBULATORY_CARE_PROVIDER_SITE_OTHER): Payer: Self-pay

## 2019-11-22 DIAGNOSIS — J309 Allergic rhinitis, unspecified: Secondary | ICD-10-CM

## 2019-11-28 ENCOUNTER — Ambulatory Visit (INDEPENDENT_AMBULATORY_CARE_PROVIDER_SITE_OTHER): Payer: Self-pay

## 2019-11-28 DIAGNOSIS — J309 Allergic rhinitis, unspecified: Secondary | ICD-10-CM

## 2019-12-04 ENCOUNTER — Other Ambulatory Visit: Payer: Self-pay

## 2019-12-05 ENCOUNTER — Ambulatory Visit (INDEPENDENT_AMBULATORY_CARE_PROVIDER_SITE_OTHER): Payer: PRIVATE HEALTH INSURANCE | Admitting: Internal Medicine

## 2019-12-05 ENCOUNTER — Encounter: Payer: Self-pay | Admitting: Internal Medicine

## 2019-12-05 VITALS — BP 110/80 | HR 75 | Temp 97.3°F | Wt 189.3 lb

## 2019-12-05 DIAGNOSIS — F339 Major depressive disorder, recurrent, unspecified: Secondary | ICD-10-CM

## 2019-12-05 NOTE — Patient Instructions (Signed)
-  Nice seeing you today!!  -Psychiatry referral today.  -Schedule follow up for your physical in 3-6 months after you have touched based with psychiatry.   Major Depressive Disorder, Adult Major depressive disorder (MDD) is a mental health condition. MDD often makes you feel sad, hopeless, or helpless. MDD can also cause symptoms in your body. MDD can affect your:  Work.  School.  Relationships.  Other normal activities. MDD can range from mild to very bad. It may occur once (single episode MDD). It can also occur many times (recurrent MDD). The main symptoms of MDD often include:  Feeling sad, depressed, or irritable most of the time.  Loss of interest. MDD symptoms also include:  Sleeping too much or too little.  Eating too much or too little.  A change in your weight.  Feeling tired (fatigue) or having low energy.  Feeling worthless.  Feeling guilty.  Trouble making decisions.  Trouble thinking clearly.  Thoughts of suicide or harming others.  Feeling weak.  Feeling agitated.  Keeping yourself from being around other people (isolation). Follow these instructions at home: Activity  Do these things as told by your doctor: ? Go back to your normal activities. ? Exercise regularly. ? Spend time outdoors. Alcohol  Talk with your doctor about how alcohol can affect your antidepressant medicines.  Do not drink alcohol. Or, limit how much alcohol you drink. ? This means no more than 1 drink a day for nonpregnant women and 2 drinks a day for men. One drink equals one of these:  12 oz of beer.  5 oz of wine.  1 oz of hard liquor. General instructions  Take over-the-counter and prescription medicines only as told by your doctor.  Eat a healthy diet.  Get plenty of sleep.  Find activities that you enjoy. Make time to do them.  Think about joining a support group. Your doctor may be able to suggest a group for you.  Keep all follow-up visits as  told by your doctor. This is important. Where to find more information:  [employer] on Mental Illness: ? www.nami.[place]: ? https://carter.com/  National Suicide Prevention Lifeline: ? (406)554-6398. This is free, 24-hour help. Contact a doctor if:  Your symptoms get worse.  You have new symptoms. Get help right away if:  You self-harm.  You see, hear, taste, smell, or feel things that are not present (hallucinate). If you ever feel like you may hurt yourself or others, or have thoughts about taking your own life, get help right away. You can go to your nearest emergency department or call:  Your local emergency services (911 in the [place]  A suicide crisis helpline, such as the National Suicide Prevention Lifeline: ? 563 814 0656. This is open 24 hours a day. This information is not intended to replace advice given to you by your health care provider. Make sure you discuss any questions you have with your health care provider. Document Revised: 08/12/2017 Document Reviewed: 05/16/2016 Elsevier Patient Education  2020 [employer].

## 2019-12-05 NOTE — Progress Notes (Signed)
Established Patient Office Visit     This visit occurred during the SARS-CoV-2 public health emergency.  Safety protocols were in place, including screening questions prior to the visit, additional usage of staff PPE, and extensive cleaning of exam room while observing appropriate contact time as indicated for disinfecting solutions.    CC/Reason for Visit: Follow-up depression  HPI: Chelsea Dyer is a 54 y.o. female who is coming in today for the above mentioned reasons.  She is here today to follow-up on her depression.  She is on Effexor 225 mg and Lexapro 10 mg.  She is very tearful at today's visit.  She shows me a text stream with her husband's caregiver, where the caregiver mentions that she is always teetering on the edge of depression, sad, crying.  She denies suicidal ideation or intent, she is having trouble sleeping.  She is compliant with medications.  She tried 6 weeks of CBT sessions but quit, she felt it was a "waste of time".  She did not "mesh" with her counselor.   Past Medical/Surgical History: Past Medical History:  Diagnosis Date  . ALLERGIC RHINITIS 03/04/2008  . Anxiety   . DEPRESSION 03/04/2008  . Edema    pedal  . EXOGENOUS OBESITY 06/22/2010  . 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 Prolactinoma [facility])     Past Surgical History:  Procedure Laterality Date  . BREAST SURGERY  2004   augmentation  . HAMMER TOE SURGERY    . LIPOSUCTION    . NASAL SINUS SURGERY      Social History:  reports that she has never smoked. She has never used smokeless tobacco. She reports that she does not drink alcohol or use drugs.  Allergies: Allergies  Allergen Reactions  . Percocet [Oxycodone-Acetaminophen] Other (See Comments)    Interfered with patient's antidepressants; made pt feel dizzy  . Septra [Sulfamethoxazole-Trimethoprim] Rash    Family History:  Family History  Problem Relation Age of Onset  . Arthritis Mother        osteo  . Allergic rhinitis  Mother      Current Outpatient Medications:  .  azelastine (ASTELIN) 0.1 % nasal spray, Can use two sprays in each nostril twice daily as needed for itchy nose and sneezing., Disp: 30 mL, Rfl: 2 .  Bepotastine Besilate (BEPREVE) 1.5 % SOLN, Can use one drop in each eye twice a day for itchy eyes., Disp: 10 mL, Rfl: 5 .  ELDERBERRY PO, Take 2 tablets by mouth as needed., Disp: , Rfl:  .  escitalopram (LEXAPRO) 10 MG tablet, TAKE ONE TABLET BY MOUTH DAILY *NEED OFFICE VISIT FOR FURTHER REFILLS*, Disp: 90 tablet, Rfl: 1 .  ferrous sulfate (IRON SUPPLEMENT) 325 (65 FE) MG tablet, Take 325 mg by mouth daily with breakfast., Disp: , Rfl:  .  fluticasone (FLONASE) 50 MCG/ACT nasal spray, Place 2 sprays into both nostrils daily., Disp: 18.2 g, Rfl: 2 .  ibuprofen (ADVIL) 200 MG tablet, Take 800 mg by mouth every 8 (eight) hours as needed., Disp: , Rfl:  .  levocetirizine (XYZAL) 5 MG tablet, Take 1 tablet (5 mg total) by mouth every evening., Disp: 90 tablet, Rfl: 1 .  LORazepam (ATIVAN) 0.5 MG tablet, TAKE ONE TABLET BY MOUTH TWICE A DAY AS NEEDED FOR ANXIETY, Disp: 60 tablet, Rfl: 0 .  Multiple Vitamin (MULTI-VITAMINS) TABS, Take by mouth., Disp: , Rfl:  .  Olopatadine HCl (PAZEO) 0.7 % SOLN, Place 1 drop into both eyes 1 day or 1  dose., Disp: 2.5 mL, Rfl: 2 .  ondansetron (ZOFRAN) 4 MG tablet, Take 1 tablet (4 mg total) by mouth every 8 (eight) hours as needed for nausea or vomiting., Disp: 20 tablet, Rfl: 0 .  predniSONE (DELTASONE) 10 MG tablet, Take prednisone 10 mg tablets. Take 2 tablets twice a day for 3 days, then take 2 tablets for 1 day, then take 1 tablet on the last day, Disp: 15 tablet, Rfl: 0 .  Probiotic Product (PROBIOTIC-10 PO), Take 1 tablet by mouth daily., Disp: , Rfl:  .  Pyridoxine HCl (VITAMIN B-6 PO), Take by mouth daily., Disp: , Rfl:  .  rizatriptan (MAXALT) 10 MG tablet, TAKE 1 TABLET BY MOUTH AT ONSET OF HEADACHE. MAY REPEAT 1 TABLET IN 2 HOURS IF NEEDED *MAX 2 TABLETS PER  24 HOURS*, Disp: 9 tablet, Rfl: 1 .  TURMERIC PO, Take 2 tablets by mouth daily., Disp: , Rfl:  .  venlafaxine XR (EFFEXOR-XR) 150 MG 24 hr capsule, Take 1 capsule (150 mg total) by mouth daily., Disp: 90 capsule, Rfl: 1 .  venlafaxine XR (EFFEXOR-XR) 75 MG 24 hr capsule, Take 1 capsule (75 mg total) by mouth daily., Disp: 90 capsule, Rfl: 1  Review of Systems:  Constitutional: Denies fever, chills, diaphoresis, appetite change and fatigue.  HEENT: Denies photophobia, eye pain, redness, hearing loss, ear pain, congestion, sore throat, rhinorrhea, sneezing, mouth sores, trouble swallowing, neck pain, neck stiffness and tinnitus.   Respiratory: Denies SOB, DOE, cough, chest tightness,  and wheezing.   Cardiovascular: Denies chest pain, palpitations and leg swelling.  Gastrointestinal: Denies nausea, vomiting, abdominal pain, diarrhea, constipation, blood in stool and abdominal distention.  Genitourinary: Denies dysuria, urgency, frequency, hematuria, flank pain and difficulty urinating.  Endocrine: Denies: hot or cold intolerance, sweats, changes in hair or nails, polyuria, polydipsia. Musculoskeletal: Denies myalgias, back pain, joint swelling, arthralgias and gait problem.  Skin: Denies pallor, rash and wound.  Neurological: Denies dizziness, seizures, syncope, weakness, light-headedness, numbness and headaches.  Hematological: Denies adenopathy. Easy bruising, personal or family bleeding history  Psychiatric/Behavioral: Denies suicidal ideation.   Physical Exam: Vitals:   12/05/19 1004  BP: 110/80  Pulse: 75  Temp: (!) 97.3 F (36.3 C)  TempSrc: Temporal  SpO2: 98%  Weight: 189 lb 4.8 oz (85.9 kg)    Body mass index is 34.62 kg/m.   Constitutional: NAD, calm, comfortable Eyes: PERRL, lids and conjunctivae normal, wears corrective lenses ENMT: Mucous membranes are moist. Respiratory: clear to auscultation bilaterally, no wheezing, no crackles. Normal respiratory effort. No  accessory muscle use.  Cardiovascular: Regular rate and rhythm, no murmurs / rubs / gallops. No extremity edema.  Neurologic: Grossly intact and nonfocal Psychiatric: Normal judgment and insight. Alert and oriented x 3.  Mood is depressed and tearful   Impression and Plan:  Depression, recurrent ([place])     Office Visit from 12/05/2019 in [facility]  PHQ-9 Total Score  9    -At this point I believe it is time for psychiatry referral. -She will continue taking current medications. -She has no suicidal ideation, have given her the suicide prevention hotline number. -I have encouraged her to continue CBT sessions.   Patient Instructions  -Nice seeing you today!!  -Psychiatry referral today.  -Schedule follow up for your physical in 3-6 months after you have touched based with psychiatry.   Major Depressive Disorder, Adult Major depressive disorder (MDD) is a mental health condition. MDD often makes you feel sad, hopeless, or helpless. MDD can  also cause symptoms in your body. MDD can affect your:  Work.  School.  Relationships.  Other normal activities. MDD can range from mild to very bad. It may occur once (single episode MDD). It can also occur many times (recurrent MDD). The main symptoms of MDD often include:  Feeling sad, depressed, or irritable most of the time.  Loss of interest. MDD symptoms also include:  Sleeping too much or too little.  Eating too much or too little.  A change in your weight.  Feeling tired (fatigue) or having low energy.  Feeling worthless.  Feeling guilty.  Trouble making decisions.  Trouble thinking clearly.  Thoughts of suicide or harming others.  Feeling weak.  Feeling agitated.  Keeping yourself from being around other people (isolation). Follow these instructions at home: Activity  Do these things as told by your doctor: ? Go back to your normal activities. ? Exercise regularly. ? Spend time  outdoors. Alcohol  Talk with your doctor about how alcohol can affect your antidepressant medicines.  Do not drink alcohol. Or, limit how much alcohol you drink. ? This means no more than 1 drink a day for nonpregnant women and 2 drinks a day for men. One drink equals one of these:  12 oz of beer.  5 oz of wine.  1 oz of hard liquor. General instructions  Take over-the-counter and prescription medicines only as told by your doctor.  Eat a healthy diet.  Get plenty of sleep.  Find activities that you enjoy. Make time to do them.  Think about joining a support group. Your doctor may be able to suggest a group for you.  Keep all follow-up visits as told by your doctor. This is important. Where to find more information:  [employer] on Mental Illness: ? www.nami.[place]: ? https://carter.com/  National Suicide Prevention Lifeline: ? 9075767540. This is free, 24-hour help. Contact a doctor if:  Your symptoms get worse.  You have new symptoms. Get help right away if:  You self-harm.  You see, hear, taste, smell, or feel things that are not present (hallucinate). If you ever feel like you may hurt yourself or others, or have thoughts about taking your own life, get help right away. You can go to your nearest emergency department or call:  Your local emergency services (911 in the [place]  A suicide crisis helpline, such as the National Suicide Prevention Lifeline: ? 509 882 1899. This is open 24 hours a day. This information is not intended to replace advice given to you by your health care provider. Make sure you discuss any questions you have with your health care provider. Document Revised: 08/12/2017 Document Reviewed: 05/16/2016 Elsevier Patient Education  2020 [place], MD [facility]

## 2019-12-15 ENCOUNTER — Other Ambulatory Visit: Payer: Self-pay | Admitting: Internal Medicine

## 2019-12-15 DIAGNOSIS — Z8669 Personal history of other diseases of the nervous system and sense organs: Secondary | ICD-10-CM

## 2019-12-19 ENCOUNTER — Ambulatory Visit (INDEPENDENT_AMBULATORY_CARE_PROVIDER_SITE_OTHER): Payer: Self-pay | Admitting: *Deleted

## 2019-12-19 DIAGNOSIS — J309 Allergic rhinitis, unspecified: Secondary | ICD-10-CM

## 2019-12-28 ENCOUNTER — Ambulatory Visit (INDEPENDENT_AMBULATORY_CARE_PROVIDER_SITE_OTHER): Payer: Self-pay

## 2019-12-28 DIAGNOSIS — J309 Allergic rhinitis, unspecified: Secondary | ICD-10-CM

## 2020-01-02 ENCOUNTER — Other Ambulatory Visit: Payer: Self-pay | Admitting: Internal Medicine

## 2020-01-02 DIAGNOSIS — Z8669 Personal history of other diseases of the nervous system and sense organs: Secondary | ICD-10-CM

## 2020-01-11 ENCOUNTER — Ambulatory Visit (HOSPITAL_COMMUNITY): Payer: Self-pay | Admitting: Psychiatry

## 2020-01-11 ENCOUNTER — Telehealth: Payer: Self-pay | Admitting: Internal Medicine

## 2020-01-11 NOTE — Telephone Encounter (Signed)
Should this come from neurology?

## 2020-01-11 NOTE — Telephone Encounter (Signed)
Patient is needing a letter stating why she was on bromocriptine (PARLODEL) 2.5 MG tablet  and when she ended it.  She would like for you to send it to her MyChart when it is completed.  Please advise

## 2020-01-14 NOTE — Telephone Encounter (Signed)
I am not sure. I was just taking the message.

## 2020-01-16 ENCOUNTER — Encounter: Payer: Self-pay | Admitting: Psychiatry

## 2020-01-16 ENCOUNTER — Ambulatory Visit (INDEPENDENT_AMBULATORY_CARE_PROVIDER_SITE_OTHER): Payer: PRIVATE HEALTH INSURANCE | Admitting: Psychiatry

## 2020-01-16 ENCOUNTER — Other Ambulatory Visit: Payer: Self-pay

## 2020-01-16 DIAGNOSIS — F419 Anxiety disorder, unspecified: Secondary | ICD-10-CM | POA: Diagnosis not present

## 2020-01-16 DIAGNOSIS — F339 Major depressive disorder, recurrent, unspecified: Secondary | ICD-10-CM | POA: Diagnosis not present

## 2020-01-16 MED ORDER — VENLAFAXINE HCL ER 150 MG PO CP24: 300.0000 mg | ORAL_CAPSULE | Freq: Every day | ORAL | 0 refills | Status: DC

## 2020-01-16 MED ORDER — LORAZEPAM 0.5 MG PO TABS: ORAL_TABLET | ORAL | 0 refills | Status: DC
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
## 2020-01-16 NOTE — Telephone Encounter (Signed)
Spoke with patient and informed her that per Dr Jerilee Hoh, because she did not prescribe or refill this medication, she does not feel comfortable writing the letter.

## 2020-01-24 ENCOUNTER — Ambulatory Visit (INDEPENDENT_AMBULATORY_CARE_PROVIDER_SITE_OTHER): Payer: Self-pay

## 2020-01-24 DIAGNOSIS — J309 Allergic rhinitis, unspecified: Secondary | ICD-10-CM

## 2020-01-28 ENCOUNTER — Ambulatory Visit (INDEPENDENT_AMBULATORY_CARE_PROVIDER_SITE_OTHER): Payer: Self-pay

## 2020-01-28 DIAGNOSIS — J309 Allergic rhinitis, unspecified: Secondary | ICD-10-CM

## 2020-02-05 ENCOUNTER — Ambulatory Visit (INDEPENDENT_AMBULATORY_CARE_PROVIDER_SITE_OTHER): Payer: Self-pay

## 2020-02-05 DIAGNOSIS — J309 Allergic rhinitis, unspecified: Secondary | ICD-10-CM

## 2020-02-13 ENCOUNTER — Ambulatory Visit (INDEPENDENT_AMBULATORY_CARE_PROVIDER_SITE_OTHER): Payer: Self-pay

## 2020-02-13 ENCOUNTER — Encounter: Payer: Self-pay | Admitting: Psychiatry

## 2020-02-13 ENCOUNTER — Ambulatory Visit (INDEPENDENT_AMBULATORY_CARE_PROVIDER_SITE_OTHER): Payer: PRIVATE HEALTH INSURANCE | Admitting: Psychiatry

## 2020-02-13 ENCOUNTER — Other Ambulatory Visit: Payer: Self-pay

## 2020-02-13 DIAGNOSIS — J309 Allergic rhinitis, unspecified: Secondary | ICD-10-CM

## 2020-02-13 DIAGNOSIS — F339 Major depressive disorder, recurrent, unspecified: Secondary | ICD-10-CM

## 2020-02-13 DIAGNOSIS — F419 Anxiety disorder, unspecified: Secondary | ICD-10-CM

## 2020-02-13 MED ORDER — BUSPIRONE HCL 15 MG PO TABS: ORAL_TABLET | ORAL | 1 refills | Status: DC

## 2020-02-13 NOTE — Progress Notes (Signed)
Chelsea Dyer [id number] 07-24-1966 54 y.o.  Subjective:   Patient ID:  Chelsea Dyer is a 54 y.o. (DOB Aug 08, 1966) female.  Chief Complaint:  Chief Complaint  Patient presents with  . Depression  . Anxiety    HPI [name] presents to the office today for follow-up of anxiety and depression. She reports, "I'm better. Not as depressed... more laid back." She reports that her motivation has improved some but is lower than she would like. She reports that on her days off she wants to sleep and relax instead of working on projects. Reports that depression is now controlled. She reports that she feels as if things are at a "stand still... not moving" both in her own life and the world in general. She reports that she feels like she is waiting to see "what's next" since several things are pending. Some rumination. Notices some irritability and less patience than she would like. Denies any recent physical s/s with anxiety. Reports that she has not needed to take Valium prn as often. Continued HA's. Has some anxiety with uncertainty about her financial and personal life. Reports anxiety in response to managing multiple responsibilities- "how am I going to get everything done in a short period of time?" with little to no support. She reports that she has been eating more than she thinks she should. Energy is ok once she gets started. Sleeping well and averages about 7-8 hours a night. She reports that her concentration has improved. Concentration remains lower than she would like. She reports diminished interest in things. Anhedonia. Denies SI.  She reports that she has been trying to lean in more to her faith. Looking for another job.   Past Psychiatric Medication Trials: Effexor XR- Has taken over 10 years. She reports that it has been helpful for mood and anxiety. Denies side effects. Has not been on doses about 225 mg.  Lexapro- Has taken for about a year. Has helped some for  depression Prozac Wellbutrin-Ineffective Lorazepam- Helpful Valium May have taken Zoloft   PHQ2-9     Office Visit from 12/05/2019 in [place] at [facility] from 10/26/2019 in [place] at [facility] from 02/01/2019 in [place] at [facility] from 08/24/2018 in [place] at [facility] from 03/11/2017 in [place] at [facility] Total Score  4  2  2  1   0  PHQ-9 Total Score  9  2  13  1   --       Review of Systems:  Review of Systems  Musculoskeletal: Negative for gait problem.       Muscle tension which she attributes to stress  Allergic/Immunologic: Positive for environmental allergies.       Weekly allergy shots  Neurological: Positive for headaches.  Psychiatric/Behavioral:       Please refer to HPI    Medications: I have reviewed the patient's current medications.  Current Outpatient Medications  Medication Sig Dispense Refill  . Bepotastine Besilate (BEPREVE) 1.5 % SOLN Can use one drop in each eye twice a day for itchy eyes. 10 mL 5  . Cyanocobalamin (VITAMIN B 12 PO) Take by mouth.    . ferrous sulfate (IRON SUPPLEMENT) 325 (65 FE) MG tablet Take 325 mg by mouth daily with breakfast.    . fluticasone (FLONASE) 50 MCG/ACT nasal spray Place 2 sprays into both nostrils daily. 18.2 g 2  . ibuprofen (ADVIL) 200 MG tablet Take 800 mg by mouth every 8 (eight)  hours as needed.    Marland Kitchen levocetirizine (XYZAL) 5 MG tablet Take 1 tablet (5 mg total) by mouth every evening. 90 tablet 1  . LORazepam (ATIVAN) 0.5 MG tablet TAKE ONE TABLET BY MOUTH TWICE A DAY AS NEEDED FOR ANXIETY 60 tablet 0  . Multiple Vitamin (MULTI-VITAMINS) TABS Take by mouth.    . Olopatadine HCl (PAZEO) 0.7 % SOLN Place 1 drop into both eyes 1 day or 1 dose. 2.5 mL 2  . ondansetron (ZOFRAN) 4 MG tablet Take 1 tablet (4 mg total) by mouth every 8 (eight) hours as needed for nausea or vomiting. 20 tablet 0  .  Probiotic Product (PROBIOTIC-10 PO) Take 1 tablet by mouth daily.    . Pyridoxine HCl (VITAMIN B-6 PO) Take by mouth daily.    . rizatriptan (MAXALT) 10 MG tablet TAKE ONE TABLET BY MOUTH AT ONSET OF HEADACHE; MAY REPEAT ONE TABLET IN 2 HOURS IF NEEDED. 9 tablet 2  . TURMERIC PO Take 2 tablets by mouth daily.    Marland Kitchen venlafaxine XR (EFFEXOR-XR) 150 MG 24 hr capsule Take 2 capsules (300 mg total) by mouth daily. 180 capsule 0  . azelastine (ASTELIN) 0.1 % nasal spray Can use two sprays in each nostril twice daily as needed for itchy nose and sneezing. (Patient not taking: Reported on 01/16/2020) 30 mL 2  . busPIRone (BUSPAR) 15 MG tablet Take 1/3 tablet p.o. twice daily for 1 week, then take 2/3 tablet p.o. twice daily for 1 week, then take 1 tablet p.o. twice daily 60 tablet 1   No current facility-administered medications for this visit.    Medication Side Effects: None  Allergies:  Allergies  Allergen Reactions  . Percocet [Oxycodone-Acetaminophen] Other (See Comments)    Interfered with patient's antidepressants; made pt feel dizzy  . Septra [Sulfamethoxazole-Trimethoprim] Rash    Past Medical History:  Diagnosis Date  . ALLERGIC RHINITIS 03/04/2008  . Anxiety   . DEPRESSION 03/04/2008  . Edema    pedal  . EXOGENOUS OBESITY 06/22/2010  . 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 Prolactinoma ([place])     Family History  Problem Relation Age of Onset  . Arthritis Mother        osteo  . Allergic rhinitis Mother   . Depression Mother   . Down syndrome Daughter     Social History   Socioeconomic History  . Marital status: Married    Spouse name: Not on file  . Number of children: Not on file  . Years of education: Not on file  . Highest education level: Not on file  Occupational History  . Not on file  Tobacco Use  . Smoking status: Never Smoker  . Smokeless tobacco: Never Used  Substance and Sexual Activity  . Alcohol use: No    Alcohol/week: 0.0 standard drinks  . Drug use:  No  . Sexual activity: Not Currently  Other Topics Concern  . Not on file  Social History Narrative  . Not on file   Social Determinants of Health   Financial Resource Strain:   . Difficulty of Paying Living Expenses:   Food Insecurity:   . Worried About Charity fundraiser in the Last Year:   . Arboriculturist in the Last Year:   Transportation Needs:   . Film/video editor (Medical):   Marland Kitchen Lack of Transportation (Non-Medical):   Physical Activity:   . Days of Exercise per Week:   . Minutes of Exercise per Session:  Stress:   . Feeling of Stress :   Social Connections:   . Frequency of Communication with Friends and Family:   . Frequency of Social Gatherings with Friends and Family:   . Attends Religious Services:   . Active Member of Clubs or Organizations:   . Attends Archivist Meetings:   Marland Kitchen Marital Status:   Intimate Partner Violence:   . Fear of Current or Ex-Partner:   . Emotionally Abused:   Marland Kitchen Physically Abused:   . Sexually Abused:     Past Medical History, Surgical history, Social history, and Family history were reviewed and updated as appropriate.   Please see review of systems for further details on the patient's review from today.   Objective:   Physical Exam:  There were no vitals taken for this visit.  Physical Exam Constitutional:      General: She is not in acute distress. Musculoskeletal:        General: No deformity.  Neurological:     Mental Status: She is alert and oriented to person, place, and time.     Coordination: Coordination normal.  Psychiatric:        Attention and Perception: Attention and perception normal. She does not perceive auditory or visual hallucinations.        Mood and Affect: Mood is anxious and depressed. Affect is not labile, blunt, angry or inappropriate.        Speech: Speech normal.        Behavior: Behavior normal.        Thought Content: Thought content normal. Thought content is not paranoid or  delusional. Thought content does not include homicidal or suicidal ideation. Thought content does not include homicidal or suicidal plan.        Cognition and Memory: Cognition and memory normal.        Judgment: Judgment normal.     Comments: Insight intact     Lab Review:     Component Value Date/Time   NA 135 09/14/2018 1449   K 4.2 09/14/2018 1449   CL 102 09/14/2018 1449   CO2 26 09/14/2018 1449   GLUCOSE 90 09/14/2018 1449   BUN 7 09/14/2018 1449   CREATININE 0.60 09/14/2018 1449   CALCIUM 8.6 09/14/2018 1449   PROT 6.4 09/14/2018 1449   ALBUMIN 3.6 09/14/2018 1449   AST 19 09/14/2018 1449   ALT 39 (H) 09/14/2018 1449   ALKPHOS 76 09/14/2018 1449   BILITOT 0.4 09/14/2018 1449   GFRNONAA >60 02/01/2015 1440   GFRAA >60 02/01/2015 1440       Component Value Date/Time   WBC 5.5 09/14/2018 1449   RBC 4.53 09/14/2018 1449   HGB 13.8 09/14/2018 1449   HCT 40.4 09/14/2018 1449   PLT 325.0 09/14/2018 1449   MCV 89.3 09/14/2018 1449   MCH 29.1 02/01/2015 1440   MCHC 34.0 09/14/2018 1449   RDW 13.2 09/14/2018 1449   LYMPHSABS 1.0 09/14/2018 1449   MONOABS 0.9 09/14/2018 1449   EOSABS 0.1 09/14/2018 1449   BASOSABS 0.0 09/14/2018 1449    No results found for: POCLITH, LITHIUM   No results found for: PHENYTOIN, PHENOBARB, VALPROATE, CBMZ   .res Assessment: Plan:   Pt seen for 30 minutes and time spent counseling pt regarding potential benefits, risks, and side effects of tx options to include Buspar and Abilify. Discussed potential metabolic side effects associated with atypical antipsychotics, as well as potential risk for movement side effects. Advised pt to contact office  if movement side effects occur. Pt reports that she would prefer to start Buspar due to concerns about possible wt gain with Abilify.  Start BuSpar 15 mg 1/3 tablet twice daily for 1 week, then increase to 2/3 tablet twice daily for 1 week, then increase to 1 tablet twice daily for anxiety. Continue  Effexor XR 300 mg po qd for depression and anxiety.  Continue Ativan BID prn anxiety.  Pt to f/u in 6 weeks or sooner if clinically indicated.  Patient advised to contact office with any questions, adverse effects, or acute worsening in signs and symptoms.  Dioselin was seen today for depression and anxiety.  Diagnoses and all orders for this visit:  Anxiety -     busPIRone (BUSPAR) 15 MG tablet; Take 1/3 tablet p.o. twice daily for 1 week, then take 2/3 tablet p.o. twice daily for 1 week, then take 1 tablet p.o. twice daily  Depression, recurrent ([place])     Please see After Visit Summary for patient specific instructions.  Future Appointments  Date Time Provider [place]  03/05/2020 11:00 AM Shanon Ace, LCSW [facility] None  03/26/2020 11:30 AM Thayer Headings, PMHNP [facility] None    No orders of the defined types were placed in this encounter.   -------------------------------

## 2020-02-14 ENCOUNTER — Telehealth: Payer: Self-pay | Admitting: Psychiatry

## 2020-02-14 NOTE — Telephone Encounter (Signed)
Left patient another message to call back tomorrow after 9 am to discuss

## 2020-02-14 NOTE — Telephone Encounter (Signed)
Pt called and stated she started BUSPAR 15 MG yesterday and reported that it kept her up all night. Pt is requesting a call back from nurse.  Callback # [phone]

## 2020-02-14 NOTE — Telephone Encounter (Signed)
Noted  

## 2020-02-15 NOTE — Telephone Encounter (Signed)
Return call to patient and she did change times and it's working much better. Advised to call back if symptoms worsen.

## 2020-02-21 ENCOUNTER — Ambulatory Visit (INDEPENDENT_AMBULATORY_CARE_PROVIDER_SITE_OTHER): Payer: Self-pay

## 2020-02-21 DIAGNOSIS — J309 Allergic rhinitis, unspecified: Secondary | ICD-10-CM

## 2020-03-03 NOTE — Progress Notes (Signed)
EXP 03/04/21 

## 2020-03-04 ENCOUNTER — Other Ambulatory Visit: Payer: Self-pay | Admitting: Internal Medicine

## 2020-03-04 DIAGNOSIS — Z8669 Personal history of other diseases of the nervous system and sense organs: Secondary | ICD-10-CM

## 2020-03-05 ENCOUNTER — Other Ambulatory Visit: Payer: Self-pay

## 2020-03-05 ENCOUNTER — Ambulatory Visit (INDEPENDENT_AMBULATORY_CARE_PROVIDER_SITE_OTHER): Payer: PRIVATE HEALTH INSURANCE | Admitting: Psychiatry

## 2020-03-05 DIAGNOSIS — J3089 Other allergic rhinitis: Secondary | ICD-10-CM | POA: Diagnosis not present

## 2020-03-05 DIAGNOSIS — F419 Anxiety disorder, unspecified: Secondary | ICD-10-CM | POA: Diagnosis not present
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
## 2020-03-26 ENCOUNTER — Encounter: Payer: Self-pay | Admitting: Psychiatry

## 2020-03-26 ENCOUNTER — Ambulatory Visit (INDEPENDENT_AMBULATORY_CARE_PROVIDER_SITE_OTHER): Payer: PRIVATE HEALTH INSURANCE | Admitting: Psychiatry

## 2020-03-26 ENCOUNTER — Other Ambulatory Visit: Payer: Self-pay

## 2020-03-26 ENCOUNTER — Other Ambulatory Visit: Payer: Self-pay | Admitting: Internal Medicine

## 2020-03-26 DIAGNOSIS — F339 Major depressive disorder, recurrent, unspecified: Secondary | ICD-10-CM

## 2020-03-26 DIAGNOSIS — F419 Anxiety disorder, unspecified: Secondary | ICD-10-CM | POA: Diagnosis not present

## 2020-03-26 MED ORDER — LORAZEPAM 0.5 MG PO TABS: ORAL_TABLET | ORAL | 2 refills | Status: DC

## 2020-03-26 MED ORDER — BUSPIRONE HCL 15 MG PO TABS: 15.0000 mg | ORAL_TABLET | Freq: Two times a day (BID) | ORAL | 0 refills | Status: DC

## 2020-03-26 MED ORDER — VENLAFAXINE HCL ER 150 MG PO CP24: 300.0000 mg | ORAL_CAPSULE | Freq: Every day | ORAL | 0 refills | Status: DC

## 2020-03-26 NOTE — Telephone Encounter (Signed)
150 mg last filled by  Thayer Headings, PMHNP 75 mg not on current medication list.
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
## 2020-04-03 ENCOUNTER — Ambulatory Visit (INDEPENDENT_AMBULATORY_CARE_PROVIDER_SITE_OTHER): Payer: PRIVATE HEALTH INSURANCE

## 2020-04-03 DIAGNOSIS — J309 Allergic rhinitis, unspecified: Secondary | ICD-10-CM

## 2020-04-09 ENCOUNTER — Ambulatory Visit: Payer: Self-pay | Admitting: Psychiatry

## 2020-04-14 ENCOUNTER — Other Ambulatory Visit: Payer: Self-pay | Admitting: Internal Medicine

## 2020-04-14 DIAGNOSIS — Z8669 Personal history of other diseases of the nervous system and sense organs: Secondary | ICD-10-CM

## 2020-04-15 ENCOUNTER — Ambulatory Visit: Payer: PRIVATE HEALTH INSURANCE | Admitting: Allergy & Immunology

## 2020-04-15 ENCOUNTER — Encounter: Payer: Self-pay | Admitting: Allergy & Immunology

## 2020-04-15 ENCOUNTER — Other Ambulatory Visit: Payer: Self-pay

## 2020-04-15 VITALS — BP 162/96 | HR 83 | Temp 98.0°F | Resp 18 | Ht 62.0 in | Wt 189.8 lb

## 2020-04-15 DIAGNOSIS — J302 Other seasonal allergic rhinitis: Secondary | ICD-10-CM | POA: Diagnosis not present

## 2020-04-15 DIAGNOSIS — J3089 Other allergic rhinitis: Secondary | ICD-10-CM

## 2020-04-15 DIAGNOSIS — J0100 Acute maxillary sinusitis, unspecified: Secondary | ICD-10-CM | POA: Diagnosis not present

## 2020-04-15 DIAGNOSIS — Z7189 Other specified counseling: Secondary | ICD-10-CM | POA: Diagnosis not present

## 2020-04-15 DIAGNOSIS — Z7185 Encounter for immunization safety counseling: Secondary | ICD-10-CM

## 2020-04-15 MED ORDER — DOXYCYCLINE HYCLATE 100 MG PO TBEC: 100.0000 mg | DELAYED_RELEASE_TABLET | Freq: Two times a day (BID) | ORAL | 0 refills | Status: AC

## 2020-04-15 MED ORDER — GUAIFENESIN-CODEINE 100-10 MG/5ML PO SOLN: 10.0000 mL | Freq: Three times a day (TID) | ORAL | 0 refills | Status: AC | PRN

## 2020-04-15 NOTE — Patient Instructions (Addendum)
1. Allergic rhinitis - on allergen immunotherapy - Continue cetirizine 10 mg once a day as needed for a runny nose - Continue Flonase 1-2 sprays in each nostril once a day as needed for a stuffy nose.  - Continue with Pazeo one drop per eye daily. - Continue with allergy shots at the same schedule.    2. Acute sinusitis  - With your current symptoms and time course, antibiotics are needed: doxycycline 100mg  twice daily for 10 days - Add on nasal saline spray (i.e., Simply Saline) or nasal saline lavage (i.e., NeilMed) as needed prior to medicated nasal sprays. - Start Mucinex-codeine 42mL every 8 hours as needed.  3. Return in about 1 year (around 04/15/2021). This can be an in-person, a virtual Webex or a telephone follow up visit.   Please inform us of any Emergency Department visits, hospitalizations, or changes in symptoms. Call us before going to the ED for breathing or allergy symptoms since we might be able to fit you in for a sick visit. Feel free to contact us anytime with any questions, problems, or concerns.  It was a pleasure to meet you today!  Websites that have reliable patient information: 1. American Academy of Asthma, Allergy, and Immunology: www.aaaai.org 2. Food Allergy Research and Education (FARE): foodallergy.org 3. Mothers of Asthmatics: http://www.asthmacommunitynetwork.org 4. American College of Allergy, Asthma, and Immunology: www.acaai.org   STRONGLY CONSIDER GETTING A COVID-19 VACCINATION!   COVID-19 Vaccine Information can be found at: ShippingScam.co.uk For questions related to vaccine distribution or appointments, please email vaccine@ .com or call [phone].     "Like" [place] on Facebook and Instagram for our latest updates!        Make sure you are registered to vote! If you have moved or changed any of your contact information, you will need to get this updated before voting!  In  some cases, you MAY be able to register to vote online: [email or web address]

## 2020-04-15 NOTE — Progress Notes (Signed)
FOLLOW UP  Date of Service/Encounter:  04/15/20   Assessment:   Perennial and seasonal allergic rhinitis (trees, dust mites) - on allergen immunotherapy (started March 2017 and reached maintenance March 2018)  Acute sinusitis  COVID-19 vaccine hesitancy  Plan/Recommendations:   1. Allergic rhinitis - on allergen immunotherapy - Continue cetirizine 10 mg once a day as needed for a runny nose - Continue Flonase 1-2 sprays in each nostril once a day as needed for a stuffy nose.  - Continue with Pazeo one drop per eye daily. - Continue with allergy shots at the same schedule.    2. Acute sinusitis  - With your current symptoms and time course, antibiotics are needed: doxycycline 100mg  twice daily for 10 days - Add on nasal saline spray (i.e., Simply Saline) or nasal saline lavage (i.e., NeilMed) as needed prior to medicated nasal sprays. - Start Mucinex-codeine 80mL every 8 hours as needed.  3. Return in about 1 year (around 04/15/2021). This can be an in-person, a virtual Webex or a telephone follow up visit.  Subjective:   Chelsea Dyer is a 54 y.o. female presenting today for follow up of  Chief Complaint  Patient presents with  . Sinus Problem    x 1 week. Tried Elderberry and Nyquil.    Chelsea Dyer has a history of the following: Patient Active Problem List   Diagnosis Date Noted  . Chronic hip pain, bilateral 10/08/2019  . Seasonal and perennial allergic rhinitis 07/26/2019  . Seasonal allergic conjunctivitis 07/26/2019  . Nausea and vomiting 09/07/2018  . Flu-like symptoms 09/07/2018  . Diarrhea 09/07/2018  . Menopausal syndrome (hot flashes) 06/01/2018  . Hyperprolactinemia (Chelsea Dyer) 03/11/2017  . History of migraine headaches 03/11/2017  . Epistaxis 10/02/2013  . EXOGENOUS OBESITY 06/22/2010  . 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 Depression, recurrent (Chelsea Dyer) 03/04/2008  . Allergic rhinitis 03/04/2008    History obtained from: chart review and  patient.  Chelsea Dyer is a 54 y.o. female presenting for a sick visit.  She was last seen in November 2020.  At that time, she was continued on cetirizine 10 mg daily as well as Flonase and Astelin.  She was using all of these on an as-needed basis.  She was continued on her allergen immunotherapy.  She was also started on Pazeo 1 drop per eye daily as needed for itchy red eyes.  Since last visit, she has mostly done well. She did receive her allergy shot one week ago and developed hoarseness with sinus pain and pressure. She reports greens discharge with coughing. She has had no fevers. She denies sick contacts at all.  She currently works from home. 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   [name] is on allergen immunotherapy. She receives two injections. Immunotherapy script #1 contains trees. She currently receives 0.41mL of the RED vial (1/100). Immunotherapy script #2 contains dust mites. She currently receives 0.47mL of the RED vial (1/100). She started shots March of 2017 and reached maintenance in March of 2018.  She has had her allergy shots have been helping quite a bit.  She has not been ill as often as she used to be.  She has not been in the hospital.  Asthma is under good control.  She has not received her COVID-19 vaccine.  She does have some hesitancy is about it but does not want again to for details.  She denies any Covid exposures.  Otherwise, there have  been no changes to her past medical history, surgical history, family history, or social history.    Review of Systems  Constitutional: Negative.  Negative for chills, fever, malaise/fatigue and weight loss.  HENT: Positive for congestion and sinus pain. Negative for ear discharge and ear pain.        Positive for hoarseness.  Eyes: Negative for pain, discharge and redness.  Respiratory: Negative for cough, sputum production, shortness of breath and wheezing.    Cardiovascular: Negative.  Negative for chest pain and palpitations.  Gastrointestinal: Negative for abdominal pain, constipation, diarrhea, heartburn, nausea and vomiting.  Skin: Negative.  Negative for itching and rash.  Neurological: Negative for dizziness and headaches.  Endo/Heme/Allergies: Positive for environmental allergies. Does not bruise/bleed easily.       Objective:   Blood pressure (!) 162/96, pulse 83, temperature 98 F (36.7 C), temperature source Temporal, resp. rate 18, height 5\' 2"  (1.575 m), weight 189 lb 12.8 oz (86.1 kg), SpO2 96 %. Body mass index is 34.71 kg/m.   Physical Exam:  Physical Exam Constitutional:      Appearance: She is well-developed.     Comments: Difficult to understand.  HENT:     Head: Normocephalic and atraumatic.     Right Ear: Tympanic membrane, ear canal and external ear normal.     Left Ear: Tympanic membrane, ear canal and external ear normal.     Nose: No nasal deformity, septal deviation, mucosal edema or rhinorrhea.     Right Turbinates: Enlarged, swollen and pale.     Left Turbinates: Enlarged, swollen and pale.     Right Sinus: No maxillary sinus tenderness or frontal sinus tenderness.     Left Sinus: No maxillary sinus tenderness or frontal sinus tenderness.     Mouth/Throat:     Mouth: Mucous membranes are not pale and not dry.     Pharynx: Uvula midline.  Eyes:     General:        Right eye: No discharge.        Left eye: No discharge.     Conjunctiva/sclera: Conjunctivae normal.     Right eye: Right conjunctiva is not injected. No chemosis.    Left eye: Left conjunctiva is not injected. No chemosis.    Pupils: Pupils are equal, round, and reactive to light.  Cardiovascular:     Rate and Rhythm: Normal rate and regular rhythm.     Heart sounds: Normal heart sounds.  Pulmonary:     Effort: Pulmonary effort is normal. No tachypnea, accessory muscle usage or respiratory distress.     Breath sounds: Normal breath  sounds. No wheezing, rhonchi or rales.     Comments: Moving air well in all lung fields.  No increased work of breathing. Chest:     Chest wall: No tenderness.  Lymphadenopathy:     Cervical: No cervical adenopathy.  Skin:    Coloration: Skin is not pale.     Findings: No abrasion, erythema, petechiae or rash. Rash is not papular, urticarial or vesicular.     Comments: No eczematous or urticarial lesions noted.  Neurological:     Mental Status: She is alert.  Psychiatric:        Behavior: Behavior is cooperative.      Diagnostic studies: none     Salvatore Marvel, MD  [facility]

## 2020-04-23 ENCOUNTER — Ambulatory Visit: Payer: Self-pay | Admitting: Psychiatry

## 2020-05-07 ENCOUNTER — Ambulatory Visit: Payer: Self-pay | Admitting: Psychiatry

## 2020-05-11 ENCOUNTER — Other Ambulatory Visit: Payer: Self-pay | Admitting: Internal Medicine

## 2020-05-11 DIAGNOSIS — Z8669 Personal history of other diseases of the nervous system and sense organs: Secondary | ICD-10-CM

## 2020-05-15 ENCOUNTER — Other Ambulatory Visit: Payer: Self-pay | Admitting: Internal Medicine

## 2020-05-15 DIAGNOSIS — F339 Major depressive disorder, recurrent, unspecified: Secondary | ICD-10-CM

## 2020-05-15 NOTE — Telephone Encounter (Signed)
Patient goes to [facility].  Okay to refill?

## 2020-05-22 ENCOUNTER — Ambulatory Visit (INDEPENDENT_AMBULATORY_CARE_PROVIDER_SITE_OTHER): Payer: PRIVATE HEALTH INSURANCE

## 2020-05-22 DIAGNOSIS — J309 Allergic rhinitis, unspecified: Secondary | ICD-10-CM

## 2020-05-29 ENCOUNTER — Ambulatory Visit (INDEPENDENT_AMBULATORY_CARE_PROVIDER_SITE_OTHER): Payer: PRIVATE HEALTH INSURANCE | Admitting: *Deleted

## 2020-05-29 DIAGNOSIS — J309 Allergic rhinitis, unspecified: Secondary | ICD-10-CM | POA: Diagnosis not present

## 2020-06-04 ENCOUNTER — Ambulatory Visit (INDEPENDENT_AMBULATORY_CARE_PROVIDER_SITE_OTHER): Payer: PRIVATE HEALTH INSURANCE

## 2020-06-04 DIAGNOSIS — J309 Allergic rhinitis, unspecified: Secondary | ICD-10-CM

## 2020-06-06 ENCOUNTER — Ambulatory Visit: Payer: PRIVATE HEALTH INSURANCE | Admitting: Internal Medicine

## 2020-06-06 ENCOUNTER — Other Ambulatory Visit: Payer: Self-pay

## 2020-06-06 ENCOUNTER — Encounter: Payer: Self-pay | Admitting: Internal Medicine

## 2020-06-06 VITALS — BP 110/80 | HR 79 | Temp 97.8°F | Wt 186.6 lb

## 2020-06-06 DIAGNOSIS — N951 Menopausal and female climacteric states: Secondary | ICD-10-CM

## 2020-06-06 NOTE — Progress Notes (Signed)
Established Patient Office Visit     This visit occurred during the SARS-CoV-2 public health emergency.  Safety protocols were in place, including screening questions prior to the visit, additional usage of staff PPE, and extensive cleaning of exam room while observing appropriate contact time as indicated for disinfecting solutions.    CC/Reason for Visit: Discuss menopausal symptoms  HPI: Chelsea Dyer is a 54 y.o. female who is coming in today for the above mentioned reasons.  She has not had a period now in 2 years.  She is having significant hot flashes that she feels are impacting her day-to-day.  Her sister recommended that she go on Estrace as she has had significant improvement in symptoms after being on this.  She feels her mood is improved.  Her psychiatrist has been making some changes to her medications.  She is yet hesitant to receive the Covid vaccine or the flu vaccine.   Past Medical/Surgical History: Past Medical History:  Diagnosis Date  . ALLERGIC RHINITIS 03/04/2008  . Anxiety   . DEPRESSION 03/04/2008  . Edema    pedal  . EXOGENOUS OBESITY 06/22/2010  . 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 Prolactinoma [facility])     Past Surgical History:  Procedure Laterality Date  . BREAST SURGERY  2004   augmentation  . HAMMER TOE SURGERY    . LIPOSUCTION    . NASAL SINUS SURGERY      Social History:  reports that she has never smoked. She has never used smokeless tobacco. She reports that she does not drink alcohol and does not use drugs.  Allergies: Allergies  Allergen Reactions  . Percocet [Oxycodone-Acetaminophen] Other (See Comments)    Interfered with patient's antidepressants; made pt feel dizzy  . Septra [Sulfamethoxazole-Trimethoprim] Rash    Family History:  Family History  Problem Relation Age of Onset  . Arthritis Mother        osteo  . Allergic rhinitis Mother   . Depression Mother   . Down syndrome Daughter      Current Outpatient  Medications:  .  azelastine (ASTELIN) 0.1 % nasal spray, Can use two sprays in each nostril twice daily as needed for itchy nose and sneezing., Disp: 30 mL, Rfl: 2 .  Bepotastine Besilate (BEPREVE) 1.5 % SOLN, Can use one drop in each eye twice a day for itchy eyes., Disp: 10 mL, Rfl: 5 .  busPIRone (BUSPAR) 15 MG tablet, Take 1 tablet (15 mg total) by mouth 2 (two) times daily., Disp: 180 tablet, Rfl: 0 .  Cyanocobalamin (VITAMIN B 12 PO), Take by mouth., Disp: , Rfl:  .  diclofenac (CATAFLAM) 50 MG tablet, Take by mouth., Disp: , Rfl:  .  escitalopram (LEXAPRO) 10 MG tablet, TAKE ONE TABLET BY MOUTH DAILY, Disp: 90 tablet, Rfl: 1 .  ferrous sulfate (IRON SUPPLEMENT) 325 (65 FE) MG tablet, Take 325 mg by mouth daily with breakfast., Disp: , Rfl:  .  fluticasone (FLONASE) 50 MCG/ACT nasal spray, Place 2 sprays into both nostrils daily., Disp: 18.2 g, Rfl: 2 .  ibuprofen (ADVIL) 200 MG tablet, Take 800 mg by mouth every 8 (eight) hours as needed., Disp: , Rfl:  .  levocetirizine (XYZAL) 5 MG tablet, Take 1 tablet (5 mg total) by mouth every evening., Disp: 90 tablet, Rfl: 1 .  LORazepam (ATIVAN) 0.5 MG tablet, TAKE ONE TABLET BY MOUTH TWICE A DAY AS NEEDED FOR ANXIETY, Disp: 60 tablet, Rfl: 2 .  Multiple Vitamin (MULTI-VITAMINS) TABS, Take by  mouth., Disp: , Rfl:  .  Olopatadine HCl (PAZEO) 0.7 % SOLN, Place 1 drop into both eyes 1 day or 1 dose., Disp: 2.5 mL, Rfl: 2 .  ondansetron (ZOFRAN) 4 MG tablet, Take 1 tablet (4 mg total) by mouth every 8 (eight) hours as needed for nausea or vomiting., Disp: 20 tablet, Rfl: 0 .  Probiotic Product (PROBIOTIC-10 PO), Take 1 tablet by mouth daily., Disp: , Rfl:  .  Pyridoxine HCl (VITAMIN B-6 PO), Take by mouth daily., Disp: , Rfl:  .  rizatriptan (MAXALT) 10 MG tablet, TAKE ONE TABLET BY MOUTH AT ONSET OF HEADACHE; MAY REPEAT ONE TABLET IN 2 HOURS IF NEEDED., Disp: 9 tablet, Rfl: 0 .  SUMAtriptan (IMITREX) 100 MG tablet, Take by mouth., Disp: , Rfl:  .   TURMERIC PO, Take 2 tablets by mouth daily., Disp: , Rfl:  .  venlafaxine XR (EFFEXOR-XR) 150 MG 24 hr capsule, Take 2 capsules (300 mg total) by mouth daily., Disp: 180 capsule, Rfl: 0  Review of Systems:  Constitutional: Denies fever, chills, diaphoresis, appetite change and fatigue.  HEENT: Denies photophobia, eye pain, redness, hearing loss, ear pain, congestion, sore throat, rhinorrhea, sneezing, mouth sores, trouble swallowing, neck pain, neck stiffness and tinnitus.   Respiratory: Denies SOB, DOE, cough, chest tightness,  and wheezing.   Cardiovascular: Denies chest pain, palpitations and leg swelling.  Gastrointestinal: Denies nausea, vomiting, abdominal pain, diarrhea, constipation, blood in stool and abdominal distention.  Genitourinary: Denies dysuria, urgency, frequency, hematuria, flank pain and difficulty urinating.  Endocrine: Denies: hot or cold intolerance, sweats, changes in hair or nails, polyuria, polydipsia. Musculoskeletal: Denies myalgias, back pain, joint swelling, arthralgias and gait problem.  Skin: Denies pallor, rash and wound.  Neurological: Denies dizziness, seizures, syncope, weakness, light-headedness, numbness and headaches.  Hematological: Denies adenopathy. Easy bruising, personal or family bleeding history  Psychiatric/Behavioral: Denies suicidal ideation, mood changes, confusion, nervousness, sleep disturbance and agitation    Physical Exam: Vitals:   06/06/20 0659  BP: 110/80  Pulse: 79  Temp: 97.8 F (36.6 C)  TempSrc: Oral  SpO2: 95%  Weight: 186 lb 9.6 oz (84.6 kg)    Body mass index is 34.13 kg/m.   Constitutional: NAD, calm, comfortable Eyes: PERRL, lids and conjunctivae normal, wears corrective lenses ENMT: Mucous membranes are moist.  Neurologic: Grossly intact and nonfocal Psychiatric: Normal judgment and insight. Alert and oriented x 3. Normal mood.    Impression and Plan:  Menopausal syndrome (hot flashes) -We have discussed  risks and benefits of being on hormone replacement therapy.  Because she has an intact uterus she would have to take progesterone as well.  She does not seem inclined on taking 2 medications every day.  She also seems a little put off about the risk of thrombosis and thromboembolic disease. -I have given her the website to the [facility] for her to do some more research and she can reach out to me if she does decide to proceed with hormone replacement therapy in the future.  Time Spent: 30 minutes    Syann Cupples Isaac Bliss, MD [facility] [place]

## 2020-06-09 ENCOUNTER — Ambulatory Visit: Payer: PRIVATE HEALTH INSURANCE | Admitting: Psychiatry

## 2020-06-10 ENCOUNTER — Other Ambulatory Visit: Payer: Self-pay | Admitting: Internal Medicine

## 2020-06-10 DIAGNOSIS — Z8669 Personal history of other diseases of the nervous system and sense organs: Secondary | ICD-10-CM

## 2020-06-12 ENCOUNTER — Ambulatory Visit (INDEPENDENT_AMBULATORY_CARE_PROVIDER_SITE_OTHER): Payer: PRIVATE HEALTH INSURANCE | Admitting: *Deleted

## 2020-06-12 DIAGNOSIS — J309 Allergic rhinitis, unspecified: Secondary | ICD-10-CM

## 2020-06-16 ENCOUNTER — Other Ambulatory Visit: Payer: Self-pay | Admitting: Psychiatry

## 2020-06-16 ENCOUNTER — Other Ambulatory Visit: Payer: Self-pay | Admitting: Internal Medicine

## 2020-06-16 DIAGNOSIS — F419 Anxiety disorder, unspecified: Secondary | ICD-10-CM

## 2020-06-16 DIAGNOSIS — Z8669 Personal history of other diseases of the nervous system and sense organs: Secondary | ICD-10-CM

## 2020-06-16 NOTE — Telephone Encounter (Signed)
Review.

## 2020-06-17 ENCOUNTER — Ambulatory Visit (INDEPENDENT_AMBULATORY_CARE_PROVIDER_SITE_OTHER): Payer: PRIVATE HEALTH INSURANCE | Admitting: *Deleted

## 2020-06-17 DIAGNOSIS — J309 Allergic rhinitis, unspecified: Secondary | ICD-10-CM

## 2020-06-23 ENCOUNTER — Ambulatory Visit (INDEPENDENT_AMBULATORY_CARE_PROVIDER_SITE_OTHER): Payer: PRIVATE HEALTH INSURANCE

## 2020-06-23 ENCOUNTER — Ambulatory Visit: Payer: PRIVATE HEALTH INSURANCE | Admitting: Psychiatry

## 2020-06-23 DIAGNOSIS — J309 Allergic rhinitis, unspecified: Secondary | ICD-10-CM

## 2020-06-26 ENCOUNTER — Ambulatory Visit: Payer: PRIVATE HEALTH INSURANCE | Admitting: Psychiatry

## 2020-07-01 ENCOUNTER — Ambulatory Visit: Payer: PRIVATE HEALTH INSURANCE | Admitting: Psychiatry

## 2020-07-16 DIAGNOSIS — M25511 Pain in right shoulder: Secondary | ICD-10-CM | POA: Insufficient documentation

## 2020-07-23 ENCOUNTER — Ambulatory Visit (INDEPENDENT_AMBULATORY_CARE_PROVIDER_SITE_OTHER): Payer: PRIVATE HEALTH INSURANCE | Admitting: *Deleted

## 2020-07-23 ENCOUNTER — Other Ambulatory Visit: Payer: Self-pay | Admitting: Psychiatry

## 2020-07-23 DIAGNOSIS — F339 Major depressive disorder, recurrent, unspecified: Secondary | ICD-10-CM

## 2020-07-23 DIAGNOSIS — J309 Allergic rhinitis, unspecified: Secondary | ICD-10-CM

## 2020-07-25 ENCOUNTER — Other Ambulatory Visit: Payer: Self-pay | Admitting: Internal Medicine

## 2020-07-25 ENCOUNTER — Other Ambulatory Visit: Payer: Self-pay | Admitting: Psychiatry

## 2020-07-25 DIAGNOSIS — F419 Anxiety disorder, unspecified: Secondary | ICD-10-CM

## 2020-07-25 DIAGNOSIS — Z8669 Personal history of other diseases of the nervous system and sense organs: Secondary | ICD-10-CM

## 2020-07-28 NOTE — Telephone Encounter (Signed)
Last apt 06/02 due back 6 weeks

## 2020-08-21 ENCOUNTER — Other Ambulatory Visit: Payer: Self-pay | Admitting: Psychiatry

## 2020-08-21 ENCOUNTER — Other Ambulatory Visit: Payer: Self-pay | Admitting: Internal Medicine

## 2020-08-21 DIAGNOSIS — Z8669 Personal history of other diseases of the nervous system and sense organs: Secondary | ICD-10-CM

## 2020-08-21 DIAGNOSIS — F419 Anxiety disorder, unspecified: Secondary | ICD-10-CM

## 2020-08-22 NOTE — Telephone Encounter (Signed)
Chelsea Dyer is scheduled for a follow-up on 11/05/19 at 09:00.

## 2020-08-22 NOTE — Telephone Encounter (Signed)
Still no apt last visit 03/2020

## 2020-08-28 ENCOUNTER — Ambulatory Visit (INDEPENDENT_AMBULATORY_CARE_PROVIDER_SITE_OTHER): Payer: PRIVATE HEALTH INSURANCE

## 2020-08-28 DIAGNOSIS — J309 Allergic rhinitis, unspecified: Secondary | ICD-10-CM

## 2020-09-15 ENCOUNTER — Encounter: Payer: Self-pay | Admitting: Physician Assistant

## 2020-09-15 ENCOUNTER — Other Ambulatory Visit: Payer: Self-pay | Admitting: Internal Medicine

## 2020-09-15 ENCOUNTER — Telehealth (INDEPENDENT_AMBULATORY_CARE_PROVIDER_SITE_OTHER): Payer: PRIVATE HEALTH INSURANCE | Admitting: Physician Assistant

## 2020-09-15 ENCOUNTER — Other Ambulatory Visit: Payer: PRIVATE HEALTH INSURANCE

## 2020-09-15 ENCOUNTER — Other Ambulatory Visit: Payer: Self-pay

## 2020-09-15 DIAGNOSIS — G43909 Migraine, unspecified, not intractable, without status migrainosus: Secondary | ICD-10-CM | POA: Diagnosis not present

## 2020-09-15 DIAGNOSIS — Z8669 Personal history of other diseases of the nervous system and sense organs: Secondary | ICD-10-CM

## 2020-09-15 DIAGNOSIS — Z20822 Contact with and (suspected) exposure to covid-19: Secondary | ICD-10-CM

## 2020-09-15 DIAGNOSIS — J0100 Acute maxillary sinusitis, unspecified: Secondary | ICD-10-CM | POA: Diagnosis not present

## 2020-09-15 MED ORDER — ZOLMITRIPTAN 5 MG PO TABS: ORAL_TABLET | ORAL | 0 refills | Status: DC

## 2020-09-15 NOTE — Patient Instructions (Signed)
Instructions sent to patients MyChart.

## 2020-09-15 NOTE — Progress Notes (Signed)
Virtual Visit via Video   I connected with patient on 09/15/20 at  4:00 PM EST by a video enabled telemedicine application and verified that I am speaking with the correct person using two identifiers.  Location patient: Home Location provider: [facility], Office Persons participating in the virtual visit: Patient, Provider, CMA (Patina Moore)  I discussed the limitations of evaluation and management by telemedicine and the availability of in person appointments. The patient expressed understanding and agreed to proceed.  Subjective:   HPI:  Patient presents via [facility] today 3 days of nasal congestion, cough, left-sided facial pressure and pain with left-sided migraine associated with photophobia, body aches, chills and low back pain.  Denies fever.  Denies chest pain.  Some very slight windedness with exertion.  No shortness of breath/wheezing at rest.  Has been trying to keep well-hydrated and taking Mucinex which is helping some with her congestion.  Is on daily Flonase and Xyzal which she continues to take.  Has been taking Maxalt for headache with helps improve the headache but does not completely abort headache.  Denies recent travel or sick contact.  Does work in Engineering geologist.  Is unvaccinated.  ROS:   See pertinent positives and negatives per HPI.  Patient Active Problem List   Diagnosis Date Noted  . Chronic hip pain, bilateral 10/08/2019  . Seasonal and perennial allergic rhinitis 07/26/2019  . Seasonal allergic conjunctivitis 07/26/2019  . Nausea and vomiting 09/07/2018  . Flu-like symptoms 09/07/2018  . Diarrhea 09/07/2018  . Menopausal syndrome (hot flashes) 06/01/2018  . Hyperprolactinemia (HCC) 03/11/2017  . History of migraine headaches 03/11/2017  . Epistaxis 10/02/2013  . EXOGENOUS OBESITY 06/22/2010  . 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 Depression, recurrent (HCC) 03/04/2008  . Allergic rhinitis 03/04/2008    Social History   Tobacco Use  .  Smoking status: Never Smoker  . Smokeless tobacco: Never Used  Substance Use Topics  . Alcohol use: No    Alcohol/week: 0.0 standard drinks    Current Outpatient Medications:  .  azelastine (ASTELIN) 0.1 % nasal spray, Can use two sprays in each nostril twice daily as needed for itchy nose and sneezing., Disp: 30 mL, Rfl: 2 .  Bepotastine Besilate (BEPREVE) 1.5 % SOLN, Can use one drop in each eye twice a day for itchy eyes., Disp: 10 mL, Rfl: 5 .  busPIRone (BUSPAR) 15 MG tablet, TAKE ONE TABLET BY MOUTH TWICE A DAY, Disp: 180 tablet, Rfl: 0 .  Cyanocobalamin (VITAMIN B 12 PO), Take by mouth., Disp: , Rfl:  .  diclofenac (CATAFLAM) 50 MG tablet, Take by mouth., Disp: , Rfl:  .  escitalopram (LEXAPRO) 10 MG tablet, TAKE ONE TABLET BY MOUTH DAILY, Disp: 90 tablet, Rfl: 1 .  ferrous sulfate 325 (65 FE) MG tablet, Take 325 mg by mouth daily with breakfast., Disp: , Rfl:  .  fluticasone (FLONASE) 50 MCG/ACT nasal spray, Place 2 sprays into both nostrils daily., Disp: 18.2 g, Rfl: 2 .  ibuprofen (ADVIL) 200 MG tablet, Take 800 mg by mouth every 8 (eight) hours as needed., Disp: , Rfl:  .  levocetirizine (XYZAL) 5 MG tablet, Take 1 tablet (5 mg total) by mouth every evening., Disp: 90 tablet, Rfl: 1 .  LORazepam (ATIVAN) 0.5 MG tablet, TAKE ONE TABLET BY MOUTH TWICE A DAY AS NEEDED FOR ANXIETY, Disp: 60 tablet, Rfl: 0 .  Multiple Vitamin (MULTI-VITAMINS) TABS, Take by mouth., Disp: , Rfl:  .  Olopatadine HCl (PAZEO) 0.7 % SOLN, Place 1  drop into both eyes 1 day or 1 dose., Disp: 2.5 mL, Rfl: 2 .  ondansetron (ZOFRAN) 4 MG tablet, Take 1 tablet (4 mg total) by mouth every 8 (eight) hours as needed for nausea or vomiting., Disp: 20 tablet, Rfl: 0 .  Probiotic Product (PROBIOTIC-10 PO), Take 1 tablet by mouth daily., Disp: , Rfl:  .  Pyridoxine HCl (VITAMIN B-6 PO), Take by mouth daily., Disp: , Rfl:  .  rizatriptan (MAXALT) 10 MG tablet, TAKE ONE TABLET BY MOUTH AT ONSET OF HEADACHE; MAY REPEAT ONE  TABLET IN 2 HOURS IF NEEDED., Disp: 9 tablet, Rfl: 0 .  SUMAtriptan (IMITREX) 100 MG tablet, Take by mouth., Disp: , Rfl:  .  TURMERIC PO, Take 2 tablets by mouth daily., Disp: , Rfl:  .  venlafaxine XR (EFFEXOR-XR) 150 MG 24 hr capsule, TAKE TWO CAPSULES BY MOUTH DAILY, Disp: 180 capsule, Rfl: 0  Allergies  Allergen Reactions  . Percocet [Oxycodone-Acetaminophen] Other (See Comments)    Interfered with patient's antidepressants; made pt feel dizzy  . Septra [Sulfamethoxazole-Trimethoprim] Rash    Objective:   LMP 08/24/2018   Patient is well-developed, well-nourished in no acute distress.  Resting comfortably at home.  Head is normocephalic, atraumatic.  No labored breathing.  Speech is clear and coherent with logical content.  Patient is alert and oriented at baseline.  Left maxillary sinus pain  Assessment and Plan:   1. Suspected COVID-19 virus infection Patient with a lot of classic symptoms concerning for Covid.  Unvaccinated status raises concern for early.  She would be considered a candidate for monoclonal antibody infusion giving risks and unvaccinated.  Want to get her tested ASAP so that she can be considered for infusion as there is a short window on this.  Patient given information on where to be Covid tested out.  She is to quarantine until results are in.  Patient enrolled in symptom monitoring program.  Start vitamin D, vitamin C and zinc as discussed.  OTC medications and supportive measures discussed.  ER precautions given to patient.  2. Migraine without status migrainosus, not intractable, unspecified migraine type Suspect triggered by and sinus inflammation which we are treating.  Maxalt subtherapeutic at present.  We will have her stop and attempt trial of Zomig instead.  If no further improvement would recommend consideration of steroid taper or being seen at one of our respiratory clinics to get an Toradol.  3. Acute non-recurrent maxillary sinusitis In the  setting of suspected Covid.  However having substantial left maxillary sinus pain which does raise concern for bacterial sinusitis. Will have her continue allergy medications including nasal steroid.  We will have her monitor sinus pressure and sinus pain over the next 48 hours.  If not improving with OTC medications, will have her take Rx for doxycycline as directed.    Leeanne Rio, PA-C 09/15/2020

## 2020-09-15 NOTE — Progress Notes (Signed)
I have discussed the procedure for the virtual visit with the patient who has given consent to proceed with assessment and treatment.   Mackenzey Crownover S Ndea Kilroy, CMA     

## 2020-09-16 ENCOUNTER — Telehealth: Payer: PRIVATE HEALTH INSURANCE | Admitting: Family Medicine

## 2020-09-16 LAB — SARS-COV-2, NAA 2 DAY TAT

## 2020-09-16 LAB — NOVEL CORONAVIRUS, NAA: SARS-CoV-2, NAA: DETECTED — AB

## 2020-09-23 ENCOUNTER — Other Ambulatory Visit: Payer: PRIVATE HEALTH INSURANCE

## 2020-09-24 ENCOUNTER — Telehealth: Payer: PRIVATE HEALTH INSURANCE | Admitting: Internal Medicine

## 2020-09-24 DIAGNOSIS — U071 COVID-19: Secondary | ICD-10-CM | POA: Diagnosis not present

## 2020-09-24 NOTE — Progress Notes (Signed)
Virtual Visit via Video Note  I connected with Chelsea Dyer on 09/24/20 at  8:15 AM EST by a video enabled telemedicine application and verified that I am speaking with the correct person using two identifiers.  Location patient: home Location provider: work office Persons participating in the virtual visit: patient, provider  I discussed the limitations of evaluation and management by telemedicine and the availability of in person appointments. The patient expressed understanding and agreed to proceed.   HPI: Chelsea Dyer was unfortunately diagnosed with COVID-19 on January 3.  Leading up to this visit she had been experiencing sinus pain, cough, congestion, body aches, headache and diarrhea with abdominal cramping.  She did not have any fever.  No shortness of breath.  She has continued to take OTC medications such as pain relievers, decongestants, Mucinex.  She has been having lightheadedness, dizziness, leg weakness and what she describes as a "mental fog".  She has not been vaccinated.  She believes that overall she is improving.   ROS: Constitutional: Denies fever, chills, diaphoresis, appetite change. HEENT: Denies photophobia, eye pain, redness, hearing loss, mouth sores, trouble swallowing, neck pain, neck stiffness and tinnitus.   Respiratory: Denies SOB, DOE, chest tightness,  and wheezing.   Cardiovascular: Denies chest pain, palpitations and leg swelling.  Gastrointestinal: Denies nausea, vomiting, abdominal pain, diarrhea, constipation, blood in stool and abdominal distention.  Genitourinary: Denies dysuria, urgency, frequency, hematuria, flank pain and difficulty urinating.  Endocrine: Denies: hot or cold intolerance, sweats, changes in hair or nails, polyuria, polydipsia. Musculoskeletal: Denies back pain, joint swelling, arthralgias and gait problem.  Skin: Denies pallor, rash and wound.  Neurological: Denies seizures, syncope,  numbness and headaches.  Hematological:  Denies adenopathy. Easy bruising, personal or family bleeding history  Psychiatric/Behavioral: Denies suicidal ideation, mood changes, confusion, nervousness, sleep disturbance and agitation   Past Medical History:  Diagnosis Date  . ALLERGIC RHINITIS 03/04/2008  . Anxiety   . DEPRESSION 03/04/2008  . Edema    pedal  . EXOGENOUS OBESITY 06/22/2010  . 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 Prolactinoma [facility])     Past Surgical History:  Procedure Laterality Date  . BREAST SURGERY  2004   augmentation  . HAMMER TOE SURGERY    . LIPOSUCTION    . NASAL SINUS SURGERY      Family History  Problem Relation Age of Onset  . Arthritis Mother        osteo  . Allergic rhinitis Mother   . Depression Mother   . Down syndrome Daughter     SOCIAL HX:   reports that she has never smoked. She has never used smokeless tobacco. She reports that she does not drink alcohol and does not use drugs.   Current Outpatient Medications:  .  azelastine (ASTELIN) 0.1 % nasal spray, Can use two sprays in each nostril twice daily as needed for itchy nose and sneezing., Disp: 30 mL, Rfl: 2 .  Bepotastine Besilate (BEPREVE) 1.5 % SOLN, Can use one drop in each eye twice a day for itchy eyes., Disp: 10 mL, Rfl: 5 .  busPIRone (BUSPAR) 15 MG tablet, TAKE ONE TABLET BY MOUTH TWICE A DAY, Disp: 180 tablet, Rfl: 0 .  Cyanocobalamin (VITAMIN B 12 PO), Take by mouth., Disp: , Rfl:  .  diclofenac (CATAFLAM) 50 MG tablet, Take by mouth., Disp: , Rfl:  .  escitalopram (LEXAPRO) 10 MG tablet, TAKE ONE TABLET BY MOUTH DAILY, Disp: 90 tablet, Rfl: 1 .  ferrous sulfate 325 (65  FE) MG tablet, Take 325 mg by mouth daily with breakfast., Disp: , Rfl:  .  fluticasone (FLONASE) 50 MCG/ACT nasal spray, Place 2 sprays into both nostrils daily., Disp: 18.2 g, Rfl: 2 .  ibuprofen (ADVIL) 200 MG tablet, Take 800 mg by mouth every 8 (eight) hours as needed., Disp: , Rfl:  .  levocetirizine (XYZAL) 5 MG tablet, Take 1 tablet (5 mg  total) by mouth every evening., Disp: 90 tablet, Rfl: 1 .  LORazepam (ATIVAN) 0.5 MG tablet, TAKE ONE TABLET BY MOUTH TWICE A DAY AS NEEDED FOR ANXIETY, Disp: 60 tablet, Rfl: 0 .  Multiple Vitamin (MULTI-VITAMINS) TABS, Take by mouth., Disp: , Rfl:  .  Olopatadine HCl (PAZEO) 0.7 % SOLN, Place 1 drop into both eyes 1 day or 1 dose., Disp: 2.5 mL, Rfl: 2 .  ondansetron (ZOFRAN) 4 MG tablet, Take 1 tablet (4 mg total) by mouth every 8 (eight) hours as needed for nausea or vomiting., Disp: 20 tablet, Rfl: 0 .  Probiotic Product (PROBIOTIC-10 PO), Take 1 tablet by mouth daily., Disp: , Rfl:  .  Pyridoxine HCl (VITAMIN B-6 PO), Take by mouth daily., Disp: , Rfl:  .  rizatriptan (MAXALT) 10 MG tablet, TAKE ONE TABLET BY MOUTH AT ONSET OF HEADACHE; MAY REPEAT ONE TABLET IN 2 HOURS IF NEEDED., Disp: 9 tablet, Rfl: 0 .  TURMERIC PO, Take 2 tablets by mouth daily., Disp: , Rfl:  .  venlafaxine XR (EFFEXOR-XR) 150 MG 24 hr capsule, TAKE TWO CAPSULES BY MOUTH DAILY, Disp: 180 capsule, Rfl: 0 .  zolmitriptan (ZOMIG) 5 MG tablet, Take 1 tablet by mouth for migraine headache.  May repeat in 2 hours if needed.  No more than 2 doses in 24-hour period., Disp: 5 tablet, Rfl: 0  EXAM:   VITALS per patient if applicable: None reported  GENERAL: alert, oriented, appears well and in no acute distress  HEENT: atraumatic, conjunttiva clear, no obvious abnormalities on inspection of external nose and ears, wears corrective lenses  NECK: normal movements of the head and neck  LUNGS: on inspection no signs of respiratory distress, breathing rate appears normal, no obvious gross increased work of breathing, gasping or wheezing  CV: no obvious cyanosis  MS: moves all visible extremities without noticeable abnormality  PSYCH/NEURO: pleasant and cooperative, no obvious depression or anxiety, speech and thought processing grossly intact  ASSESSMENT AND PLAN:   COVID-19 virus infection -She has been slowly  improving, it appears she was not referred for monoclonal antibody infusion or remdesivir treatment.  At this point she is outside of the window. -She has been advised to reconsider vaccination 2 weeks out from her positive test which would put her at about January 17. -She will reach out to me for further issues or concerns.     I discussed the assessment and treatment plan with the patient. The patient was provided an opportunity to ask questions and all were answered. The patient agreed with the plan and demonstrated an understanding of the instructions.   The patient was advised to call back or seek an in-person evaluation if the symptoms worsen or if the condition fails to improve as anticipated.    Lelon Frohlich, MD  [facility]

## 2020-10-02 ENCOUNTER — Ambulatory Visit (INDEPENDENT_AMBULATORY_CARE_PROVIDER_SITE_OTHER): Payer: PRIVATE HEALTH INSURANCE | Admitting: *Deleted

## 2020-10-02 DIAGNOSIS — J309 Allergic rhinitis, unspecified: Secondary | ICD-10-CM

## 2020-10-04 ENCOUNTER — Other Ambulatory Visit: Payer: Self-pay | Admitting: Internal Medicine

## 2020-10-04 ENCOUNTER — Other Ambulatory Visit: Payer: Self-pay | Admitting: Psychiatry

## 2020-10-04 DIAGNOSIS — F419 Anxiety disorder, unspecified: Secondary | ICD-10-CM

## 2020-10-04 DIAGNOSIS — Z8669 Personal history of other diseases of the nervous system and sense organs: Secondary | ICD-10-CM

## 2020-10-15 ENCOUNTER — Ambulatory Visit: Payer: PRIVATE HEALTH INSURANCE | Admitting: Psychiatry

## 2020-10-18 ENCOUNTER — Other Ambulatory Visit: Payer: Self-pay | Admitting: Psychiatry

## 2020-10-18 ENCOUNTER — Other Ambulatory Visit: Payer: Self-pay | Admitting: Internal Medicine

## 2020-10-18 DIAGNOSIS — F419 Anxiety disorder, unspecified: Secondary | ICD-10-CM

## 2020-10-18 DIAGNOSIS — Z8669 Personal history of other diseases of the nervous system and sense organs: Secondary | ICD-10-CM

## 2020-10-21 ENCOUNTER — Other Ambulatory Visit: Payer: Self-pay | Admitting: Psychiatry

## 2020-10-21 DIAGNOSIS — F339 Major depressive disorder, recurrent, unspecified: Secondary | ICD-10-CM

## 2020-10-24 ENCOUNTER — Ambulatory Visit (INDEPENDENT_AMBULATORY_CARE_PROVIDER_SITE_OTHER): Payer: PRIVATE HEALTH INSURANCE | Admitting: *Deleted

## 2020-10-24 DIAGNOSIS — J309 Allergic rhinitis, unspecified: Secondary | ICD-10-CM | POA: Diagnosis not present

## 2020-11-03 ENCOUNTER — Other Ambulatory Visit: Payer: Self-pay | Admitting: Internal Medicine

## 2020-11-03 DIAGNOSIS — Z8669 Personal history of other diseases of the nervous system and sense organs: Secondary | ICD-10-CM

## 2020-11-11 DIAGNOSIS — J3089 Other allergic rhinitis: Secondary | ICD-10-CM

## 2020-11-11 NOTE — Progress Notes (Signed)
VIALS EXP 11-11-21 

## 2020-11-18 ENCOUNTER — Ambulatory Visit (INDEPENDENT_AMBULATORY_CARE_PROVIDER_SITE_OTHER): Payer: PRIVATE HEALTH INSURANCE | Admitting: *Deleted

## 2020-11-18 DIAGNOSIS — J309 Allergic rhinitis, unspecified: Secondary | ICD-10-CM | POA: Diagnosis not present

## 2020-11-21 ENCOUNTER — Other Ambulatory Visit: Payer: Self-pay | Admitting: Internal Medicine

## 2020-11-21 DIAGNOSIS — Z8669 Personal history of other diseases of the nervous system and sense organs: Secondary | ICD-10-CM

## 2020-11-28 ENCOUNTER — Ambulatory Visit: Payer: PRIVATE HEALTH INSURANCE | Admitting: Psychiatry

## 2020-12-16 ENCOUNTER — Other Ambulatory Visit: Payer: Self-pay | Admitting: Internal Medicine

## 2020-12-16 DIAGNOSIS — Z8669 Personal history of other diseases of the nervous system and sense organs: Secondary | ICD-10-CM

## 2020-12-17 ENCOUNTER — Ambulatory Visit (INDEPENDENT_AMBULATORY_CARE_PROVIDER_SITE_OTHER): Payer: PRIVATE HEALTH INSURANCE

## 2020-12-17 DIAGNOSIS — J309 Allergic rhinitis, unspecified: Secondary | ICD-10-CM

## 2020-12-19 DIAGNOSIS — M9901 Segmental and somatic dysfunction of cervical region: Secondary | ICD-10-CM | POA: Diagnosis not present

## 2020-12-19 DIAGNOSIS — M9903 Segmental and somatic dysfunction of lumbar region: Secondary | ICD-10-CM | POA: Diagnosis not present

## 2020-12-19 DIAGNOSIS — M9905 Segmental and somatic dysfunction of pelvic region: Secondary | ICD-10-CM | POA: Diagnosis not present

## 2020-12-19 DIAGNOSIS — M9902 Segmental and somatic dysfunction of thoracic region: Secondary | ICD-10-CM | POA: Diagnosis not present

## 2020-12-22 DIAGNOSIS — M9902 Segmental and somatic dysfunction of thoracic region: Secondary | ICD-10-CM | POA: Diagnosis not present

## 2020-12-22 DIAGNOSIS — M9905 Segmental and somatic dysfunction of pelvic region: Secondary | ICD-10-CM | POA: Diagnosis not present

## 2020-12-22 DIAGNOSIS — M9903 Segmental and somatic dysfunction of lumbar region: Secondary | ICD-10-CM | POA: Diagnosis not present

## 2020-12-22 DIAGNOSIS — M9901 Segmental and somatic dysfunction of cervical region: Secondary | ICD-10-CM | POA: Diagnosis not present

## 2020-12-25 DIAGNOSIS — M9902 Segmental and somatic dysfunction of thoracic region: Secondary | ICD-10-CM | POA: Diagnosis not present

## 2020-12-25 DIAGNOSIS — M9905 Segmental and somatic dysfunction of pelvic region: Secondary | ICD-10-CM | POA: Diagnosis not present

## 2020-12-25 DIAGNOSIS — M9903 Segmental and somatic dysfunction of lumbar region: Secondary | ICD-10-CM | POA: Diagnosis not present

## 2020-12-25 DIAGNOSIS — M9901 Segmental and somatic dysfunction of cervical region: Secondary | ICD-10-CM | POA: Diagnosis not present

## 2020-12-29 DIAGNOSIS — M9901 Segmental and somatic dysfunction of cervical region: Secondary | ICD-10-CM | POA: Diagnosis not present

## 2020-12-29 DIAGNOSIS — M9903 Segmental and somatic dysfunction of lumbar region: Secondary | ICD-10-CM | POA: Diagnosis not present

## 2020-12-29 DIAGNOSIS — M9902 Segmental and somatic dysfunction of thoracic region: Secondary | ICD-10-CM | POA: Diagnosis not present

## 2020-12-29 DIAGNOSIS — M9905 Segmental and somatic dysfunction of pelvic region: Secondary | ICD-10-CM | POA: Diagnosis not present

## 2020-12-31 ENCOUNTER — Other Ambulatory Visit: Payer: Self-pay | Admitting: Psychiatry

## 2020-12-31 DIAGNOSIS — F419 Anxiety disorder, unspecified: Secondary | ICD-10-CM

## 2021-01-01 DIAGNOSIS — M9905 Segmental and somatic dysfunction of pelvic region: Secondary | ICD-10-CM | POA: Diagnosis not present

## 2021-01-01 DIAGNOSIS — M9901 Segmental and somatic dysfunction of cervical region: Secondary | ICD-10-CM | POA: Diagnosis not present

## 2021-01-01 DIAGNOSIS — M9903 Segmental and somatic dysfunction of lumbar region: Secondary | ICD-10-CM | POA: Diagnosis not present

## 2021-01-01 DIAGNOSIS — M9902 Segmental and somatic dysfunction of thoracic region: Secondary | ICD-10-CM | POA: Diagnosis not present

## 2021-01-01 NOTE — Telephone Encounter (Signed)
LM TCB for an appt

## 2021-01-05 DIAGNOSIS — M9901 Segmental and somatic dysfunction of cervical region: Secondary | ICD-10-CM | POA: Diagnosis not present

## 2021-01-05 DIAGNOSIS — M9905 Segmental and somatic dysfunction of pelvic region: Secondary | ICD-10-CM | POA: Diagnosis not present

## 2021-01-05 DIAGNOSIS — M9903 Segmental and somatic dysfunction of lumbar region: Secondary | ICD-10-CM | POA: Diagnosis not present

## 2021-01-05 DIAGNOSIS — M9902 Segmental and somatic dysfunction of thoracic region: Secondary | ICD-10-CM | POA: Diagnosis not present

## 2021-01-12 ENCOUNTER — Other Ambulatory Visit: Payer: Self-pay | Admitting: Internal Medicine

## 2021-01-12 DIAGNOSIS — Z8669 Personal history of other diseases of the nervous system and sense organs: Secondary | ICD-10-CM

## 2021-01-12 NOTE — Telephone Encounter (Signed)
Pt is calling in to let us know that she has switched her pharmacy due to new insurance [employer] and the new pharmacy is [facility] on [address] in [place], [place]  [place].  Pt is needing Rx rizatriptan (MAXALT) 10 MG sent to the new pharmacy.

## 2021-01-14 MED ORDER — RIZATRIPTAN BENZOATE 10 MG PO TABS: ORAL_TABLET | ORAL | 0 refills | Status: DC

## 2021-01-15 ENCOUNTER — Ambulatory Visit (INDEPENDENT_AMBULATORY_CARE_PROVIDER_SITE_OTHER): Payer: PRIVATE HEALTH INSURANCE | Admitting: *Deleted

## 2021-01-15 ENCOUNTER — Other Ambulatory Visit: Payer: Self-pay | Admitting: Psychiatry

## 2021-01-15 DIAGNOSIS — J309 Allergic rhinitis, unspecified: Secondary | ICD-10-CM | POA: Diagnosis not present

## 2021-01-15 DIAGNOSIS — F339 Major depressive disorder, recurrent, unspecified: Secondary | ICD-10-CM

## 2021-01-16 DIAGNOSIS — M9901 Segmental and somatic dysfunction of cervical region: Secondary | ICD-10-CM | POA: Diagnosis not present

## 2021-01-16 DIAGNOSIS — M9905 Segmental and somatic dysfunction of pelvic region: Secondary | ICD-10-CM | POA: Diagnosis not present

## 2021-01-16 DIAGNOSIS — M9902 Segmental and somatic dysfunction of thoracic region: Secondary | ICD-10-CM | POA: Diagnosis not present

## 2021-01-16 DIAGNOSIS — M9903 Segmental and somatic dysfunction of lumbar region: Secondary | ICD-10-CM | POA: Diagnosis not present

## 2021-01-16 NOTE — Telephone Encounter (Signed)
Please schedule appt

## 2021-01-20 DIAGNOSIS — M9903 Segmental and somatic dysfunction of lumbar region: Secondary | ICD-10-CM | POA: Diagnosis not present

## 2021-01-20 DIAGNOSIS — M9901 Segmental and somatic dysfunction of cervical region: Secondary | ICD-10-CM | POA: Diagnosis not present

## 2021-01-20 DIAGNOSIS — M9905 Segmental and somatic dysfunction of pelvic region: Secondary | ICD-10-CM | POA: Diagnosis not present

## 2021-01-20 DIAGNOSIS — M9902 Segmental and somatic dysfunction of thoracic region: Secondary | ICD-10-CM | POA: Diagnosis not present

## 2021-01-20 NOTE — Telephone Encounter (Signed)
LMTCB for appt.

## 2021-01-20 NOTE — Telephone Encounter (Signed)
noted 

## 2021-01-22 ENCOUNTER — Other Ambulatory Visit: Payer: Self-pay

## 2021-01-22 ENCOUNTER — Ambulatory Visit (INDEPENDENT_AMBULATORY_CARE_PROVIDER_SITE_OTHER): Payer: PRIVATE HEALTH INSURANCE | Admitting: *Deleted

## 2021-01-22 DIAGNOSIS — J309 Allergic rhinitis, unspecified: Secondary | ICD-10-CM | POA: Diagnosis not present

## 2021-01-22 DIAGNOSIS — M9901 Segmental and somatic dysfunction of cervical region: Secondary | ICD-10-CM | POA: Diagnosis not present

## 2021-01-22 DIAGNOSIS — M9902 Segmental and somatic dysfunction of thoracic region: Secondary | ICD-10-CM | POA: Diagnosis not present

## 2021-01-22 DIAGNOSIS — M9905 Segmental and somatic dysfunction of pelvic region: Secondary | ICD-10-CM | POA: Diagnosis not present

## 2021-01-22 DIAGNOSIS — M9903 Segmental and somatic dysfunction of lumbar region: Secondary | ICD-10-CM | POA: Diagnosis not present

## 2021-01-23 ENCOUNTER — Encounter: Payer: Self-pay | Admitting: Internal Medicine

## 2021-01-23 ENCOUNTER — Ambulatory Visit: Payer: BC Managed Care – PPO | Admitting: Internal Medicine

## 2021-01-23 ENCOUNTER — Other Ambulatory Visit: Payer: Self-pay | Admitting: Internal Medicine

## 2021-01-23 VITALS — BP 110/80 | HR 64 | Temp 98.0°F | Wt 179.0 lb

## 2021-01-23 DIAGNOSIS — E7800 Pure hypercholesterolemia, unspecified: Secondary | ICD-10-CM

## 2021-01-23 DIAGNOSIS — F339 Major depressive disorder, recurrent, unspecified: Secondary | ICD-10-CM | POA: Diagnosis not present

## 2021-01-23 DIAGNOSIS — E559 Vitamin D deficiency, unspecified: Secondary | ICD-10-CM

## 2021-01-23 DIAGNOSIS — Z1211 Encounter for screening for malignant neoplasm of colon: Secondary | ICD-10-CM | POA: Diagnosis not present

## 2021-01-23 DIAGNOSIS — F332 Major depressive disorder, recurrent severe without psychotic features: Secondary | ICD-10-CM | POA: Diagnosis not present

## 2021-01-23 DIAGNOSIS — Z1231 Encounter for screening mammogram for malignant neoplasm of breast: Secondary | ICD-10-CM | POA: Diagnosis not present

## 2021-01-23 DIAGNOSIS — E785 Hyperlipidemia, unspecified: Secondary | ICD-10-CM

## 2021-01-23 DIAGNOSIS — Z8669 Personal history of other diseases of the nervous system and sense organs: Secondary | ICD-10-CM

## 2021-01-23 LAB — CBC WITH DIFFERENTIAL/PLATELET
Basophils Absolute: 0 10*3/uL (ref 0.0–0.1)
Basophils Relative: 0.8 % (ref 0.0–3.0)
Eosinophils Absolute: 0.4 10*3/uL (ref 0.0–0.7)
Eosinophils Relative: 6.7 % — ABNORMAL HIGH (ref 0.0–5.0)
HCT: 43.4 % (ref 36.0–46.0)
Hemoglobin: 14.8 g/dL (ref 12.0–15.0)
Lymphocytes Relative: 31.3 % (ref 12.0–46.0)
Lymphs Abs: 1.6 10*3/uL (ref 0.7–4.0)
MCHC: 34.2 g/dL (ref 30.0–36.0)
MCV: 88.1 fl (ref 78.0–100.0)
Monocytes Absolute: 0.5 10*3/uL (ref 0.1–1.0)
Monocytes Relative: 9.3 % (ref 3.0–12.0)
Neutro Abs: 2.7 10*3/uL (ref 1.4–7.7)
Neutrophils Relative %: 51.9 % (ref 43.0–77.0)
Platelets: 286 10*3/uL (ref 150.0–400.0)
RBC: 4.93 Mil/uL (ref 3.87–5.11)
RDW: 13.7 % (ref 11.5–15.5)
WBC: 5.3 10*3/uL (ref 4.0–10.5)

## 2021-01-23 LAB — COMPREHENSIVE METABOLIC PANEL
ALT: 29 U/L (ref 0–35)
AST: 17 U/L (ref 0–37)
Albumin: 4.5 g/dL (ref 3.5–5.2)
Alkaline Phosphatase: 91 U/L (ref 39–117)
BUN: 16 mg/dL (ref 6–23)
CO2: 24 mEq/L (ref 19–32)
Calcium: 9.6 mg/dL (ref 8.4–10.5)
Chloride: 104 mEq/L (ref 96–112)
Creatinine, Ser: 0.59 mg/dL (ref 0.40–1.20)
GFR: 102 mL/min (ref >60.00)
Glucose, Bld: 93 mg/dL (ref 70–99)
Potassium: 4.5 mEq/L (ref 3.5–5.1)
Sodium: 139 mEq/L (ref 135–145)
Total Bilirubin: 0.4 mg/dL (ref 0.2–1.2)
Total Protein: 7.1 g/dL (ref 6.0–8.3)

## 2021-01-23 LAB — VITAMIN D 25 HYDROXY (VIT D DEFICIENCY, FRACTURES): VITD: 32.48 ng/mL (ref 30.00–100.00)

## 2021-01-23 LAB — LIPID PANEL
Cholesterol: 256 mg/dL — ABNORMAL HIGH (ref 0–200)
HDL: 55.8 mg/dL (ref >39.00)
NonHDL: 200.19
Total CHOL/HDL Ratio: 5
Triglycerides: 249 mg/dL — ABNORMAL HIGH (ref 0.0–149.0)
VLDL: 49.8 mg/dL — ABNORMAL HIGH (ref 0.0–40.0)

## 2021-01-23 LAB — HEMOGLOBIN A1C: Hgb A1c MFr Bld: 5.6 % (ref 4.6–6.5)

## 2021-01-23 LAB — VITAMIN B12: Vitamin B-12: 485 pg/mL (ref 211–911)

## 2021-01-23 LAB — LDL CHOLESTEROL, DIRECT: Direct LDL: 161 mg/dL

## 2021-01-23 LAB — TSH: TSH: 1.93 u[IU]/mL (ref 0.35–4.50)

## 2021-01-23 MED ORDER — ATORVASTATIN CALCIUM 40 MG PO TABS: 40.0000 mg | ORAL_TABLET | Freq: Every day | ORAL | 1 refills | Status: DC

## 2021-01-23 MED ORDER — RIZATRIPTAN BENZOATE 10 MG PO TABS: ORAL_TABLET | ORAL | 0 refills | Status: DC

## 2021-01-23 MED ORDER — VITAMIN D (ERGOCALCIFEROL) 1.25 MG (50000 UNIT) PO CAPS: 50000.0000 [IU] | ORAL_CAPSULE | ORAL | 0 refills | Status: DC

## 2021-01-23 NOTE — Progress Notes (Signed)
Established Patient Office Visit     This visit occurred during the SARS-CoV-2 public health emergency.  Safety protocols were in place, including screening questions prior to the visit, additional usage of staff PPE, and extensive cleaning of exam room while observing appropriate contact time as indicated for disinfecting solutions.    CC/Reason for Visit: "Pain all over"  HPI: Chelsea Dyer is a 55 y.o. female who is coming in today for the above mentioned reasons.  She has a history of significant depression.  She has not seen her mental health providers since last summer due to cost issues.  She has pain in her back, her legs, her neck, shoulders.  She has been visiting a chiropractor without relief.  She has also been having almost daily migraine headaches.  She is crying nonstop throughout this visit.  She is on Lexapro 10 mg, lorazepam 0.5 mg, Effexor 150 mg.  She is requesting refill of her migraine medication.  Past Medical/Surgical History: Past Medical History:  Diagnosis Date  . ALLERGIC RHINITIS 03/04/2008  . Anxiety   . DEPRESSION 03/04/2008  . Edema    pedal  . EXOGENOUS OBESITY 06/22/2010  . 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 Prolactinoma [facility])     Past Surgical History:  Procedure Laterality Date  . BREAST SURGERY  2004   augmentation  . HAMMER TOE SURGERY    . LIPOSUCTION    . NASAL SINUS SURGERY      Social History:  reports that she has never smoked. She has never used smokeless tobacco. She reports that she does not drink alcohol and does not use drugs.  Allergies: Allergies  Allergen Reactions  . Percocet [Oxycodone-Acetaminophen] Other (See Comments)    Interfered with patient's antidepressants; made pt feel dizzy  . Septra [Sulfamethoxazole-Trimethoprim] Rash    Family History:  Family History  Problem Relation Age of Onset  . Arthritis Mother        osteo  . Allergic rhinitis Mother   . Depression Mother   . Down syndrome Daughter       Current Outpatient Medications:  .  azelastine (ASTELIN) 0.1 % nasal spray, Can use two sprays in each nostril twice daily as needed for itchy nose and sneezing., Disp: 30 mL, Rfl: 2 .  Bepotastine Besilate (BEPREVE) 1.5 % SOLN, Can use one drop in each eye twice a day for itchy eyes., Disp: 10 mL, Rfl: 5 .  busPIRone (BUSPAR) 15 MG tablet, TAKE ONE TABLET BY MOUTH TWICE A DAY, Disp: 60 tablet, Rfl: 0 .  Cyanocobalamin (VITAMIN B 12 PO), Take by mouth., Disp: , Rfl:  .  diclofenac (CATAFLAM) 50 MG tablet, Take by mouth., Disp: , Rfl:  .  escitalopram (LEXAPRO) 10 MG tablet, TAKE ONE TABLET BY MOUTH DAILY, Disp: 90 tablet, Rfl: 1 .  ferrous sulfate 325 (65 FE) MG tablet, Take 325 mg by mouth daily with breakfast., Disp: , Rfl:  .  fluticasone (FLONASE) 50 MCG/ACT nasal spray, Place 2 sprays into both nostrils daily., Disp: 18.2 g, Rfl: 2 .  ibuprofen (ADVIL) 200 MG tablet, Take 800 mg by mouth every 8 (eight) hours as needed., Disp: , Rfl:  .  levocetirizine (XYZAL) 5 MG tablet, Take 1 tablet (5 mg total) by mouth every evening., Disp: 90 tablet, Rfl: 1 .  LORazepam (ATIVAN) 0.5 MG tablet, TAKE ONE TABLET BY MOUTH TWICE A DAY AS NEEDED FOR ANXIETY, Disp: 60 tablet, Rfl: 0 .  Multiple Vitamin (MULTI-VITAMINS) TABS, Take by  mouth., Disp: , Rfl:  .  Olopatadine HCl (PAZEO) 0.7 % SOLN, Place 1 drop into both eyes 1 day or 1 dose., Disp: 2.5 mL, Rfl: 2 .  Probiotic Product (PROBIOTIC-10 PO), Take 1 tablet by mouth daily., Disp: , Rfl:  .  Pyridoxine HCl (VITAMIN B-6 PO), Take by mouth daily., Disp: , Rfl:  .  TURMERIC PO, Take 2 tablets by mouth daily., Disp: , Rfl:  .  venlafaxine XR (EFFEXOR-XR) 150 MG 24 hr capsule, TAKE TWO CAPSULES BY MOUTH DAILY, Disp: 180 capsule, Rfl: 0 .  zolmitriptan (ZOMIG) 5 MG tablet, Take 1 tablet by mouth for migraine headache.  May repeat in 2 hours if needed.  No more than 2 doses in 24-hour period., Disp: 5 tablet, Rfl: 0 .  rizatriptan (MAXALT) 10 MG tablet,  May repeat in 2 hours if needed, Disp: 9 tablet, Rfl: 0  Review of Systems:  Constitutional: Denies fever, chills, diaphoresis, appetite change and fatigue.  HEENT: Denies photophobia, eye pain, redness, hearing loss, ear pain, congestion, sore throat, rhinorrhea, sneezing, mouth sores, trouble swallowing, neck pain, neck stiffness and tinnitus.   Respiratory: Denies SOB, DOE, cough, chest tightness,  and wheezing.   Cardiovascular: Denies chest pain, palpitations and leg swelling.  Gastrointestinal: Denies nausea, vomiting, abdominal pain, diarrhea, constipation, blood in stool and abdominal distention.  Genitourinary: Denies dysuria, urgency, frequency, hematuria, flank pain and difficulty urinating.  Endocrine: Denies: hot or cold intolerance, sweats, changes in hair or nails, polyuria, polydipsia. Musculoskeletal: Denies myalgias, back pain, joint swelling, arthralgias and gait problem.  Skin: Denies pallor, rash and wound.  Neurological: Denies dizziness, seizures, syncope, weakness, light-headedness, numbness and headaches.  Hematological: Denies adenopathy. Easy bruising, personal or family bleeding history  Psychiatric/Behavioral: Denies suicidal ideation,  Confusion.   Physical Exam: Vitals:   01/23/21 0907  BP: 110/80  Pulse: 64  Temp: 98 F (36.7 C)  TempSrc: Oral  SpO2: 97%  Weight: 179 lb (81.2 kg)    Body mass index is 32.74 kg/m.   Constitutional: NAD, calm, comfortable, crying throughout the entire exam. Eyes: PERRL, lids and conjunctivae normal ENMT: Mucous membranes are moist. Respiratory: clear to auscultation bilaterally, no wheezing, no crackles. Normal respiratory effort. No accessory muscle use.  Cardiovascular: Regular rate and rhythm, no murmurs / rubs / gallops. No extremity edema.  Neurologic: Grossly intact and nonfocal Psychiatric: Normal judgment and insight. Alert and oriented x 3.  Mood is tearful and depressed   Impression and  Plan:  Depression, recurrent ([place])  - Plan: CBC with Differential/Platelet, Comprehensive metabolic panel, Hemoglobin A1c, Lipid panel, TSH, Vitamin B12, VITAMIN D 25 Hydroxy (Vit-D Deficiency, Fractures) -Check above labs to make sure she does not have any organic cause of chronic pain.  Suspect this is all related to depression.  Have told her that we need to make her mental health a priority.  She will go back to her mental health team including psychiatrist and psychotherapist for further management. -She is requesting a work note for today.  Encounter for screening mammogram for malignant neoplasm of breast  - Plan: MM Digital Screening  Screening for malignant neoplasm of colon  - Plan: Ambulatory referral to Gastroenterology  History of migraine headaches  - Plan: rizatriptan (MAXALT) 10 MG tablet  Time spent: 32 minutes reviewing chart, interviewing and examining patient and discussing plan of care.    Lelon Frohlich, MD  Primary [facility] at [facility]

## 2021-01-24 ENCOUNTER — Other Ambulatory Visit: Payer: Self-pay | Admitting: Psychiatry

## 2021-01-24 DIAGNOSIS — F419 Anxiety disorder, unspecified: Secondary | ICD-10-CM

## 2021-01-26 DIAGNOSIS — M25551 Pain in right hip: Secondary | ICD-10-CM | POA: Insufficient documentation

## 2021-01-26 NOTE — Telephone Encounter (Signed)
Please review pt has not been seen since 03/26/20

## 2021-01-26 NOTE — Telephone Encounter (Signed)
OK 

## 2021-01-30 ENCOUNTER — Other Ambulatory Visit: Payer: Self-pay

## 2021-01-30 ENCOUNTER — Ambulatory Visit (INDEPENDENT_AMBULATORY_CARE_PROVIDER_SITE_OTHER): Payer: BC Managed Care – PPO | Admitting: Psychiatry

## 2021-01-30 DIAGNOSIS — F419 Anxiety disorder, unspecified: Secondary | ICD-10-CM | POA: Diagnosis not present
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
## 2021-02-01 ENCOUNTER — Other Ambulatory Visit: Payer: Self-pay | Admitting: Internal Medicine

## 2021-02-01 ENCOUNTER — Other Ambulatory Visit: Payer: Self-pay | Admitting: Psychiatry

## 2021-02-01 DIAGNOSIS — F419 Anxiety disorder, unspecified: Secondary | ICD-10-CM

## 2021-02-01 DIAGNOSIS — Z8669 Personal history of other diseases of the nervous system and sense organs: Secondary | ICD-10-CM

## 2021-02-02 ENCOUNTER — Telehealth: Payer: Self-pay | Admitting: Internal Medicine

## 2021-02-02 NOTE — Telephone Encounter (Signed)
Pt is calling the office back to get her lab results.

## 2021-02-03 ENCOUNTER — Other Ambulatory Visit: Payer: Self-pay | Admitting: Internal Medicine

## 2021-02-03 DIAGNOSIS — E559 Vitamin D deficiency, unspecified: Secondary | ICD-10-CM

## 2021-02-03 DIAGNOSIS — E785 Hyperlipidemia, unspecified: Secondary | ICD-10-CM

## 2021-02-03 NOTE — Telephone Encounter (Signed)
Left message on machine for patient to return our call.  See lab result note. 

## 2021-02-04 ENCOUNTER — Ambulatory Visit (INDEPENDENT_AMBULATORY_CARE_PROVIDER_SITE_OTHER): Payer: BC Managed Care – PPO

## 2021-02-04 ENCOUNTER — Ambulatory Visit (INDEPENDENT_AMBULATORY_CARE_PROVIDER_SITE_OTHER): Payer: BC Managed Care – PPO | Admitting: Psychiatry

## 2021-02-04 ENCOUNTER — Other Ambulatory Visit: Payer: Self-pay

## 2021-02-04 DIAGNOSIS — J309 Allergic rhinitis, unspecified: Secondary | ICD-10-CM

## 2021-02-04 DIAGNOSIS — F419 Anxiety disorder, unspecified: Secondary | ICD-10-CM | POA: Diagnosis not present

## 2021-02-04 NOTE — Progress Notes (Signed)
[facility] Counselor/Therapist Progress Note  Patient ID: Chelsea Dyer, MRN: [id number],    Date: 02/04/2021  Time Spent: 60 minutes   Treatment Type: Individual Therapy  Reported Symptoms: anxiety, frustration "extreme" re: work  Mental Status Exam:  Appearance:   Casual     Behavior:  Appropriate, Sharing and Motivated  Motor:  Normal  Speech/Language:   Clear and Coherent  Affect:  anxiety  Mood:  anxious  Thought process:  some tangentiality  Thought content:    some obsessiveness  Sensory/Perceptual disturbances:    WNL  Orientation:  oriented to person, place, time/date, situation, day of week, month of year and year  Attention:  Fair  Concentration:  Good and Fair  Memory:  [place] of knowledge:   Good  Insight:    Good and Fair  Judgment:   Good and Fair  Impulse Control:  Good and Fair   Risk Assessment: Danger to Self:  No Self-injurious Behavior: No Danger to Others: No Duty to Warn:no Physical Aggression / Violence:No  Access to Firearms a concern: No  Gang Involvement:No   Subjective:  Patient in today reporting anxiety, depression, anger, and frustrations mostly regarding work and personal issues. Anxiety still persists but it has decreased "a little" since last appt. Is decreasing her caffeine intake some and that helps her anxiety.  Also following up from recommended homework focusing on her anxious/negative thought in being able to interrupt them more quickly and replace with more reality-based though patterns. This is difficult for patient but she is making efforts and gave some examples in session today. Also processed some concerns re: husband and former husband, sharing how each has impacted her current situation and her downs-syndrome daughter. States she does try to "look on the bright side of things even when it's difficult." States she has followed through on trying to stop jumping to conclusions in situations and assuming the worst case  scenarios, and trying to trust more. Feeling more grounded by session end and states she's feeling more motivated to "work on my issues with anxiety and depression and anger" and "I'm realizing more I can't control others and can only change myself.     Interventions: Solution-Oriented/Positive Psychology, Ego-Supportive and Insight-Oriented  Diagnosis:   ICD-10-CM   1. Anxiety  F41.9       Treatment Goal Plan:  Patient not signing Treatment Plan on computer screen due to Covid. Treatment Goals: Goals remain on Treatment Plan as patient works on strategies to achieve her goals. Progress will be noted each session in "Progress" section of tx plan. Long term goal: Reduce overall level, frequency, and intensity of the anxiety so that daily functioning is not impaired. Short term goal: Identify, challenge,and replace anxious/depressive/negative thoughts and self talk with more positive, realistic, and empowering thoughts and self talk that do not support anxiety nor depression. Strategies: 1)Teach patient to implement "thought stopping" techniques regarding anxieties/fears/and worries thatmayhave been addressed but still persist. 2)Review helpful behaviors and strategies and have patient go through the process of: Review, repeat, and reinforce success    Progress / Plan: Patient showing good motivation in session today after not having felt very motivated during the week.  She was very open and followed up well on some of her homework which we discussed as noted above.  She is to continue her homework between now and next session.  Also encouraged her to get outside daily and walk, to practice more consistently positive self talk and  self-care, practice interrupting the anxious/depressive thoughts and replacing it with more realistic and empowering thoughts, stay in the present and focus on what she can control or change, decrease her caffeine intake, stay in touch with people who are  supportive, have clear boundaries with others as needed, intentionally look for more positives versus negatives, notice the positives within herself and feel good about them, practice being more patient, stop self negating, use breathing exercises discussed last session and again today when feeling more anxious, let her faith be of emotional support for her as well as spiritual, and to recognize the strength she is showing and working with her goal-directed behaviors trying to let go of the past in order to move forward in a more positive direction.   Goal review and progress/challenges noted with patient.  Next appointment within 2 weeks.   Shanon Ace, LCSW

## 2021-02-05 DIAGNOSIS — M7062 Trochanteric bursitis, left hip: Secondary | ICD-10-CM | POA: Insufficient documentation

## 2021-02-05 DIAGNOSIS — M7061 Trochanteric bursitis, right hip: Secondary | ICD-10-CM | POA: Diagnosis not present

## 2021-02-11 ENCOUNTER — Ambulatory Visit (INDEPENDENT_AMBULATORY_CARE_PROVIDER_SITE_OTHER): Payer: BC Managed Care – PPO

## 2021-02-11 DIAGNOSIS — M25552 Pain in left hip: Secondary | ICD-10-CM | POA: Diagnosis not present

## 2021-02-11 DIAGNOSIS — J309 Allergic rhinitis, unspecified: Secondary | ICD-10-CM

## 2021-02-11 DIAGNOSIS — M25551 Pain in right hip: Secondary | ICD-10-CM | POA: Diagnosis not present

## 2021-02-18 ENCOUNTER — Other Ambulatory Visit: Payer: Self-pay

## 2021-02-18 ENCOUNTER — Ambulatory Visit (INDEPENDENT_AMBULATORY_CARE_PROVIDER_SITE_OTHER): Payer: BC Managed Care – PPO | Admitting: Psychiatry

## 2021-02-18 ENCOUNTER — Ambulatory Visit (INDEPENDENT_AMBULATORY_CARE_PROVIDER_SITE_OTHER): Payer: BC Managed Care – PPO

## 2021-02-18 DIAGNOSIS — J309 Allergic rhinitis, unspecified: Secondary | ICD-10-CM | POA: Diagnosis not present

## 2021-02-18 DIAGNOSIS — F339 Major depressive disorder, recurrent, unspecified: Secondary | ICD-10-CM | POA: Diagnosis not present

## 2021-02-18 NOTE — Progress Notes (Signed)
[facility] Counselor/Therapist Progress Note  Patient ID: Chelsea Dyer, MRN: [id number],    Date: 02/18/2021  Time Spent: 60 minutes  Treatment Type: Individual Therapy  Reported Symptoms: anxiety,"worries", some fearful thoughts "with my anxiety", frustration, anger. Denies any depression at this time.  Mental Status Exam:  Appearance:   Casual     Behavior:  Appropriate, Sharing and Motivated  Motor:  Normal  Speech/Language:   Clear and Coherent  Affect:  anxious, frustrations  Mood:  anxious  Thought process:  anxious  Thought content:    some obsessiveness  Sensory/Perceptual disturbances:    WNL  Orientation:  oriented to person, place, time/date, situation, day of week, month of year and year  Attention:  Good  Concentration:  Good and Fair  Memory:  [place] of knowledge:   Good  Insight:    Good and Fair  Judgment:   Good  Impulse Control:  Good and Fair   Risk Assessment: Danger to Self:  No Self-injurious Behavior: No Danger to Others: No Duty to Warn:no Physical Aggression / Violence:No  Access to Firearms a concern: No  Gang Involvement:No   Subjective:  Patient in today reporting anxiety, frustrations, anger, "but no depression".  Anxiety related to personal/family issues and work. Frustrations and anger "are totally related to work stress and situations." Very frustrated with her job and is now looking for new job and actually had recent interview. Very stressed today about work and in some other interactions, and having a lot of anxious/"worrisome" thoughts, often assuming the worst case scenarios. No further decrease in anxiety. Continues to decrease her intake of caffeine which she feels helps her anxiety. Discussed several of her most current and frequent anxious/worrisome thoughts and worked with them using some CBT and solution focused therapy,  along with the strategies and short term goal in her tx plan below to work on recognizing more quickly  some of her anxious/negative/"worrisome" thoughts and interrupt them and replace with more realistic and empowering thoughts that do not feed her anxiety nor worrying.  Patient worked well with this but also expressed concerns that outside in her regular environment "this will be harder".  Acknowledged some truth to that with patient and also pointed out her motivation and follow-through is good and that that will help her apply these strategies in her daily life, and encouraged her that it will get easier the more she practices new ways of managing things differently.  Patient also shared some additional concerns and tearfulness regarding her Down syndrome daughter and her former husband.  She seems to be doing the best she can with this some of those issues and they are fortunate that there is outside help that the family is eligible for and receiving.  Patient did follow through on some homework and trying to stop jumping to conclusions and situations and stop assuming worst case scenarios, but adds that she really needs to keep working on this in order for it to become more natural for her.  Patient was less anxious and more grounded by the end of session.  To continue practicing some of the strategies we worked with today and focus on the fact she cannot control others but can control/change herself.    Interventions: Cognitive Behavioral Therapy, Solution-Oriented/Positive Psychology and Ego-Supportive  Diagnosis:   ICD-10-CM   1. Depression, recurrent ([place])  F33.9     Treatment goal plan:  Patient not signing Treatment Plan on computer screen due to Covid.  Treatment Goals: Goals remain on Treatment Plan as patient works on strategies to achieve her goals. Progress will be noted each session in "Progress" section of tx plan. Long term goal: Reduce overall level, frequency, and intensity of the anxiety so that daily functioning is not impaired. Short term goal: Identify, challenge,and  replace anxious/depressive/negative thoughts and self talk with more positive, realistic, and empowering thoughts and self talk that do not support anxiety nor depression. Strategies: 1)Teach patient to implement "thought stopping" techniques regarding anxieties/fears/and worries thatmayhave been addressed but still persist. 2)Review helpful behaviors and strategies and have patient go through the process of: Review, repeat, and reinforce success    Progress/plan: Patient today showing good motivation for working on her treatment goals and did well today.  Also encouraged patient to practice more consistent positive self talk and self-care, to get outside daily and walk, to practice interrupting her anxious/depressive/worrisome thoughts and replace them with more realistic and empowering thoughts, to stay in touch with people who are supportive of her, stay in the present and focus on what she can control or change, have clear boundaries with others as needed, continue decreasing caffeine intake, attentionally look for more positives than negatives daily, see the positives within herself and feel good about them, use breathing exercises as discussed in sessions when she is feeling anxious and also proactively as discussed in session, being more patient with herself, that her faith be of emotional support, stop self negating, refrain from worst case scenario thinking, and feel good about the strength she is showing as she works with goal-directed behaviors in the midst of challenging and often changing circumstances and trying to move forward in a more positive direction for improved overall emotional health.   Goal review and progress/challenges noted with patient.  Next appointment within 2 to 3 weeks.   Shanon Ace, LCSW

## 2021-02-24 ENCOUNTER — Other Ambulatory Visit: Payer: Self-pay

## 2021-02-24 ENCOUNTER — Ambulatory Visit: Payer: BC Managed Care – PPO | Admitting: Psychiatry

## 2021-02-24 DIAGNOSIS — F339 Major depressive disorder, recurrent, unspecified: Secondary | ICD-10-CM

## 2021-02-24 NOTE — Progress Notes (Signed)
[facility] Counselor/Therapist Progress Note  Patient ID: Chelsea Dyer, MRN: [id number],    Date: 02/24/2021  Time Spent: 60 minutes   Treatment Type: Individual Therapy  Reported Symptoms: anxiety, depression, anger  Mental Status Exam:  Appearance:   Neat     Behavior:  Appropriate, Sharing, Agitated, and Motivated  Motor:  Normal  Speech/Language:   Clear and Coherent  Affect:  Anxious, agitated, angry, depression  Mood:  angry, anxious, depressed, irritable, and sad  Thought process:  goal directed  Thought content:    Some obsessiveness  Sensory/Perceptual disturbances:    WNL  Orientation:  oriented to person, place, time/date, situation, day of week, month of year, and year  Attention:  Good  Concentration:  Fair  Memory:  Some forgetfulness  Fund of knowledge:   Good  Insight:    Fair  Judgment:   Good and Fair  Impulse Control:  Good and Fair   Risk Assessment: Danger to Self:  No Self-injurious Behavior: No Danger to Others: No Duty to Warn:no Physical Aggression / Violence:No  Access to Firearms a concern: No  Gang Involvement:No   Subjective: Patient in today reporting symptoms of anxiety, depression, frustration, tearfulness and very angry at ex-husband (1st husband) who is father of her daughter. Symptoms mostly regarding personal, work, and family with most of her current anger focused on 1st husband from whom she is divorced and is the father of patient's daughter, regarding a recent incident involving him and daughter.  Patient "hurts for daughter" and discussed this a length in session today as she was quite angry and agitated about it today.  Did express her hurt and anger and was able to vent those feelings freely today and by session end, she was calmer and more grounded.  Processed healthy ways of managing anger more effectively and the need to eventually "let go of the anger in order to move forward." Patient admits that will be difficult but  agrees she needs to work towards letting go. Personal and work situations stirring up anxious/negative thoughts and worked with tx goal plan in interrupting and challenging those thoughts, then replacing with more realistic/empowering thoughts. (Used specific examples of patient's anxious/negative thoughts which seemed to help her.)  Also used some CBT in regards to her working to "let go of negative interactions with 1st husband." To keep decreasing her consumption of caffeine.  Is actually feeling more positive about herself but hard not to compare herself to others, which she is working on as part of her therapy.  Patient to continue working with homework of stopping the tendency to jump to conclusions and assume the worst.  Also to continue working to accept the fact she cannot control or change others and can only control or change herself.  Motivated and encouraged to make her self care and self talk more positive.   Interventions: Solution-Oriented/Positive Psychology and Insight-Oriented  Diagnosis:   ICD-10-CM   1. Depression, recurrent ([place])  F33.9       Plan:  Patient today worked hard in session and showed good motivation.  Encouraged her with some basic behaviors that we agreed last session would be helpful for her in between sessions including getting outside daily and walking, practicing consistent positive self talk and positive self-care, staying in touch with people who are supportive of her, having clear boundaries with others as needed, intentionally looking for more positives than negatives daily, practicing interrupting her anxious/depressive/worrisome thoughts and replacing them with more realistic  and empowering thoughts, staying in the present focused on what she can control or change, seeing the positives within herself and feel good about them, using breathing exercises as discussed in the first session when she is feeling anxious, letting her faith be of emotional support as  well as spiritual, being more patient with herself, stop self negating, stop worst case scenario thinking, and realize the strength she is showing in working with goal-directed behaviors in the midst of challenging situations as she works to gain greater emotional health as she moves forward in a more positive direction.  Goal review and progress/challenges noted with patient.  Next appointment within 1 to 2 weeks.   Shanon Ace, LCSW

## 2021-02-25 DIAGNOSIS — M25551 Pain in right hip: Secondary | ICD-10-CM | POA: Diagnosis not present

## 2021-03-02 ENCOUNTER — Ambulatory Visit: Payer: BC Managed Care – PPO | Admitting: Psychiatry

## 2021-03-02 ENCOUNTER — Telehealth: Payer: Self-pay | Admitting: Internal Medicine

## 2021-03-02 ENCOUNTER — Other Ambulatory Visit: Payer: Self-pay

## 2021-03-02 DIAGNOSIS — F339 Major depressive disorder, recurrent, unspecified: Secondary | ICD-10-CM | POA: Diagnosis not present

## 2021-03-02 NOTE — Telephone Encounter (Signed)
Pt call and stated she need a call back about her vit-D.

## 2021-03-02 NOTE — Progress Notes (Signed)
[facility] Counselor/Therapist Progress Note  Patient ID: Chelsea Dyer, MRN: [id number],    Date: 03/02/2021  Time Spent: 50 minutes   Treatment Type: Individual Therapy  Reported Symptoms: Anxiety, depression, frustration.  Less sadness and less anger.   Mental Status Exam:  Appearance:   Casual     Behavior:  Appropriate, Sharing, and Motivated  Motor:  Normal  Speech/Language:   Clear and Coherent  Affect:  Anxious, depressed  Mood:  anxious and depressed  Thought process:  goal directed  Thought content:    Some obssessiveness  Sensory/Perceptual disturbances:    WNL  Orientation:  oriented to person, place, time/date, situation, day of week, month of year, and year  Attention:  Good  Concentration:  Fair  Memory:  [place] of knowledge:   Good  Insight:    Good and Fair  Judgment:   Good  Impulse Control:  Good and Fair   Risk Assessment: Danger to Self:  No Self-injurious Behavior: No Danger to Others: No Duty to Warn:no Physical Aggression / Violence:No  Access to Firearms a concern: No  Gang Involvement:No   Subjective:  Patient in today and reports symptoms of anxiety, depression, and frustration.  Less tearfulness and less anger today. Her anxiety, depression, and frustration are related mostly to personal, work, and family issues.  Got caught up in a banking scam but was able to interrupt the transaction before her bank account was impacted, and feels she has learned to be more careful. Processed her feelings of anxiety and depression related mostly to her daughter who has Downs-Syndrome and is overweight, and her efforts to help daughter and herself be healthier. Difficult and daughter doesn't recognize the seriousness of her weight gain. Patient is trying to coordinate some exercise as well as some healthy nutritional education for daughter (on a level that daughter can understand). Work is still challenging and is using some CBT and Solution Focused  therapy in working on the things at work she can change versus cannot change, which patient found helpful, using specific examples with patient.  Overall mood is showing some progress today especially with being less angry.  She is encouraged to continue working on interrupting her tendency to jump to conclusions especially in work-related situations and within relationships.    Interventions: Cognitive Behavioral Therapy, Solution-Oriented/Positive Psychology, and Insight-Oriented  Diagnosis:   ICD-10-CM   1. Depression, recurrent ([place])  F33.9       Plan:  Patient today showing good effort and motivation.  She has done good follow through in between sessions on both her treatment goal plan as well as other behaviors that seem to keep her in a more positive frame of mind.  These are some basic behaviors that we agreed on in a prior session as she was finding them helpful including: Practicing consistent positive self talk and positive self-care, staying in touch with people who are supportive, get outside daily and walk, keep clear boundaries with other people, practice interrupting her anxious/depressive/worrisome thoughts and replace them with more realistic and empowering thoughts, intentionally look for more positives daily, see the positives within herself and feel good about them, stay in the present focused on what she can control, letting her faith be of emotional support as well as spiritual, use breathing exercises as discussed in prior session when she is feeling anxious or stressed, stop worst case scenario thinking, stop self negating, take breaks throughout the day as she is able, and feel good  about the strength she is showing as she works with goal-directed behaviors in the midst of challenging and often changing situations as she tries to move forward in a more positive direction towards improved emotional health.  Goal review and progress/challenges noted with patient.  Next  appointment within 2 weeks.   Shanon Ace, LCSW

## 2021-03-03 NOTE — Telephone Encounter (Signed)
Left message on machine returning patient's call 

## 2021-03-05 ENCOUNTER — Ambulatory Visit: Payer: PRIVATE HEALTH INSURANCE | Admitting: Psychiatry

## 2021-03-06 DIAGNOSIS — M25551 Pain in right hip: Secondary | ICD-10-CM | POA: Diagnosis not present

## 2021-03-06 DIAGNOSIS — M25552 Pain in left hip: Secondary | ICD-10-CM | POA: Diagnosis not present

## 2021-03-06 NOTE — Telephone Encounter (Signed)
Spoke with patient and she was confused.  She is taking her Vitamin D as prescribed.  She would like to know what dosage of B12 should she be taking?  She is currently taking 2500 mcg and would like to know if she can take it 4 times daily?  Okay to respond via Mychart

## 2021-03-12 ENCOUNTER — Other Ambulatory Visit: Payer: Self-pay

## 2021-03-12 ENCOUNTER — Ambulatory Visit (INDEPENDENT_AMBULATORY_CARE_PROVIDER_SITE_OTHER): Payer: BC Managed Care – PPO | Admitting: Psychiatry

## 2021-03-12 ENCOUNTER — Telehealth: Payer: Self-pay | Admitting: Psychiatry

## 2021-03-12 DIAGNOSIS — F339 Major depressive disorder, recurrent, unspecified: Secondary | ICD-10-CM | POA: Diagnosis not present

## 2021-03-12 DIAGNOSIS — F419 Anxiety disorder, unspecified: Secondary | ICD-10-CM

## 2021-03-12 MED ORDER — BUSPIRONE HCL 15 MG PO TABS: 15.0000 mg | ORAL_TABLET | Freq: Two times a day (BID) | ORAL | 0 refills | Status: DC

## 2021-03-12 MED ORDER — VENLAFAXINE HCL ER 150 MG PO CP24: 300.0000 mg | ORAL_CAPSULE | Freq: Every day | ORAL | 0 refills | Status: DC

## 2021-03-12 NOTE — Progress Notes (Signed)
[facility] Counselor/Therapist Progress Note  Patient ID: Chelsea Dyer, MRN: [id number],    Date: 03/12/2021  Time Spent: 52 minutes   Treatment Type: Individual Therapy  Reported Symptoms: anxiety, depression, tearfulness, tired, lower motivation   Mental Status Exam:  Appearance:   Casual and Neat     Behavior:  Appropriate, Sharing, and reports lower motivation  Motor:  Normal  Speech/Language:   Clear and Coherent  Affect:  Anxious, depressed, tearful  Mood:  anxious and depressed  Thought process:  goal directed  Thought content:    obsessiveness  Sensory/Perceptual disturbances:    WNL  Orientation:  oriented to person, place, time/date, situation, day of week, month of year, year, and stated date of March 12, 2021  Attention:  Fair  Concentration:  Fair  Memory:  [place] of knowledge:   Good  Insight:    Fair  Judgment:   Good and Fair  Impulse Control:  Good and Fair   Risk Assessment: Danger to Self:  No Self-injurious Behavior: No Danger to Others: No Duty to Warn:no Physical Aggression / Violence:No  Access to Firearms a concern: No  Gang Involvement:No   Subjective:  Patient in today reporting she's not been able to work this week due to anxiety which is aggravated "by work, I don't like myself and how I look, frustrated with daughter as she doesn't listen to me, got scammed recently after thinking "that I hung up the phone and cut off scammer before he got all my information", easily influenced by others with unrealistic ideas/thoughts. Having personal, family, and work issues that are impacting her. Focused more today on her stressors but also her negative/anxious assumptions she is making, some of which do not seem very based on reality per her report and is easily influenced by people who are not helpful and seem to aggravate her situation and what all she's feeling.  Looked at some specifics self-care steps for the immediate future. Patient reports  not taking her medication recently as she ran out and she is following up on that today with her med provider. Does not return to work until Saturday so has a couple more days off which she feels will help also. Reports her Downs-Syndrome daughter is doing ok patient is planning to have some quality time with her these next couple of days and refrain from being around people/situations that she knows escalates her anxiety.  Reviewed again some of the things that she can change versus some of the things that she cannot change and encouraged her to focus more on the things that she can change.  Also encouraged her to work further on interrupting her tendency to jump to conclusions or worst case scenarios especially within relationships and also work-related situations.   Interventions: Cognitive Behavioral Therapy, Solution-Oriented/Positive Psychology, and Ego-Supportive  Diagnosis:   ICD-10-CM   1. Depression, recurrent ([place])  F33.9       Plan:  Patient today showing some motivation and it did increase a little bit over the time of the session.  Reviewed some of the more basic behaviors that we have agreed in previous sessions she has found helpful including practicing consistent positive self talk and positive self-care, staying in touch with people who are supportive, getting outside daily and walking, having clear boundaries with others as needed, practicing interrupting her anxious/depressive/negative thoughts and replacing them with more reality based and empowering thoughts, Intentionally look for more positives daily, see the positives within herself  and feel good about them, let her faith be of emotional support as well as spiritual, use breathing exercises as a tool when feeling more anxious and stressed, stay in the present focused on what she can control, take breaks as she can throughout the day, stop worst case scenario thinking, stop self negating, and recognize the strength she shows when  she works with goal-directed behaviors in the midst of challenging situations as she tries to move forward in a more positive direction.  Goal review and progress/challenges noted with patient.  Next appointment within 2 weeks.   Shanon Ace, LCSW

## 2021-03-12 NOTE — Telephone Encounter (Signed)
Rx sent 

## 2021-03-12 NOTE — Telephone Encounter (Signed)
Pt made an appt for 8/4 but needs refills on all her medicines buspar ativan and effexor xr sent to the [facility] on [address]

## 2021-03-13 NOTE — Telephone Encounter (Signed)
Pt left a message that she has changed her pharmacy to  [facility] on [address]. Please send all scripts there from now on

## 2021-03-17 ENCOUNTER — Other Ambulatory Visit: Payer: Self-pay | Admitting: Internal Medicine

## 2021-03-17 ENCOUNTER — Other Ambulatory Visit: Payer: Self-pay

## 2021-03-17 ENCOUNTER — Ambulatory Visit (INDEPENDENT_AMBULATORY_CARE_PROVIDER_SITE_OTHER): Payer: BC Managed Care – PPO | Admitting: Psychiatry

## 2021-03-17 DIAGNOSIS — F339 Major depressive disorder, recurrent, unspecified: Secondary | ICD-10-CM | POA: Diagnosis not present

## 2021-03-17 DIAGNOSIS — M25551 Pain in right hip: Secondary | ICD-10-CM | POA: Diagnosis not present

## 2021-03-17 DIAGNOSIS — Z8669 Personal history of other diseases of the nervous system and sense organs: Secondary | ICD-10-CM

## 2021-03-17 NOTE — Progress Notes (Signed)
[facility] Counselor/Therapist Progress Note  Patient ID: Chelsea Dyer, MRN: [id number],    Date: 03/17/2021  Time Spent: 58 minutes   Treatment Type: Individual Therapy  Reported Symptoms: anxiety, no depression nor anger  Mental Status Exam:  Appearance:   Casual     Behavior:  Appropriate, Sharing, and Motivated  Motor:  Normal  Speech/Language:   Clear and Coherent  Affect:  anxious  Mood:  anxious  Thought process:  normal  Thought content:    Some obsessiveness  Sensory/Perceptual disturbances:    WNL  Orientation:  oriented to person, place, time/date, situation, day of week, month of year, year, and stated date of March 17, 2021  Attention:  Fair  Concentration:  Fair  Memory:  [place] of knowledge:   Good  Insight:    Good and Fair  Judgment:   Good  Impulse Control:  Good   Risk Assessment: Danger to Self:  No Self-injurious Behavior: No Danger to Others: No Duty to Warn:no Physical Aggression / Violence:No  Access to Firearms a concern: No  Gang Involvement:No   Subjective:  Patient today reporting some improvement since last appt, and was allowed to work part time past few days which helped. Some improvement in how she views herself. Wants to work part time more often, wants to lose weight, wants to feel better about herself and manage "daily frustrations and family issues" more effectively.  Discussed her desire to lose weight and she reports being motivated to go to a gym with her daughter so they both can workout and get help by personal trainer. Daily frustrations involve "not having the life I used to have as a family" (grief/loss over that), trying to help daughter but it's a real challenge due to her limitations, having a lot to do and not having the time to do it. Reviewed her work from last session on negative/anxious assumptions and she reports that behavior has decreased. Discussed more on specific self-care steps for better managing the  negative/anxious thoughts/assumption which patient has been trying to practice more and she reports some improvement with this and it is a work in progress. Some more quickly identifying problematic anxious thoughts that lead to increased anxiety, which is helping her change them more quickly. Worked on more positive self-talk, and following through with coaching herself  in a more positive direction.    Interventions: Cognitive Behavioral Therapy and Solution-Oriented/Positive Psychology  Diagnosis:   ICD-10-CM   1. Depression, recurrent ([place])  F33.9        Treatment goal plan:  Patient not signing Treatment Plan on computer screen due to Covid. Treatment Goals: Goals remain on Treatment Plan as patient works on strategies to achieve her goals.  Progress will be noted each session in "Progress" section of tx plan. Long term goal: Reduce overall level, frequency, and intensity of the anxiety so that daily functioning is not impaired.  Short term goal: Identify, challenge, and replace anxious/depressive/negative thoughts and self talk with more positive, realistic, and empowering thoughts and self talk that do not support anxiety nor depression.  Strategies: 1)Teach patient to implement "thought stopping" techniques regarding anxieties/fears/and worries that may have been addressed but still persist. 2)Review helpful behaviors and strategies and have patient go through the process of: Review, repeat, and reinforce success    Plan:  Patient today showing good motivation and working with her treatment goal plan (above) more regularly. Shares several things that also help her in positive ways  including some positive quotes on her phone, Darrick Meigs music, religious programs online, going to church as able 1 Sunday a month, and spending time with her pet dog.  Encouraged patient to continue some of the behaviors she has found helpful including practicing consistent positive self talk and positive  self-care, getting outside daily and walking, staying in touch with people who are supportive, staying in the present and focusing on what she can control, having clear boundaries with others as needed, interrupt and replace anxious/depressive/negative thoughts with more reality based and empowering thoughts, intentionally look for more positives daily, find the positives within herself, let her faith be of emotional support as well as spiritual, continue to use breathing exercises when feeling more anxious and stressed, take breaks as she is able throughout the day, stop self negating, stop thinking of worst case scenarios, and feel good about the strength she shows in working with goal-directed behaviors in the midst of challenging and often changing situations as she tries to move forward in a more positive direction of stability and improved emotional health.  Goal review and progress/challenges noted with patient.  Next appt within 2 weeks.   Shanon Ace, LCSW

## 2021-03-18 ENCOUNTER — Other Ambulatory Visit: Payer: Self-pay | Admitting: *Deleted

## 2021-03-18 ENCOUNTER — Ambulatory Visit (INDEPENDENT_AMBULATORY_CARE_PROVIDER_SITE_OTHER): Payer: BC Managed Care – PPO

## 2021-03-18 DIAGNOSIS — Z8669 Personal history of other diseases of the nervous system and sense organs: Secondary | ICD-10-CM

## 2021-03-18 DIAGNOSIS — J309 Allergic rhinitis, unspecified: Secondary | ICD-10-CM | POA: Diagnosis not present

## 2021-03-18 MED ORDER — RIZATRIPTAN BENZOATE 10 MG PO TABS: ORAL_TABLET | ORAL | 0 refills | Status: DC

## 2021-03-19 DIAGNOSIS — M25551 Pain in right hip: Secondary | ICD-10-CM | POA: Diagnosis not present

## 2021-03-23 DIAGNOSIS — M25551 Pain in right hip: Secondary | ICD-10-CM | POA: Diagnosis not present

## 2021-03-27 DIAGNOSIS — M25551 Pain in right hip: Secondary | ICD-10-CM | POA: Diagnosis not present

## 2021-03-31 ENCOUNTER — Other Ambulatory Visit: Payer: Self-pay

## 2021-03-31 ENCOUNTER — Ambulatory Visit (INDEPENDENT_AMBULATORY_CARE_PROVIDER_SITE_OTHER): Payer: BC Managed Care – PPO | Admitting: Psychiatry

## 2021-03-31 DIAGNOSIS — F419 Anxiety disorder, unspecified: Secondary | ICD-10-CM | POA: Diagnosis not present

## 2021-03-31 NOTE — Progress Notes (Signed)
[facility] Counselor/Therapist Progress Note  Patient ID: Chelsea Dyer, MRN: [id number],    Date: 03/31/2021  Time Spent: 58 minutes   Treatment Type: Individual Therapy  Reported Symptoms: anxiety, depression, overwhelmed "with life" (work, home, health issue re: shoulder injury at work)  Mental Status Exam:  Appearance:   Casual and Neat     Behavior:  Appropriate, Sharing, and Motivated  Motor:  Normal  Speech/Language:   Clear and Coherent and Normal Rate  Affect:  Anxious, some depression  Mood:  anxious and depressed  Thought process:  goal directed  Thought content:    Obsessions  Sensory/Perceptual disturbances:    WNL  Orientation:  oriented to person, place, time/date, situation, day of week, month of year, year, and stated date of March 31, 2021  Attention:  Good  Concentration:  Good and Fair  Memory:  WNL  Fund of knowledge:   Good  Insight:    Good and Fair  Judgment:   Good and Fair  Impulse Control:  Good and Fair   Risk Assessment: Danger to Self:  No Self-injurious Behavior: No Danger to Others: No Duty to Warn:no Physical Aggression / Violence:No  Access to Firearms a concern: No  Gang Involvement:No   Subjective:   Patient in today struggling with work and  personal/family problems. Work environment is unhealthy and patient is heavily impacted by this. (Not all details included in note due to patient privacy needs.)  Helped patient work on some better coping skills at work (deep breathing exercises, time-out, positive self talk, and setting limits, leaving work at work) and also encouraged her to make good decisions as she looks for other work. Describes current fears which end  up feeding her anxious/negative/depressive thoughts and worked with these in session today using specific fears and thoughts, along with some CBT and solution-focused strategies, working to interrupt the thoughts and replace with more accurate and encouraging  thoughts, per  treatment plan.  Patient did seem to benefit from this as she seemed more relaxed and grounded by the end of session.  Interventions: Cognitive Behavioral Therapy and Solution-Oriented/Positive Psychology  Diagnosis:   ICD-10-CM   1. Anxiety  F41.9       Treatment goal plan:  Patient not signing Treatment Plan on computer screen due to Covid. Treatment Goals: Goals remain on Treatment Plan as patient works on strategies to achieve her goals.  Progress will be noted each session in "Progress" section of tx plan. Long term goal: Reduce overall level, frequency, and intensity of the anxiety so that daily functioning is not impaired.  Short term goal: Identify, challenge, and replace anxious/depressive/negative thoughts and self talk with more positive, realistic, and empowering thoughts and self talk that do not support anxiety nor depression.  Strategies: 1)Teach patient to implement "thought stopping" techniques regarding anxieties/fears/and worries that may have been addressed but still persist. 2)Review helpful behaviors and strategies and have patient go through the process of: Review, repeat, and reinforce success    Plan:  Patient today showing good motivation and worked well in session even though she has had a difficult few days recently.  Worked well with some CBT and solution focused strategies especially in confronting anxious/fearful thoughts and replacing with more realistic and encouraging thoughts that does not feed her anxiety nor fear.  Continues to use positive quotes on her phone, Darrick Meigs music, religious programs on line and spending time with her dog as sources of emotional comfort.  Encouraged her also  to use some of the behaviors she has found helpful previously including: Getting outside daily and walking, staying in touch with people who are supportive, practicing consistent positive self talk and self-care, staying in the present and focused on what she can control,  interrupting and replacing anxious/depressive/fearful/negative thoughts with more reality based and empowering thoughts, setting clear boundaries with others as needed, intentionally look for more positives daily, let her faith be of emotional support as well as spiritual, look for the positives within herself and believe more in herself, use breathing exercises when feeling more anxious and stressed, take breaks as she is able throughout the day, stop self negating, stop thinking of worst case scenarios, and recognize the strength she is showing in working with goal-directed behaviors in the midst of challenging and often changing circumstances as she tries to move forward in a more positive direction of improved emotional stability and health.  Goal review and progress/challenges noted with patient.  Next appointment within 2 to 3 weeks.   This record has been created using [employer].  Chart creation errors have been sought, but may not always have been located and corrected.  Such creation errors do not reflect on the standard of medical care.   Shanon Ace, LCSW

## 2021-04-01 ENCOUNTER — Telehealth: Payer: Self-pay | Admitting: Internal Medicine

## 2021-04-01 DIAGNOSIS — Z8669 Personal history of other diseases of the nervous system and sense organs: Secondary | ICD-10-CM

## 2021-04-01 DIAGNOSIS — E559 Vitamin D deficiency, unspecified: Secondary | ICD-10-CM

## 2021-04-02 ENCOUNTER — Other Ambulatory Visit: Payer: Self-pay

## 2021-04-02 ENCOUNTER — Ambulatory Visit (INDEPENDENT_AMBULATORY_CARE_PROVIDER_SITE_OTHER): Payer: BC Managed Care – PPO | Admitting: Psychiatry

## 2021-04-02 DIAGNOSIS — F339 Major depressive disorder, recurrent, unspecified: Secondary | ICD-10-CM | POA: Diagnosis not present

## 2021-04-02 NOTE — Progress Notes (Signed)
[facility] Counselor/Therapist Progress Note  Patient ID: Chelsea Dyer, MRN: [id number],    Date: 04/02/2021  Time Spent: 58 minutes   Treatment Type: Individual Therapy  Reported Symptoms: anxious, depressed, tearful, some prior SI last night while in a crisis re: multiple stressors  Mental Status Exam:  Appearance:   Casual     Behavior:  Appropriate, Sharing, and Motivated  Motor:  Normal  Speech/Language:   Clear and Coherent  Affect:  Anxious, depressed  Mood:  anxious and depressed  Thought process:  goal directed  Thought content:    Some obsessiveness  Sensory/Perceptual disturbances:    WNL  Orientation:  oriented to person, place, time/date, situation, day of week, month of year, year, and stated date of April 02, 2021  Attention:  Good  Concentration:  Good and Fair  Memory:  WNL  Fund of knowledge:   Good  Insight:    Good and Fair  Judgment:   Good and Fair  Impulse Control:  Good and Fair   Risk Assessment: Danger to Self:  No Self-injurious Behavior: No Danger to Others: No Duty to Warn:no Physical Aggression / Violence:No  Access to Firearms a concern: No  Gang Involvement:No   Subjective:   Patient arrived tearfully in crisis today after having a bad night last night to the point she reports she was  having suicidal thoughts (no intent). Named specific stressors including work, Char[employer] fundraiser, family.  Was quite tearful initially and eventually calmed more as she talked of feeling so overwhelmed, depressed, and anxious.  Does not have a lot of emotional support outside of being in therapy.  Struggling with a buildup of feelings of questioning her parenting of her 55 year old Down syndrome daughter, her managing her own affairs and being frustrated that she got targeted by a scam and did not realize it to it was too late, not having very many people in her life that she feels cares about her, anger at ex-husband for not helping more including with issues  about their daughter, frustrated with her work and unhealthy situations there, and feeling alone.  Processing these feelings seem to be a big help to her today and she was very clear by session and that she was not suicidal and would not do anything to hurt herself.  Did advise her that if the thoughts ever return, how she can access care 24/7.  She did seem much calmer, focused,and grounded before she left today.  We reviewed her goals and also some of the coping strategies she has used with benefit including: Deep breathing exercises, getting outside to herself, using CBT to help change her thoughts so as to change how she feels, and the interruption of her anxious/negative/depressive thoughts and replacement of them with more reality-based thoughts that do not feed her depression or anxiety.  Denies any suicidal ideation at end of session.  Interventions: Cognitive Behavioral Therapy and Solution-Oriented/Positive Psychology  Diagnosis:   ICD-10-CM   1. Depression, recurrent ([place])  F33.9         Treatment goal plan:  Patient not signing Treatment Plan on computer screen due to Covid. Treatment Goals: Goals remain on Treatment Plan as patient works on strategies to achieve her goals.  Progress will be noted each session in "Progress" section of tx plan. Long term goal: Reduce overall level, frequency, and intensity of the anxiety so that daily functioning is not impaired.  Short term goal: Identify, challenge, and replace anxious/depressive/negative thoughts and self talk  with more positive, realistic, and empowering thoughts and self talk that do not support anxiety nor depression.  Strategies: 1)Teach patient to implement "thought stopping" techniques regarding anxieties/fears/and worries that may have been addressed but still persist. 2)Review helpful behaviors and strategies and have patient go through the process of: Review, repeat, and reinforce success    Plan:  Patient today came  in in the midst of crisis and was able to vent and share a lot of thoughts that was leading to the feelings she was having.  She did a really good job of this and was able to be much more calm so that we could talk through what all she was experiencing and help her feel more grounded.  Denied any suicidal ideation at the end of session.  Encouraged her to use some behaviors that have proven to be helpful to her previously between sessions including: Using positive quotes on her phone, spending time with her dog as a source of emotional comfort, getting outside daily and walking, stay in the present focused on what she can control, remaining in touch with person at work who is supportive of her, practice consistent positive self talk, interrupt and replace anxious/depressive/fearful/negative thoughts with more encouraging and empowering thoughts, have clear boundaries with others as needed, look for more positives within herself, intentionally look for more positives than negatives daily, that her faith be of emotional support as well as spiritual, believing herself more, use breathing exercises when feeling more anxious and stressed, take breaks throughout the day as she can, stop self negating, stop thinking of worst case scenarios, and feel good about the strength she is showing in working with goal-directed behaviors in the midst of challenging and often changing circumstances as she tries to move forward in a more positive direction of improved emotional stability and health.  Goal review and progress/challenges noted with patient.  Next appt within 2 weeks.    This record has been created using [employer].  Chart creation errors have been sought, but may not always have been located and corrected.  Such creation errors do not reflect on the standard of medical care.   Shanon Ace, LCSW

## 2021-04-03 DIAGNOSIS — M25551 Pain in right hip: Secondary | ICD-10-CM | POA: Diagnosis not present

## 2021-04-03 DIAGNOSIS — M25552 Pain in left hip: Secondary | ICD-10-CM | POA: Diagnosis not present

## 2021-04-03 DIAGNOSIS — M545 Low back pain, unspecified: Secondary | ICD-10-CM | POA: Diagnosis not present

## 2021-04-03 DIAGNOSIS — M9903 Segmental and somatic dysfunction of lumbar region: Secondary | ICD-10-CM | POA: Diagnosis not present

## 2021-04-07 ENCOUNTER — Other Ambulatory Visit: Payer: Self-pay | Admitting: Internal Medicine

## 2021-04-07 ENCOUNTER — Other Ambulatory Visit: Payer: Self-pay | Admitting: Psychiatry

## 2021-04-07 DIAGNOSIS — E559 Vitamin D deficiency, unspecified: Secondary | ICD-10-CM

## 2021-04-07 DIAGNOSIS — F339 Major depressive disorder, recurrent, unspecified: Secondary | ICD-10-CM

## 2021-04-07 MED ORDER — RIZATRIPTAN BENZOATE 10 MG PO TABS: ORAL_TABLET | ORAL | 2 refills | Status: DC

## 2021-04-07 NOTE — Addendum Note (Signed)
Addended by: Westley Hummer B on: 04/07/2021 11:07 AM   Modules accepted: Orders

## 2021-04-07 NOTE — Telephone Encounter (Signed)
Rx was not denied:  Receipt confirmed by pharmacy (03/18/2021  3:37 PM EDT)  Will resend

## 2021-04-07 NOTE — Telephone Encounter (Signed)
rizatriptan (MAXALT) 10 MG tablet  Patient called wanting to know why her Rx refill was denied.  [id number] [address] [place]:  [phone] Fax:  [phone]

## 2021-04-14 ENCOUNTER — Ambulatory Visit (INDEPENDENT_AMBULATORY_CARE_PROVIDER_SITE_OTHER): Payer: BC Managed Care – PPO | Admitting: Psychiatry

## 2021-04-14 ENCOUNTER — Other Ambulatory Visit: Payer: Self-pay

## 2021-04-14 DIAGNOSIS — F419 Anxiety disorder, unspecified: Secondary | ICD-10-CM | POA: Diagnosis not present

## 2021-04-14 NOTE — Progress Notes (Signed)
[facility] Counselor/Therapist Progress Note  Patient ID: Chelsea Dyer, MRN: [id number],    Date: 04/14/2021  Time Spent: 58 minutes   Treatment Type: Individual Therapy  Reported Symptoms: anxiety, less depression, discouraged "with myself"  Mental Status Exam:  Appearance:   Casual     Behavior:  Appropriate, Sharing, and Motivated  Motor:  Normal  Speech/Language:   Clear and Coherent  Affect:  Anxious, some depression  Mood:  anxious and depressed  Thought process:  goal directed  Thought content:    Obsessions  Sensory/Perceptual disturbances:    WNL  Orientation:  oriented to person, place, time/date, situation, day of week, month of year, year, and stated date of Aug. 2, 2022  Attention:  Good  Concentration:  Good and Fair  Memory:  WNL  Fund of knowledge:   Good  Insight:    Good and Fair  Judgment:   Good  Impulse Control:  Good   Risk Assessment: Danger to Self:  No Self-injurious Behavior: No Danger to Others: No Duty to Warn:no Physical Aggression / Violence:No  Access to Firearms a concern: No  Gang Involvement:No    Interventions: Cognitive Behavioral Therapy and Solution-Oriented/Positive Psychology  Diagnosis:   ICD-10-CM   1. Anxiety  F41.9           Subjective:   Patient today reporting heightened stress at work, "thought I was doing better and then it was like a bomb went off recently as soon as I walked in the door." Issues with upper level staff that led to hurtful, angry, and frustrated feelings for patient. Has had prior difficulties on this job. Does still remained employed at this point. 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 Also working on better managing stressors of parenting her 55 yr old [place] daughter and  working to feel more self-assured within herself and not compare herself to others. Anxious/fearful thoughts shared and we worked with those in session today using her Tx Goal Plan short term goal and strategies. Seemed to get some relief in her talking and working on these strategies for some of her more frequent anxious/fearful thoughts.  Acknowledges that she needs to decrease her impulsivity at times that she feels is triggered by others.  Discussed how she can react in certain ways that would be more productive for her and for the situations involved, per her description of several incidents that have happened recently. Reviewed again at end of session benefits of some deep breathing exercises, stepping back from certain situations giving herself a chance to think before speaking, getting outside daily and have some time to herself, using CBT strategies to help change her thoughts so as to change her feelings, the interruption of anxious/fearful/negative/depressive thoughts and replacing them with more reality-based thoughts that do not feed her anxiety nor depression.   Interventions: Cognitive Behavioral Therapy and Solution-Oriented/Positive Psychology   Diagnosis:     ICD-10-CM    1. Depression, recurrent ([place]) F33.9                 Treatment goal plan:  Patient not signing Treatment Plan on computer screen due to Covid. Treatment Goals: Goals remain on Treatment Plan as patient works on strategies to achieve her goals.  Progress will be noted each session in "Progress" section  of tx plan. Long term goal: Reduce overall level, frequency, and intensity of the anxiety so that daily functioning is not impaired.  Short term goal: Identify, challenge, and replace anxious/depressive/negative thoughts and self talk with more positive, realistic, and empowering thoughts and self talk that do not support anxiety nor depression.  Strategies: 1)Teach patient to implement "thought stopping"  techniques regarding anxieties/fears/and worries that may have been addressed but still persist. 2)Review helpful behaviors and strategies and have patient go through the process of: Review, repeat, and reinforce success    Plan:  Patient today showing good motivation and engagement in session.  It is hard for her at times to pause her reaction to people or situations and we worked on this some today, supporting the idea of her being able to think before she acts, especially in volatile situations at work.  Encouraged patient to practice some behaviors that have proven to be helpful to her previously between sessions including: Consistent positive self talk, interrupting and replacing anxious/depressive/fearful/and negative thoughts with more encouraging and empowering thoughts, reflecting on the positive quotes she keeps on her phone, spending time with her dog as a source of emotional comfort, getting outside daily and walking, staying in the present focused on what she can control, remaining in touch with person at work who is supportive of her, have clear boundaries with others as needed, look for more positives within herself, let her faith be of emotional support as well as spiritual, look for positives within herself, use breathing exercises at the onset of feeling more anxious and stressed, believing in herself more, taking breaks throughout the day as she is able, stop thinking of worst case scenarios, stop self negating, and recognize the strength she is showing in working with goal-directed behaviors in the midst of challenging circumstances as she tries to move forward in a more positive direction of improved emotional health and stability.   Goal review and progress/challenges noted with patient.  Next appointment within 2 weeks.   Shanon Ace, LCSW

## 2021-04-15 ENCOUNTER — Ambulatory Visit (INDEPENDENT_AMBULATORY_CARE_PROVIDER_SITE_OTHER): Payer: BC Managed Care – PPO | Admitting: Psychiatry

## 2021-04-15 ENCOUNTER — Encounter: Payer: Self-pay | Admitting: Psychiatry

## 2021-04-15 ENCOUNTER — Ambulatory Visit (INDEPENDENT_AMBULATORY_CARE_PROVIDER_SITE_OTHER): Payer: BC Managed Care – PPO | Admitting: *Deleted

## 2021-04-15 DIAGNOSIS — J309 Allergic rhinitis, unspecified: Secondary | ICD-10-CM | POA: Diagnosis not present

## 2021-04-15 DIAGNOSIS — F339 Major depressive disorder, recurrent, unspecified: Secondary | ICD-10-CM | POA: Diagnosis not present

## 2021-04-15 DIAGNOSIS — F419 Anxiety disorder, unspecified: Secondary | ICD-10-CM

## 2021-04-15 MED ORDER — VENLAFAXINE HCL ER 150 MG PO CP24: 300.0000 mg | ORAL_CAPSULE | Freq: Every day | ORAL | 1 refills | Status: DC

## 2021-04-15 MED ORDER — BUSPIRONE HCL 15 MG PO TABS: 15.0000 mg | ORAL_TABLET | Freq: Two times a day (BID) | ORAL | 1 refills | Status: DC

## 2021-04-15 MED ORDER — LORAZEPAM 0.5 MG PO TABS: ORAL_TABLET | ORAL | 3 refills | Status: DC

## 2021-04-15 NOTE — Progress Notes (Signed)
Chelsea Dyer [id number] May 05, 1966 54 y.o.  Subjective:   Patient ID:  Chelsea Dyer is a 55 y.o. (DOB July 15, 1966) female.  Chief Complaint:  Chief Complaint  Patient presents with   Follow-up    Anxiety, depression, and insomnia    HPI Chelsea Dyer presents to the office today for follow-up of anxiety, depression, and insomnia. Chelsea Dyer  was last seen 03/26/20.  PCP started her on Vit D and Chelsea Dyer reports that this has helped with depression. Chelsea Dyer reports that Chelsea Dyer has some anxiety about world events. Chelsea Dyer reports that Chelsea Dyer has some anxiety in response to her work. Chelsea Dyer reports sharp pains in her chest- "pretty much everyday" at work. Chelsea Dyer repots, "I'm always over-thinking." Chelsea Dyer is sleeping well. Appetite has been good. Energy has been good. Motivation has been ok. Concentration has been adequate. Limited time for hobbies and interests. Denies SI.   Now working as a Programmer, systems. Would like to change jobs. Mother moved out and they are estranged. Husband has a guardian and is essentially bedridden. Daughter is doing ok and works part-time at the same store.   Has reduced Diet Cokes from 8-10 a day and now has 1-2 a week. Chelsea Dyer reports that Chelsea Dyer would like to lose weight.   Chelsea Dyer reports that Chelsea Dyer takes Ativan prn on occ when Chelsea Dyer has sharp pains in her chest.   Past Psychiatric Medication Trials: Effexor XR- Has taken over 10 years. Chelsea Dyer reports that it has been helpful for mood and anxiety. Denies side effects. Has not been on doses about 225 mg. Lexapro- Has taken for about a year. Has helped some for depression Prozac Wellbutrin-Ineffective Buspar- Insomnia with doses later in the day. Lorazepam- Helpful Valium May have taken [place] Video Visit from 09/24/2020 in [place] at [facility] from 06/06/2020 in [place] at [facility] from 12/05/2019 in [place] at [facility] from 10/26/2019 in  [place] at [facility] from 02/01/2019 in [place] at [facility] Total Score 1 0 '4 2 2  '$ PHQ-9 Total Score 5 0 '9 2 13        '$ Review of Systems:  Review of Systems  Gastrointestinal: Negative.   Musculoskeletal:  Negative for gait problem.       Hip pain  Psychiatric/Behavioral:         Please refer to HPI   Medications: I have reviewed the patient's current medications.  Current Outpatient Medications  Medication Sig Dispense Refill   atorvastatin (LIPITOR) 40 MG tablet Take 1 tablet (40 mg total) by mouth daily. 90 tablet 1   Cyanocobalamin (VITAMIN B 12 PO) Take by mouth.     escitalopram (LEXAPRO) 10 MG tablet TAKE ONE TABLET BY MOUTH DAILY 90 tablet 1   ferrous sulfate 325 (65 FE) MG tablet Take 325 mg by mouth daily with breakfast.     fluticasone (FLONASE) 50 MCG/ACT nasal spray Place 2 sprays into both nostrils daily. 18.2 g 2   levocetirizine (XYZAL) 5 MG tablet Take 1 tablet (5 mg total) by mouth every evening. 90 tablet 1   meloxicam (MOBIC) 15 MG tablet Take 15 mg by mouth daily.     Multiple Vitamin (MULTI-VITAMINS) TABS Take by mouth.     Omega-3 Fatty Acids (FISH OIL) 1000 MG CAPS Take by mouth.     Probiotic Product (PROBIOTIC-10 PO) Take 1 tablet by mouth daily.     Pyridoxine HCl (  VITAMIN B-6 PO) Take by mouth daily.     rizatriptan (MAXALT) 10 MG tablet TAKE ONE TABLET BY MOUTH AT ONSET OF HEADACHE; MAY REPEAT ONE TABLET IN 2 HOURS IF NEEDED. 9 tablet 2   TURMERIC PO Take 2 tablets by mouth daily.     Vitamin D, Ergocalciferol, (DRISDOL) 1.25 MG (50000 UNIT) CAPS capsule TAKE 1 CAPSULE (50,000 UNITS TOTAL) BY MOUTH EVERY 7 (SEVEN) DAYS FOR 12 DOSES. 12 capsule 0   azelastine (ASTELIN) 0.1 % nasal spray Can use two sprays in each nostril twice daily as needed for itchy nose and sneezing. (Patient not taking: Reported on 04/15/2021) 30 mL 2   Bepotastine Besilate (BEPREVE) 1.5 % SOLN Can use one drop in each eye twice a day for  itchy eyes. (Patient not taking: Reported on 04/15/2021) 10 mL 5   busPIRone (BUSPAR) 15 MG tablet Take 1 tablet (15 mg total) by mouth 2 (two) times daily. 180 tablet 1   diclofenac (CATAFLAM) 50 MG tablet Take by mouth. (Patient not taking: Reported on 04/15/2021)     LORazepam (ATIVAN) 0.5 MG tablet TAKE ONE TABLET BY MOUTH TWICE A DAY AS NEEDED FOR ANXIETY 60 tablet 3   Olopatadine HCl (PAZEO) 0.7 % SOLN Place 1 drop into both eyes 1 day or 1 dose. (Patient not taking: Reported on 04/15/2021) 2.5 mL 2   venlafaxine XR (EFFEXOR-XR) 150 MG 24 hr capsule Take 2 capsules (300 mg total) by mouth daily. 180 capsule 1   zolmitriptan (ZOMIG) 5 MG tablet Take 1 tablet by mouth for migraine headache.  May repeat in 2 hours if needed.  No more than 2 doses in 24-hour period. (Patient not taking: Reported on 04/15/2021) 5 tablet 0   No current facility-administered medications for this visit.    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   Please see review of systems for further details on the patient's review from today.   Objective:   Physical Exam:  LMP 08/24/2018   Physical Exam Constitutional:      General: Chelsea Dyer is not in acute distress. Musculoskeletal:        General: No deformity.  Neurological:     Mental Status: Chelsea Dyer is alert and oriented to person, place, and time.     Coordination: Coordination normal.  Psychiatric:        Attention and Perception:  Attention and perception normal. Chelsea Dyer does not perceive auditory or visual hallucinations.        Mood and Affect: Mood normal. Mood is not anxious or depressed. Affect is not labile, blunt, angry or inappropriate.        Speech: Speech normal.        Behavior: Behavior normal.        Thought Content: Thought content normal. Thought content is not paranoid or delusional. Thought content does not include homicidal or suicidal ideation. Thought content does not include homicidal or suicidal plan.        Cognition  and Memory: Cognition and memory normal.        Judgment: Judgment normal.     Comments: Insight intact    Lab Review:     Component Value Date/Time   NA 139 01/23/2021 0938   K 4.5 01/23/2021 0938   CL 104 01/23/2021 0938   CO2 24 01/23/2021 0938   GLUCOSE 93 01/23/2021 0938   BUN 16 01/23/2021 0938   CREATININE 0.59 01/23/2021 0938   CALCIUM 9.6 01/23/2021 0938   PROT 7.1 01/23/2021 0938   ALBUMIN 4.5 01/23/2021 0938   AST 17 01/23/2021 0938   ALT 29 01/23/2021 0938   ALKPHOS 91 01/23/2021 0938   BILITOT 0.4 01/23/2021 0938   GFRNONAA >60 02/01/2015 1440   GFRAA >60 02/01/2015 1440       Component Value Date/Time   WBC 5.3 01/23/2021 0938   RBC 4.93 01/23/2021 0938   HGB 14.8 01/23/2021 0938   HCT 43.4 01/23/2021 0938   PLT 286.0 01/23/2021 0938   MCV 88.1 01/23/2021 0938   MCH 29.1 02/01/2015 1440   MCHC 34.2 01/23/2021 0938   RDW 13.7 01/23/2021 0938   LYMPHSABS 1.6 01/23/2021 0938   MONOABS 0.5 01/23/2021 0938   EOSABS 0.4 01/23/2021 0938   BASOSABS 0.0 01/23/2021 0938    No results found for: POCLITH, LITHIUM   No results found for: PHENYTOIN, PHENOBARB, VALPROATE, CBMZ   .res Assessment: Plan:   Chelsea Dyer seen for 25 minutes and time spent reviewing medical and social hx from last visit in July 2021 and reviewing s/s. Chelsea Dyer reports that her mood and anxiety s/s are better controlled at this time and Chelsea Dyer would like to continue current medications. Chelsea Dyer is  uncertain if Chelsea Dyer is currently taking Lexapro. Discussed that her PCP last sent a prescription for Lexapro on 05/15/20 with a 6 month supply and therefore it seems unlikely that Chelsea Dyer would continue to have an adequate supply. Lexapro was d/c'd in the past due to being ineffective. Chelsea Dyer plans to verify whether Chelsea Dyer is taking Lexapro when Chelsea Dyer gets home.  Continue Buspar 15 mg po BID for anxiety.  Continue Lorazepam 0.5 mg po BID prn anxiety.  Continue Effexor XR 300 mg po qd for anxiety and depression.  Recommend continuing therapy with Rinaldo Cloud, LCSW.  Chelsea Dyer to follow-up in 6 months or sooner if clinically indicated.  Patient advised to contact office with any questions, adverse effects, or acute worsening in signs and symptoms.   Chelsea Dyer was seen today for follow-up.  Diagnoses and all orders for this visit:  Anxiety -     busPIRone (BUSPAR) 15 MG tablet; Take 1 tablet (15 mg total) by mouth 2 (two) times daily. -     LORazepam (ATIVAN) 0.5 MG tablet; TAKE ONE TABLET BY MOUTH TWICE A DAY AS NEEDED FOR ANXIETY  Depression, recurrent (HCC) -     venlafaxine XR (EFFEXOR-XR) 150 MG 24 hr capsule; Take 2 capsules (300 mg total) by mouth daily.    Please see After Visit Summary for patient specific instructions.  Future Appointments  Date Time Provider [place]  04/28/2021 10:00 AM Shanon Ace, LCSW [facility] None  05/12/2021 12:00 PM Shanon Ace, LCSW [facility] None  05/26/2021 10:00 AM Shanon Ace, LCSW CP[facility] None  06/10/2021 10:00 AM Shanon Ace, LCSW [facility] None  06/24/2021 10:00 AM Shanon Ace, LCSW [facility] None  07/08/2021 10:00 AM Shanon Ace, LCSW [facility] None  10/16/2021 10:00 AM Thayer Headings, PMHNP CP-CP None    No orders of the defined  types were placed in this encounter.   -------------------------------

## 2021-04-22 ENCOUNTER — Ambulatory Visit (INDEPENDENT_AMBULATORY_CARE_PROVIDER_SITE_OTHER): Payer: BC Managed Care – PPO | Admitting: Psychiatry

## 2021-04-22 ENCOUNTER — Other Ambulatory Visit: Payer: Self-pay

## 2021-04-22 DIAGNOSIS — F419 Anxiety disorder, unspecified: Secondary | ICD-10-CM

## 2021-04-22 NOTE — Progress Notes (Signed)
[facility] Counselor/Therapist Progress Note  Patient ID: Chelsea Dyer, MRN: [id number],    Date: 04/22/2021  Time Spent: 50 minutes   Treatment Type: Individual Therapy  Reported Symptoms: anxiety, depression  Mental Status Exam:  Appearance:   Casual     Behavior:  Appropriate, Sharing, and Motivated  Motor:  Normal  Speech/Language:   Clear and Coherent  Affect:  Anxious, depressed  Mood:  anxious and depressed  Thought process:  goal directed  Thought content:    Some obsessiveness  Sensory/Perceptual disturbances:    WNL  Orientation:  oriented to person, place, time/date, situation, day of week, month of year, year, and stated date of Aug. 10, 2022  Attention:  Fair  Concentration:  Fair  Memory:  [place] of knowledge:   Good  Insight:    Fair  Judgment:   Fair  Impulse Control:  Fair   Risk Assessment: Danger to Self:  No Self-injurious Behavior: No Danger to Others: No Duty to Warn:no Physical Aggression / Violence:No  Access to Firearms a concern: No  Gang Involvement:No   Subjective:   Patient in today reporting anxiety and depression, and also some loneliness.  Having lots of issues at work and patient became very tearful today as soon as she began mentioning her job. Situation at work seems to be spiraling in a negative direction patient finds it hard to not react impulsively which usually does not go well for her.  Processed in session today the increased problems she is having at work that she feels she has no control over "because of what other people do and say".  Patient described several issues that are going on at work which are very frustrating for patient. Patient feeling very angry and hurt about work situation for herself and her daughter with Chelsea Dyer Syndrome who is also employed by same company. Admits she needs more clear boundaries and not give others power to really affect her in negative way. Using some role play we worked on this in  session, using better boundaries and suggested ways of setting better boundaries in a respectful but clear way.  Also demonstrated being able to not respond impulsively to everything that angers her or does not go as planned.  Worked very intentionally on some modulation of emotion skills and of being able to calm herself in the moment and hang on to that regardless of what others may say around her.  Seemed to gain some relief from talking through this and working on some better skills for managing stressful situations at work.  Reminded her again of deep breathing exercises, being able to step back from situations and allow herself time to think before speaking, getting outside daily and having some time to herself, paying attention to her thoughts and changing them at times in order to change her feelings, interrupting negative thoughts and replacing them with thoughts that are more reality based and not as likely to trigger negative emotions.  Interventions: Cognitive Behavioral Therapy, Ego-Supportive, and Insight-Oriented  Diagnosis:   ICD-10-CM   1. Anxiety  F41.9       Treatment goal plan:  Patient not signing Treatment Plan on computer screen due to Covid. Treatment Goals: Goals remain on Treatment Plan as patient works on strategies to achieve her goals.  Progress will be noted each session in "Progress" section of tx plan. Long term goal: Reduce overall level, frequency, and intensity of the anxiety so that daily functioning is not impaired.  Short term goal: Identify, challenge, and replace anxious/depressive/negative thoughts and self talk with more positive, realistic, and empowering thoughts and self talk that do not support anxiety nor depression.  Strategies: 1)Teach patient to implement "thought stopping" techniques regarding anxieties/fears/and worries that may have been addressed but still persist. 2)Review helpful behaviors and strategies and have patient go through the  process of: Review, repeat, and reinforce success     Plan:  Patient today showing good motivation and working on a lot of intense anger and frustration primarily on her job, as noted above.  Worked on being able to hit her "pause" button and difficult times at work as this has been a Writer for her.  Encouraged patient to practice behaviors that have proven to be helpful to her previously including: Interrupting and replacing anxious/depressive/fearful/and negative thoughts with more encouraging and empowering thoughts, practice consistent positive self talk, reflect on positive quotes that she keeps on her phone, stay in the present focused on what she can control, spend time with her dog as a source of emotional comfort, spend some time outside daily and walk, remain in touch with people who are supportive, use clear boundaries with others as needed, look for more positives within herself, use breathing exercises as a helpful tool with anxiety, take breaks throughout the day as she is able, stop thinking worst case scenarios, stop self negating, and feel good about the strength that she is showing in working with goal-directed behaviors in the midst of very challenging circumstances in trying to move forward towards a more positive direction of improved emotional health and stability.   Goal review and progress/challenges noted with patient.  Next appointment within 2 to 3 weeks.   Shanon Ace, LCSW

## 2021-04-23 ENCOUNTER — Other Ambulatory Visit: Payer: Self-pay | Admitting: Internal Medicine

## 2021-04-23 DIAGNOSIS — E559 Vitamin D deficiency, unspecified: Secondary | ICD-10-CM

## 2021-04-23 DIAGNOSIS — F339 Major depressive disorder, recurrent, unspecified: Secondary | ICD-10-CM

## 2021-04-28 ENCOUNTER — Ambulatory Visit (INDEPENDENT_AMBULATORY_CARE_PROVIDER_SITE_OTHER): Payer: BC Managed Care – PPO | Admitting: Psychiatry

## 2021-04-28 ENCOUNTER — Other Ambulatory Visit: Payer: Self-pay

## 2021-04-28 DIAGNOSIS — F419 Anxiety disorder, unspecified: Secondary | ICD-10-CM

## 2021-04-28 NOTE — Progress Notes (Signed)
[facility] Counselor/Therapist Progress Note  Patient ID: Chelsea Dyer, MRN: [id number],    Date: 04/28/2021  Time Spent: 58 minutes  Treatment Type: Individual Therapy  Reported Symptoms: anxiety, depression "improved some"  Mental Status Exam:  Appearance:   Neat     Behavior:  Appropriate, Sharing, and Motivated  Motor:  Normal  Speech/Language:   Clear and Coherent and Normal Rate  Affect:  Anxious, some depression  Mood:  anxious and depressed  Thought process:  goal directed  Thought content:    Some obsessiveness  Sensory/Perceptual disturbances:    WNL  Orientation:  oriented to person, place, time/date, situation, day of week, month of year, year, and stated date of Aug. 16, 2022  Attention:  Good  Concentration:  Good  Memory:  WNL  Fund of knowledge:   Good  Insight:    Good and Fair  Judgment:   Good and Fair  Impulse Control:  Good and Fair   Risk Assessment: Danger to Self:  No Self-injurious Behavior: No Danger to Others: No Duty to Warn:no Physical Aggression / Violence:No  Access to Firearms a concern: No  Gang Involvement:No   Subjective:   Patient in today reporting anxiety, depression, "but depression is some better".  States prior "loneliness has decreased."  Very anxious in session initially and took her a while to calm.  Work continues to be a significant stressor and patient processing today her feelings of anger, disappointment, distrust, stressed out, lack of care about employees. Is trying to explore other work options and had phone pre-interview yesterday with another company. Things lately have "been spiraling more in negative ways" and she's been trying hard not to react impulsively. Anxious thoughts "have been worse and fearful of the things that could happen in the world politically and in other ways."  Encouraged her to decrease time spent watching or listening to distressing news on TV or online as this accentuates her anxiety and  fears.  Discussed how she typically manages her fears and anxieties, and reviewed deep breathing exercises that have helped her before per her report, as well as being able to refocus at times when she gets absorbed by things that are anxiety/fear producing, and limiting exposure as noted above to things that are known contributors to her anxiety and stress.  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  States "trying to put things out of my mind and do things like cleaning my house, watching funny videos, and being outside."  Shares that  those behaviors also help when she is feeling impulsive. Also shared "I need to change myself physically and be healthier", sharing she plans to try the [place] program changing her diet and lose some weight.   Interventions: Cognitive Behavioral Therapy and Ego-Supportive  Diagnosis:   ICD-10-CM   1. Anxiety  F41.9       Treatment goal plan:  Patient not signing Treatment Plan on computer screen due to Covid. Treatment Goals: Goals remain on Treatment Plan as patient works on strategies to achieve her goals.  Progress will be noted each session in "Progress" section of tx plan. Long term goal: Reduce overall level, frequency, and intensity of the anxiety so that daily functioning is not impaired.  Short term goal: Identify, challenge, and replace anxious/depressive/negative thoughts and self talk with more positive, realistic,  and empowering thoughts and self talk that do not support anxiety nor depression.  Strategies: 1)Teach patient to implement "thought stopping" techniques regarding anxieties/fears/and worries that may have been addressed but still persist. 2)Review helpful behaviors and strategies and have patient go through the process of: Review, repeat, and reinforce success    Plan:   Patient today showing some motivation and it improved over the session.  Had more intense anger and frustration today both personally and in her work environment.  Reminded her again about hitting her "pause" button which she has been successful with previously during difficult times at work, but this is a real challenge for her.  Encouraged patient to also practice behaviors that have been helpful to her in the past including: Getting outside daily and having some time to herself, walking, paying more attention to her thoughts and being able to change them at times in order to change her feelings as discussed in session, interrupting negative thoughts and replacing them with thoughts that are more reality based and not as likely to trigger negative emotions, practice consistent positive self talk, reflect on positive quotes that she has on her phone, stay in the present focusing on what she can control, recognizing her excessive worrying and interrupting it understanding that keeps her from feeling peace in the moment, spend time with her dog as a source of emotional comfort, remain in touch with people who are supportive, use clear boundaries with others as needed, look for positives within herself, take breaks throughout the day as she is able, stop self negating, stop thinking worst case scenarios, use breathing exercises as a helpful tool with her anxiety, and recognize the strength she is showing in working with goal directed behaviors in challenging circumstances as she tries to move forward towards a more positive direction of improved emotional health and stability.  Goal review and progress/challenges noted with patient.  Next appointment within 1-2 weeks.   Shanon Ace, LCSW

## 2021-04-30 ENCOUNTER — Ambulatory Visit
Admission: RE | Admit: 2021-04-30 | Discharge: 2021-04-30 | Disposition: A | Discharge status: Home / Self Care | Payer: BC Managed Care – PPO | Source: Ambulatory Visit | Attending: Internal Medicine | Admitting: Internal Medicine

## 2021-04-30 DIAGNOSIS — Z1231 Encounter for screening mammogram for malignant neoplasm of breast: Secondary | ICD-10-CM

## 2021-05-12 ENCOUNTER — Ambulatory Visit: Payer: BC Managed Care – PPO | Admitting: Psychiatry

## 2021-05-26 ENCOUNTER — Ambulatory Visit: Payer: BC Managed Care – PPO | Admitting: Psychiatry

## 2021-05-26 ENCOUNTER — Ambulatory Visit (INDEPENDENT_AMBULATORY_CARE_PROVIDER_SITE_OTHER): Payer: BC Managed Care – PPO | Admitting: *Deleted

## 2021-05-26 DIAGNOSIS — J309 Allergic rhinitis, unspecified: Secondary | ICD-10-CM | POA: Diagnosis not present

## 2021-06-05 ENCOUNTER — Other Ambulatory Visit: Payer: Self-pay | Admitting: *Deleted

## 2021-06-05 ENCOUNTER — Other Ambulatory Visit: Payer: Self-pay

## 2021-06-05 DIAGNOSIS — Z8669 Personal history of other diseases of the nervous system and sense organs: Secondary | ICD-10-CM

## 2021-06-05 DIAGNOSIS — F419 Anxiety disorder, unspecified: Secondary | ICD-10-CM

## 2021-06-05 DIAGNOSIS — F339 Major depressive disorder, recurrent, unspecified: Secondary | ICD-10-CM

## 2021-06-05 DIAGNOSIS — E559 Vitamin D deficiency, unspecified: Secondary | ICD-10-CM

## 2021-06-05 MED ORDER — VENLAFAXINE HCL ER 150 MG PO CP24: 300.0000 mg | ORAL_CAPSULE | Freq: Every day | ORAL | 1 refills | Status: DC

## 2021-06-05 MED ORDER — RIZATRIPTAN BENZOATE 10 MG PO TABS: ORAL_TABLET | ORAL | 2 refills | Status: DC

## 2021-06-05 MED ORDER — ESCITALOPRAM OXALATE 10 MG PO TABS: 10.0000 mg | ORAL_TABLET | Freq: Every day | ORAL | 1 refills | Status: DC

## 2021-06-05 MED ORDER — BUSPIRONE HCL 15 MG PO TABS: 15.0000 mg | ORAL_TABLET | Freq: Two times a day (BID) | ORAL | 1 refills | Status: DC

## 2021-06-07 ENCOUNTER — Other Ambulatory Visit: Payer: Self-pay | Admitting: Internal Medicine

## 2021-06-07 DIAGNOSIS — Z8669 Personal history of other diseases of the nervous system and sense organs: Secondary | ICD-10-CM

## 2021-06-08 ENCOUNTER — Encounter: Payer: Self-pay | Admitting: Internal Medicine

## 2021-06-10 ENCOUNTER — Ambulatory Visit: Payer: BC Managed Care – PPO | Admitting: Psychiatry

## 2021-06-12 DIAGNOSIS — M545 Low back pain, unspecified: Secondary | ICD-10-CM | POA: Diagnosis not present

## 2021-06-12 DIAGNOSIS — M25552 Pain in left hip: Secondary | ICD-10-CM | POA: Diagnosis not present

## 2021-06-12 DIAGNOSIS — M9901 Segmental and somatic dysfunction of cervical region: Secondary | ICD-10-CM | POA: Diagnosis not present

## 2021-06-12 DIAGNOSIS — M25551 Pain in right hip: Secondary | ICD-10-CM | POA: Diagnosis not present

## 2021-06-18 ENCOUNTER — Telehealth: Payer: Self-pay | Admitting: *Deleted

## 2021-06-18 NOTE — Telephone Encounter (Signed)
[facility] fax:  Patient is either taking both medications, or going back and forth between the two.  Escitalopram 10 mg and/or Venlafxine Er 15 mg.  Should patient take both or discontinue one?

## 2021-06-18 NOTE — Telephone Encounter (Signed)
Form faxed to [facility]

## 2021-06-22 ENCOUNTER — Telehealth: Payer: Self-pay

## 2021-06-22 DIAGNOSIS — F339 Major depressive disorder, recurrent, unspecified: Secondary | ICD-10-CM

## 2021-06-22 DIAGNOSIS — E559 Vitamin D deficiency, unspecified: Secondary | ICD-10-CM

## 2021-06-22 DIAGNOSIS — F419 Anxiety disorder, unspecified: Secondary | ICD-10-CM

## 2021-06-22 NOTE — Telephone Encounter (Signed)
Patient called requesting Rx refill appt and their are no appts available before 10/18 when pt insurance expires and pt would like to be worked in so pt can get her Rx. If possible pt would like a call back

## 2021-06-23 DIAGNOSIS — J3089 Other allergic rhinitis: Secondary | ICD-10-CM

## 2021-06-23 NOTE — Telephone Encounter (Signed)
Left a message for the pt to return my call.  

## 2021-06-23 NOTE — Telephone Encounter (Signed)
Pt informed to contact psychiatry in regards to refill her medication as rx was prescribed by them.

## 2021-06-23 NOTE — Progress Notes (Signed)
VIALS MADE. EXP 06-23-22

## 2021-06-24 ENCOUNTER — Other Ambulatory Visit: Payer: Self-pay

## 2021-06-24 ENCOUNTER — Telehealth: Payer: Self-pay | Admitting: Psychiatry

## 2021-06-24 ENCOUNTER — Ambulatory Visit (INDEPENDENT_AMBULATORY_CARE_PROVIDER_SITE_OTHER): Payer: BC Managed Care – PPO | Admitting: Psychiatry

## 2021-06-24 DIAGNOSIS — F419 Anxiety disorder, unspecified: Secondary | ICD-10-CM

## 2021-06-24 MED ORDER — VITAMIN B 12 500 MCG PO TABS: 500.0000 ug | ORAL_TABLET | Freq: Every day | ORAL | 1 refills | Status: DC

## 2021-06-24 NOTE — Telephone Encounter (Signed)
Next visit is 10/16/21. Chelsea Dyer forgot to ask when she was just seen if she can get a refill on her Vitamin B12 for 90 days called in to:  [id number] [place], [place] - [address]  Phone:  [phone]  Fax:  [phone]   She is starting a new job and will get her new insurance in 90 days.

## 2021-06-24 NOTE — Telephone Encounter (Signed)
PT informed

## 2021-06-24 NOTE — Telephone Encounter (Signed)
Please let her know it was sent.

## 2021-06-24 NOTE — Progress Notes (Signed)
[facility] Counselor/Therapist Progress Note  Patient ID: Chelsea Dyer, MRN: [id number],    Date: 06/24/2021  Time Spent: 50 minutes   Treatment Type: Individual Therapy  Reported Symptoms: anxiety, does get some depressed watching world news on TV  Mental Status Exam:  Appearance:   Neat     Behavior:  Appropriate, Sharing, and Motivated  Motor:  Normal  Speech/Language:   Clear and Coherent  Affect:  anxious  Mood:  anxious  Thought process:  goal directed  Thought content:    Obsessions  Sensory/Perceptual disturbances:    WNL  Orientation:  oriented to person, place, time/date, situation, day of week, month of year, year, and stated date of Oct. 12, 2022  Attention:  Fair  Concentration:  Good and Fair  Memory:  [place] of knowledge:   Good  Insight:    Good and Fair  Judgment:   Good and Fair  Impulse Control:  Good and Fair   Risk Assessment: Danger to Self:  No Self-injurious Behavior: No Danger to Others: No Duty to Warn:no Physical Aggression / Violence:No  Access to Firearms a concern: No  Gang Involvement:No   Subjective: Patient in today reporting anxiety is "my main symptom". States anxiety is mostly related to  her work, "life", and all t"he bad things going on in the world." Feels good about losing apprx 15 lbs.  Disappointed in not getting a job she had hoped for. Reports prior depression and loneliness have decreased. Shared work stressors that have continued. Had several job interviews most recently and has been offered a new job with [employer] and to start that job on Oct . 23rd. is feeling stressed because it would be "something new" but does feel like that will be a good fit for her, and will be a welcome change for patient as she has felt so much stress and conflicted feelings in her current job.  Reports that the negative spiraling that she was feeling at last appointment has decreased significantly.  Is bothered by "all the crazy things  that go on in the world" and I have encouraged her again to decrease her watching world news on TV or online.  Is working harder to step back from situations, to give herself some time to really think through things, before reacting or speaking impulsively as she has had several occasions where that did not go well, especially on the job.  Worked on this today, using several examples from her current job.  Continues the [place] program to help her lose weight and reports as noted above the 15 pound weight loss, which she also reports is helping her feel better about herself overall.  Does anticipate more anxiety as she gets closer to starting a new job and we reviewed ways of taking care of herself and trying to keep her anxiety from escalating during this time of waiting and anticipating going into a new work situation.  Interventions: Solution-Oriented/Positive Psychology, Ego-Supportive, and Insight-Oriented  Diagnosis:   ICD-10-CM   1. Anxiety  F41.9      Treatment goal plan:  Patient not signing Treatment Plan on computer screen due to Covid. Treatment Goals: Goals remain on Treatment Plan as patient works on strategies to achieve her goals.  Progress will be noted each session in "Progress" section of tx plan. Long term goal: Reduce overall level, frequency, and intensity of the anxiety so that daily functioning is not impaired.  Short term goal: Identify, challenge, and  replace anxious/depressive/negative thoughts and self talk with more positive, realistic, and empowering thoughts and self talk that do not support anxiety nor depression.  Strategies: 1)Teach patient to implement "thought stopping" techniques regarding anxieties/fears/and worries that may have been addressed but still persist. 2)Review helpful behaviors and strategies and have patient go through the process of: Review, repeat, and reinforce success     Plan: Patient today showing increased motivation and participation  in session.  Talked a lot about issues with work and how she has interviewed and got accepted for a new job and she is eagerly waiting to start it.  Less anger and less frustration recently.  Has not been "assuming the worst" quite as much but feels that this new job is helping change her perspective some.  Worked some in session on modulating her emotions which was helpful to patient.  Encouraged her to be practicing positive behaviors including: Getting outside daily and having some time to herself, walking, paying more attention to her thoughts and being able to change them at times in order to change her feelings as discussed in session, interrupting negative thoughts and replacing them with thoughts that are more reality based and not as likely to trigger negative emotions, practice consistent positive self talk, reflect on positive quotes that she has on her phone, stay in the present focusing on what she can control, recognizing excessive worrying and interrupting it understanding that keeps her from feeling peace in the moment, spending time with her dog as a source of emotional support, remain in contact with people who are supportive, use clear boundaries with others as needed, look for positives within herself, take breaks throughout the day as she is able, stop self negating, stop thinking worst case scenarios, use breathing exercises as a helpful tool with her anxiety, and recognize the strength she shows in working with goal-directed behaviors to move forward in a direction that it supports overall stability and improved emotional health.  Goal review and progress/challenges noted with patient.  Next appointment within 3 weeks.  This record has been created using [employer].  Chart creation errors have been sought, but may not always have been located and corrected.  Such creation errors do not reflect on the standard of medical care provided.    Shanon Ace,  LCSW

## 2021-06-24 NOTE — Telephone Encounter (Signed)
Can you prescribe this for her?

## 2021-06-26 ENCOUNTER — Other Ambulatory Visit: Payer: Self-pay | Admitting: Internal Medicine

## 2021-06-26 DIAGNOSIS — E559 Vitamin D deficiency, unspecified: Secondary | ICD-10-CM

## 2021-06-29 DIAGNOSIS — M9901 Segmental and somatic dysfunction of cervical region: Secondary | ICD-10-CM | POA: Diagnosis not present

## 2021-06-29 DIAGNOSIS — M25551 Pain in right hip: Secondary | ICD-10-CM | POA: Diagnosis not present

## 2021-06-29 DIAGNOSIS — M545 Low back pain, unspecified: Secondary | ICD-10-CM | POA: Diagnosis not present

## 2021-06-29 DIAGNOSIS — M25552 Pain in left hip: Secondary | ICD-10-CM | POA: Diagnosis not present

## 2021-07-08 ENCOUNTER — Ambulatory Visit: Payer: BC Managed Care – PPO | Admitting: Internal Medicine

## 2021-07-08 ENCOUNTER — Other Ambulatory Visit: Payer: Self-pay

## 2021-07-08 ENCOUNTER — Ambulatory Visit: Payer: BC Managed Care – PPO | Admitting: Psychiatry

## 2021-07-08 ENCOUNTER — Encounter: Payer: Self-pay | Admitting: Internal Medicine

## 2021-07-08 VITALS — BP 112/70 | HR 74 | Temp 98.6°F | Ht 62.0 in | Wt 162.5 lb

## 2021-07-08 DIAGNOSIS — E782 Mixed hyperlipidemia: Secondary | ICD-10-CM

## 2021-07-08 DIAGNOSIS — Z8669 Personal history of other diseases of the nervous system and sense organs: Secondary | ICD-10-CM | POA: Diagnosis not present

## 2021-07-08 DIAGNOSIS — E559 Vitamin D deficiency, unspecified: Secondary | ICD-10-CM

## 2021-07-08 DIAGNOSIS — F339 Major depressive disorder, recurrent, unspecified: Secondary | ICD-10-CM

## 2021-07-08 LAB — LIPID PANEL
Cholesterol: 184 mg/dL (ref 0–200)
HDL: 53.1 mg/dL (ref >39.00)
LDL Cholesterol: 110 mg/dL — ABNORMAL HIGH (ref 0–99)
NonHDL: 130.45
Total CHOL/HDL Ratio: 3
Triglycerides: 101 mg/dL (ref 0.0–149.0)
VLDL: 20.2 mg/dL (ref 0.0–40.0)

## 2021-07-08 LAB — VITAMIN D 25 HYDROXY (VIT D DEFICIENCY, FRACTURES): VITD: 69.74 ng/mL (ref 30.00–100.00)

## 2021-07-08 MED ORDER — VENLAFAXINE HCL ER 150 MG PO CP24: 300.0000 mg | ORAL_CAPSULE | Freq: Every day | ORAL | 1 refills | Status: DC

## 2021-07-08 MED ORDER — ZOLMITRIPTAN 5 MG PO TABS: ORAL_TABLET | ORAL | 0 refills | Status: DC

## 2021-07-08 NOTE — Progress Notes (Signed)
Established Patient Office Visit     This visit occurred during the SARS-CoV-2 public health emergency.  Safety protocols were in place, including screening questions prior to the visit, additional usage of staff PPE, and extensive cleaning of exam room while observing appropriate contact time as indicated for disinfecting solutions.    CC/Reason for Visit: Follow-up chronic conditions, medication refills  HPI: Chelsea Dyer is a 55 y.o. female who is coming in today for the above mentioned reasons. Past Medical History is significant for: Depression, migraine headaches, vitamin D deficiency and hyperlipidemia.  She has lost 20 pounds since her last visit.  She decided to quit taking her cholesterol medication because of this.  She has not yet completed vitamin D that was prescribed for 12 weeks back in May.  She has returned to see her psychiatrist and psychotherapist, her mood is significantly improved.  She declines flu vaccination today.   Past Medical/Surgical History: Past Medical History:  Diagnosis Date   ALLERGIC RHINITIS 03/04/2008   Anxiety    DEPRESSION 03/04/2008   Edema    pedal   EXOGENOUS OBESITY 06/22/2010   PITUITARY MICROADENOMA 10/07/2009   Prolactinoma [facility])     Past Surgical History:  Procedure Laterality Date   BREAST SURGERY  2004   augmentation   HAMMER TOE SURGERY     LIPOSUCTION     NASAL SINUS SURGERY      Social History:  reports that she has never smoked. She has never used smokeless tobacco. She reports that she does not drink alcohol and does not use drugs.  Allergies: Allergies  Allergen Reactions   Percocet [Oxycodone-Acetaminophen] Other (See Comments)    Interfered with patient's antidepressants; made pt feel dizzy   Septra [Sulfamethoxazole-Trimethoprim] Rash    Family History:  Family History  Problem Relation Age of Onset   Arthritis Mother        osteo   Allergic rhinitis Mother    Depression Mother    Down syndrome  Daughter    Breast cancer Neg Hx      Current Outpatient Medications:    azelastine (ASTELIN) 0.1 % nasal spray, Can use two sprays in each nostril twice daily as needed for itchy nose and sneezing., Disp: 30 mL, Rfl: 2   Bepotastine Besilate (BEPREVE) 1.5 % SOLN, Can use one drop in each eye twice a day for itchy eyes., Disp: 10 mL, Rfl: 5   busPIRone (BUSPAR) 15 MG tablet, Take 1 tablet (15 mg total) by mouth 2 (two) times daily., Disp: 180 tablet, Rfl: 1   Cyanocobalamin (VITAMIN B 12) 500 MCG TABS, Take 500 mcg by mouth daily., Disp: 90 tablet, Rfl: 1   diclofenac (CATAFLAM) 50 MG tablet, Take by mouth., Disp: , Rfl:    escitalopram (LEXAPRO) 10 MG tablet, Take 1 tablet (10 mg total) by mouth daily., Disp: 90 tablet, Rfl: 1   ferrous sulfate 325 (65 FE) MG tablet, Take 325 mg by mouth daily with breakfast., Disp: , Rfl:    fluticasone (FLONASE) 50 MCG/ACT nasal spray, Place 2 sprays into both nostrils daily., Disp: 18.2 g, Rfl: 2   levocetirizine (XYZAL) 5 MG tablet, Take 1 tablet (5 mg total) by mouth every evening., Disp: 90 tablet, Rfl: 1   LORazepam (ATIVAN) 0.5 MG tablet, TAKE ONE TABLET BY MOUTH TWICE A DAY AS NEEDED FOR ANXIETY, Disp: 60 tablet, Rfl: 3   meloxicam (MOBIC) 15 MG tablet, Take 15 mg by mouth daily., Disp: , Rfl:  Multiple Vitamin (MULTI-VITAMINS) TABS, Take by mouth., Disp: , Rfl:    Olopatadine HCl (PAZEO) 0.7 % SOLN, Place 1 drop into both eyes 1 day or 1 dose., Disp: 2.5 mL, Rfl: 2   Omega-3 Fatty Acids (FISH OIL) 1000 MG CAPS, Take by mouth., Disp: , Rfl:    Probiotic Product (PROBIOTIC-10 PO), Take 1 tablet by mouth daily., Disp: , Rfl:    Pyridoxine HCl (VITAMIN B-6 PO), Take by mouth daily., Disp: , Rfl:    rizatriptan (MAXALT) 10 MG tablet, TAKE ONE TABLET BY MOUTH AT ONSET OF HEADACHE; MAY REPEAT ONE TABLET IN 2 HOURS IF NEEDED., Disp: 9 tablet, Rfl: 2   TURMERIC PO, Take 2 tablets by mouth daily., Disp: , Rfl:    venlafaxine XR (EFFEXOR-XR) 150 MG 24 hr  capsule, Take 2 capsules (300 mg total) by mouth daily., Disp: 180 capsule, Rfl: 1   zolmitriptan (ZOMIG) 5 MG tablet, Take 1 tablet by mouth for migraine headache.  May repeat in 2 hours if needed.  No more than 2 doses in 24-hour period., Disp: 5 tablet, Rfl: 0  Review of Systems:  Constitutional: Denies fever, chills, diaphoresis, appetite change and fatigue.  HEENT: Denies photophobia, eye pain, redness, hearing loss, ear pain, congestion, sore throat, rhinorrhea, sneezing, mouth sores, trouble swallowing, neck pain, neck stiffness and tinnitus.   Respiratory: Denies SOB, DOE, cough, chest tightness,  and wheezing.   Cardiovascular: Denies chest pain, palpitations and leg swelling.  Gastrointestinal: Denies nausea, vomiting, abdominal pain, diarrhea, constipation, blood in stool and abdominal distention.  Genitourinary: Denies dysuria, urgency, frequency, hematuria, flank pain and difficulty urinating.  Endocrine: Denies: hot or cold intolerance, sweats, changes in hair or nails, polyuria, polydipsia. Musculoskeletal: Denies myalgias, back pain, joint swelling, arthralgias and gait problem.  Skin: Denies pallor, rash and wound.  Neurological: Denies dizziness, seizures, syncope, weakness, light-headedness, numbness and headaches.  Hematological: Denies adenopathy. Easy bruising, personal or family bleeding history  Psychiatric/Behavioral: Denies suicidal ideation, mood changes, confusion, nervousness, sleep disturbance and agitation    Physical Exam: Vitals:   07/08/21 1132  BP: 112/70  Pulse: 74  Temp: 98.6 F (37 C)  TempSrc: Oral  SpO2: 99%  Weight: 162 lb 8 oz (73.7 kg)  Height: 5\' 2"  (1.575 m)    Body mass index is 29.72 kg/m.   Constitutional: NAD, calm, comfortable Eyes: PERRL, lids and conjunctivae normal, wears corrective lenses ENMT: Mucous membranes are moist.  Respiratory: clear to auscultation bilaterally, no wheezing, no crackles. Normal respiratory effort. No  accessory muscle use.  Cardiovascular: Regular rate and rhythm, no murmurs / rubs / gallops. No extremity edema.  Neurologic: Grossly intact and nonfocal Psychiatric: Normal judgment and insight. Alert and oriented x 3. Normal mood.    Impression and Plan:  Mixed hyperlipidemia  - Plan: Lipid panel -We will make a decision on whether she should resume atorvastatin pending results.  Vitamin D deficiency  - Plan: VITAMIN D 25 Hydroxy (Vit-D Deficiency, Fractures)  Depression, recurrent (HCC)  - Plan: venlafaxine XR (EFFEXOR-XR) 150 MG 24 hr capsule -She is also on a multitude of other medications including BuSpar, lorazepam, Lexapro prescribed by psychiatry.  History of migraine headaches  - Plan: zolmitriptan (ZOMIG) 5 MG tablet  Time spent: 33 minutes reviewing chart, interviewing and examining patient and formulating plan of care.   Patient Instructions  -Nice seeing you today!!  -See you back in 6 months for your physical. Please come in fasting that day.    Lelon Frohlich,  MD [facility]

## 2021-07-08 NOTE — Patient Instructions (Signed)
-  Nice seeing you today!!  -See you back in 6 months for your physical. Please come in fasting that day.

## 2021-07-09 ENCOUNTER — Encounter: Payer: BC Managed Care – PPO | Admitting: Internal Medicine

## 2021-07-10 ENCOUNTER — Ambulatory Visit (INDEPENDENT_AMBULATORY_CARE_PROVIDER_SITE_OTHER): Payer: BC Managed Care – PPO

## 2021-07-10 DIAGNOSIS — J309 Allergic rhinitis, unspecified: Secondary | ICD-10-CM

## 2021-07-19 ENCOUNTER — Other Ambulatory Visit: Payer: Self-pay | Admitting: Internal Medicine

## 2021-07-19 DIAGNOSIS — E785 Hyperlipidemia, unspecified: Secondary | ICD-10-CM

## 2021-07-22 ENCOUNTER — Ambulatory Visit: Payer: BC Managed Care – PPO | Admitting: Psychiatry

## 2021-07-24 DIAGNOSIS — M79652 Pain in left thigh: Secondary | ICD-10-CM | POA: Diagnosis not present

## 2021-07-24 DIAGNOSIS — M9903 Segmental and somatic dysfunction of lumbar region: Secondary | ICD-10-CM | POA: Diagnosis not present

## 2021-07-24 DIAGNOSIS — M545 Low back pain, unspecified: Secondary | ICD-10-CM | POA: Diagnosis not present

## 2021-07-24 DIAGNOSIS — M25552 Pain in left hip: Secondary | ICD-10-CM | POA: Diagnosis not present

## 2021-07-28 ENCOUNTER — Telehealth: Payer: Self-pay | Admitting: Internal Medicine

## 2021-07-28 DIAGNOSIS — Z8669 Personal history of other diseases of the nervous system and sense organs: Secondary | ICD-10-CM

## 2021-07-28 NOTE — Telephone Encounter (Signed)
Mikal phar tech with [facility] is calling and would like a new rx for   zolmitriptan (ZOMIG) 5 MG tablet  . Pt would like #90 w/refills   [place], [place] Phone:  [phone]  Fax:  [phone]

## 2021-07-28 NOTE — Telephone Encounter (Signed)
Okay to refill for #90?

## 2021-07-29 MED ORDER — ZOLMITRIPTAN 5 MG PO TABS: ORAL_TABLET | ORAL | 0 refills | Status: DC

## 2021-07-29 NOTE — Telephone Encounter (Signed)
Refill sent.

## 2021-07-30 ENCOUNTER — Ambulatory Visit: Payer: BC Managed Care – PPO | Admitting: Psychiatry

## 2021-08-08 ENCOUNTER — Other Ambulatory Visit: Payer: Self-pay | Admitting: Internal Medicine

## 2021-08-08 DIAGNOSIS — Z8669 Personal history of other diseases of the nervous system and sense organs: Secondary | ICD-10-CM

## 2021-08-16 DIAGNOSIS — U071 COVID-19: Secondary | ICD-10-CM | POA: Diagnosis not present

## 2021-08-21 ENCOUNTER — Ambulatory Visit: Payer: BC Managed Care – PPO | Admitting: Family Medicine

## 2021-08-21 ENCOUNTER — Other Ambulatory Visit: Payer: Self-pay | Admitting: Internal Medicine

## 2021-08-21 DIAGNOSIS — Z8669 Personal history of other diseases of the nervous system and sense organs: Secondary | ICD-10-CM

## 2021-08-26 ENCOUNTER — Ambulatory Visit (INDEPENDENT_AMBULATORY_CARE_PROVIDER_SITE_OTHER): Payer: BC Managed Care – PPO

## 2021-08-26 DIAGNOSIS — J309 Allergic rhinitis, unspecified: Secondary | ICD-10-CM

## 2021-09-03 NOTE — Progress Notes (Deleted)
FOLLOW UP Date of Service/Encounter:  09/03/21   Subjective:  Chelsea Dyer (DOB: 01/15/1966) is a 55 y.o. female who returns to the [facility] on 09/09/2021 in re-evaluation of the following: Perennial and seasonal allergic rhinitis on allergy shots since 2017 History obtained from: chart review and {Persons; PED relatives w/patient:19415::"patient"}.  For Review, LV was on 04/15/2020 with Dr. Ernst Bowler seen for seasonal and perennial allergic rhinitis (trees, dust mites) on allergen immunotherapy since March 2017 on maintenance since March 2018 (continued on allergy shots, cetirizine, Flonase, Pataday as needed) and acute sinusitis (started on doxycycline for 10 days).    Today she presents for follow-up.   Allergies as of 09/09/2021       Reactions   Percocet [oxycodone-acetaminophen] Other (See Comments)   Interfered with patient's antidepressants; made pt feel dizzy   Septra [sulfamethoxazole-trimethoprim] Rash        Medication List        Accurate as of September 03, 2021  3:57 PM. If you have any questions, ask your nurse or doctor.          azelastine 0.1 % nasal spray Commonly known as: ASTELIN Can use two sprays in each nostril twice daily as needed for itchy nose and sneezing.   Bepotastine Besilate 1.5 % Soln Commonly known as: Bepreve Can use one drop in each eye twice a day for itchy eyes.   busPIRone 15 MG tablet Commonly known as: BUSPAR Take 1 tablet (15 mg total) by mouth 2 (two) times daily.   diclofenac 50 MG tablet Commonly known as: CATAFLAM Take by mouth.   escitalopram 10 MG tablet Commonly known as: LEXAPRO Take 1 tablet (10 mg total) by mouth daily.   ferrous sulfate 325 (65 FE) MG tablet Take 325 mg by mouth daily with breakfast.   Fish Oil 1000 MG Caps Take by mouth.   fluticasone 50 MCG/ACT nasal spray Commonly known as: FLONASE Place 2 sprays into both nostrils daily.   levocetirizine 5 MG tablet Commonly  known as: XYZAL Take 1 tablet (5 mg total) by mouth every evening.   LORazepam 0.5 MG tablet Commonly known as: ATIVAN TAKE ONE TABLET BY MOUTH TWICE A DAY AS NEEDED FOR ANXIETY   meloxicam 15 MG tablet Commonly known as: MOBIC Take 15 mg by mouth daily.   Multi-Vitamins Tabs Take by mouth.   Pazeo 0.7 % Soln Generic drug: Olopatadine HCl Place 1 drop into both eyes 1 day or 1 dose.   PROBIOTIC-10 PO Take 1 tablet by mouth daily.   rizatriptan 10 MG tablet Commonly known as: MAXALT TAKE ONE TABLET BY MOUTH AT ONSET OF HEADACHE MAY REPEAT ONE TABLET IN 2 HOURS IF NEEDED.   TURMERIC PO Take 2 tablets by mouth daily.   venlafaxine XR 150 MG 24 hr capsule Commonly known as: EFFEXOR-XR Take 2 capsules (300 mg total) by mouth daily.   Vitamin B 12 500 MCG Tabs Take 500 mcg by mouth daily.   VITAMIN B-6 PO Take by mouth daily.   zolmitriptan 5 MG tablet Commonly known as: ZOMIG Take 1 tablet by mouth for migraine headache.  May repeat in 2 hours if needed.  No more than 2 doses in 24-hour period.       Past Medical History:  Diagnosis Date   ALLERGIC RHINITIS 03/04/2008   Anxiety    DEPRESSION 03/04/2008   Edema    pedal   EXOGENOUS OBESITY 06/22/2010   PITUITARY MICROADENOMA 10/07/2009   Prolactinoma (  [facility])    Past Surgical History:  Procedure Laterality Date   BREAST SURGERY  2004   augmentation   HAMMER TOE SURGERY     LIPOSUCTION     NASAL SINUS SURGERY     Otherwise, there have been no changes to her past medical history, surgical history, family history, or social history.  ROS: All others negative except as noted per HPI.   Objective:  LMP 08/24/2018  There is no height or weight on file to calculate BMI. Physical Exam: General Appearance:  Alert, cooperative, no distress, appears stated age  Head:  Normocephalic, without obvious abnormality, atraumatic  Eyes:  Conjunctiva clear, EOM's intact  Nose: Nares normal, {Blank  multiple:19196:a:"***","hypertrophic turbinates","normal mucosa","no visible anterior polyps","septum midline"}  Throat: Lips, tongue normal; teeth and gums normal, {Blank multiple:19196:a:"***","normal posterior oropharynx","tonsils 2+","tonsils 3+","no tonsillar exudate","+ cobblestoning"}  Neck: Supple, symmetrical  Lungs:   {Blank multiple:19196:a:"***","clear to auscultation bilaterally","end-expiratory wheezing","wheezing throughout"}, Respirations unlabored, {Blank multiple:19196:a:"***","no coughing","intermittent dry coughing"}  Heart:  {Blank multiple:19196:a:"***","regular rate and rhythm","no murmur"}, Appears well perfused  Extremities: No edema  Skin: Skin color, texture, turgor normal, no rashes or lesions on visualized portions of skin  Neurologic: No gross deficits   Reviewed: ***  Spirometry:  Tracings reviewed. Her effort: {Blank single:19197::"Good reproducible efforts.","It was hard to get consistent efforts and there is a question as to whether this reflects a maximal maneuver.","Poor effort, data can not be interpreted.","Variable effort-results affected.","decent for first attempt at spirometry."} FVC: ***L FEV1: ***L, ***% predicted FEV1/FVC ratio: ***% Interpretation: {Blank single:19197::"Spirometry consistent with mild obstructive disease","Spirometry consistent with moderate obstructive disease","Spirometry consistent with severe obstructive disease","Spirometry consistent with possible restrictive disease","Spirometry consistent with mixed obstructive and restrictive disease","Spirometry uninterpretable due to technique","Spirometry consistent with normal pattern","No overt abnormalities noted given today's efforts"}.  Please see scanned spirometry results for details.  Skin Testing: {Blank single:19197::"Select foods","Environmental allergy panel","Environmental allergy panel and select foods","Food allergy panel","None","Deferred due to recent antihistamines  use"}. Positive test to: ***. Negative test to: ***.  Results discussed with patient/family.   {Blank single:19197::"Allergy testing results were read and interpreted by myself, documented by clinical staff."," "}  Assessment/Plan  There are no Patient Instructions on file for this visit.  Sigurd Sos, MD  [facility]

## 2021-09-08 ENCOUNTER — Other Ambulatory Visit: Payer: Self-pay | Admitting: Internal Medicine

## 2021-09-08 DIAGNOSIS — Z8669 Personal history of other diseases of the nervous system and sense organs: Secondary | ICD-10-CM

## 2021-09-08 DIAGNOSIS — E559 Vitamin D deficiency, unspecified: Secondary | ICD-10-CM

## 2021-09-08 DIAGNOSIS — E785 Hyperlipidemia, unspecified: Secondary | ICD-10-CM

## 2021-09-09 ENCOUNTER — Ambulatory Visit: Payer: BC Managed Care – PPO | Admitting: Internal Medicine

## 2021-09-25 ENCOUNTER — Ambulatory Visit (INDEPENDENT_AMBULATORY_CARE_PROVIDER_SITE_OTHER): Payer: BC Managed Care – PPO

## 2021-09-25 DIAGNOSIS — J309 Allergic rhinitis, unspecified: Secondary | ICD-10-CM | POA: Diagnosis not present

## 2021-10-12 NOTE — Progress Notes (Signed)
FOLLOW UP Date of Service/Encounter:  10/14/21   Subjective:  Chelsea Dyer (DOB: [birth year]) is a 56 y.o. female who returns to the [facility] on 10/14/2021 in re-evaluation of the following: Seasonal perennial allergic rhinitis History obtained from: chart review and patient.  For Review, LV was on 08/83/21 with Dr. Ernst Bowler.  She was continued on cetirizine, Flonase, Pazeo and allergy shots.  She was diagnosed with acute sinusitis at that visit and prescribed doxycycline 100 mg twice daily x10 days.  Pertinent history/diagnostics: Helyn is on allergen immunotherapy. She receives two injections. Immunotherapy script #1 contains trees. She currently receives 0.68mL of the RED vial (1/100). Immunotherapy script #2 contains dust mites. She currently receives 0.92mL of the RED vial (1/100). She started shots March of 2017 and reached maintenance in March of 2018.  Today presents for yearly follow-up.  She is doing great.  She has had excellent response to allergy shots, but weary to discontinue because she has already discontinued once when moving from [place] to [place] and had to resume.  She works in a [employer] and is around dust and pet dander a lot.  She worries she would have to start all over eventually but understands she would at least get several years of similar control.  She does continue to take an OTC antihistamine daily.  Not sure she needs it, but takes it out of habit.  She is tolerating shots well with no concerns.  Allergies as of 10/14/2021       Reactions   Percocet [oxycodone-acetaminophen] Other (See Comments)   Interfered with patient's antidepressants; made pt feel dizzy   Zinc    Septra [sulfamethoxazole-trimethoprim] Rash        Medication List        Accurate as of October 14, 2021  5:38 PM. If you have any questions, ask your nurse or doctor.          STOP taking these medications    azelastine 0.1 % nasal spray Commonly known as:  ASTELIN Stopped by: Sigurd Sos, MD   Bepotastine Besilate 1.5 % Soln Commonly known as: Bepreve Stopped by: Sigurd Sos, MD   diclofenac 50 MG tablet Commonly known as: CATAFLAM Stopped by: Sigurd Sos, MD   Pazeo 0.7 % Soln Generic drug: Olopatadine HCl Stopped by: Sigurd Sos, MD   PROBIOTIC-10 PO Stopped by: Sigurd Sos, MD       TAKE these medications    busPIRone 15 MG tablet Commonly known as: BUSPAR Take 1 tablet (15 mg total) by mouth 2 (two) times daily.   escitalopram 10 MG tablet Commonly known as: LEXAPRO Take 1 tablet (10 mg total) by mouth daily.   ferrous sulfate 325 (65 FE) MG tablet Take 325 mg by mouth daily with breakfast.   Fish Oil 1000 MG Caps Take by mouth.   fluticasone 50 MCG/ACT nasal spray Commonly known as: FLONASE Place 2 sprays into both nostrils daily.   levocetirizine 5 MG tablet Commonly known as: XYZAL Take 1 tablet (5 mg total) by mouth every evening.   LORazepam 0.5 MG tablet Commonly known as: ATIVAN TAKE ONE TABLET BY MOUTH TWICE A DAY AS NEEDED FOR ANXIETY   meloxicam 15 MG tablet Commonly known as: MOBIC Take 15 mg by mouth daily.   Multi-Vitamins Tabs Take by mouth.   rizatriptan 10 MG tablet Commonly known as: MAXALT TAKE ONE TABLET BY MOUTH AT ONSET OF HEADACHE MAY REPEAT ONE TABLET IN 2 HOURS IF NEEDED.  TURMERIC PO Take 2 tablets by mouth daily.   venlafaxine XR 150 MG 24 hr capsule Commonly known as: EFFEXOR-XR Take 2 capsules (300 mg total) by mouth daily.   Vitamin B 12 500 MCG Tabs Take 500 mcg by mouth daily.   VITAMIN B-6 PO Take by mouth daily.   Vitamin D (Ergocalciferol) 1.25 MG (50000 UNIT) Caps capsule Commonly known as: DRISDOL TAKE 1 CAPSULE (50,000 UNITS TOTAL) BY MOUTH EVERY 7 (SEVEN) DAYS FOR 12 DOSES.   zolmitriptan 5 MG tablet Commonly known as: ZOMIG TAKE 1 TABLET BY MOUTH FOR MIGRAINE HEADACHE. MAY REPEAT IN 2 HOURS IF NEEDED. NO MORE THAN 2 DOSES IN 24-HOUR PERIOD.        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  ROS: All others negative except as noted per HPI.   Objective:  BP 120/72 (BP Location: Right Arm, Patient Position: Sitting, Cuff Size: Normal)    Pulse 90    Temp 98.1 F (36.7 C) (Temporal)    Resp 16    Ht 5' 1.5" (1.562 m)    Wt 156 lb 6.4 oz (70.9 kg)    LMP 08/24/2018    SpO2 96%    BMI 29.07 kg/m  Body mass index is 29.07 kg/m. Physical Exam: General Appearance:  Alert, cooperative, no distress, appears stated age  Head:  Normocephalic, without obvious abnormality, atraumatic  Eyes:  Conjunctiva clear, EOM's intact  Nose: Nares normal, normal mucosa, no visible anterior polyps, and septum midline  Throat: Lips, tongue normal; teeth and gums normal, normal posterior oropharynx  Neck: Supple, symmetrical  Lungs:   clear to auscultation bilaterally, Respirations unlabored, no coughing  Heart:  regular rate and rhythm and no murmur, Appears well perfused  Extremities: No edema  Skin: Skin color, texture, turgor normal, no rashes or lesions on visualized portions of skin  Neurologic: No gross deficits   Assessment/Plan   Patient Instructions  1. Allergic rhinitis - on allergen immunotherapy since 2017 - Continue cetirizine 10 mg once a day as needed for a runny nose - Continue Flonase 1-2 sprays in each nostril once a day as needed for a stuffy nose.  - Continue with Pataday- one drop per eye daily as needed. - Continue with allergy shots at the same schedule  Follow-up in 1  year   Please inform us of any Emergency Department visits, hospitalizations, or changes in symptoms. Call us before going to the ED for breathing or allergy symptoms since we might be able to fit you in for a sick visit. Feel free to contact us anytime with any questions, problems, or concerns.  It was a pleasure to meet you today!     Sigurd Sos, MD  [facility]

## 2021-10-14 ENCOUNTER — Ambulatory Visit: Payer: BC Managed Care – PPO | Admitting: Internal Medicine

## 2021-10-14 ENCOUNTER — Other Ambulatory Visit: Payer: Self-pay

## 2021-10-14 ENCOUNTER — Encounter: Payer: Self-pay | Admitting: Internal Medicine

## 2021-10-14 VITALS — BP 120/72 | HR 90 | Temp 98.1°F | Resp 16 | Ht 61.5 in | Wt 156.4 lb

## 2021-10-14 DIAGNOSIS — J3089 Other allergic rhinitis: Secondary | ICD-10-CM | POA: Diagnosis not present

## 2021-10-14 DIAGNOSIS — J302 Other seasonal allergic rhinitis: Secondary | ICD-10-CM

## 2021-10-14 MED ORDER — EPINEPHRINE 0.3 MG/0.3ML IJ SOAJ: 0.3000 mg | Freq: Once | INTRAMUSCULAR | 2 refills | Status: AC

## 2021-10-14 NOTE — Addendum Note (Signed)
Addended by: Clemon Chambers on: 10/14/2021 05:40 PM   Modules accepted: Orders, Level of Service

## 2021-10-14 NOTE — Patient Instructions (Addendum)
1. Allergic rhinitis - on allergen immunotherapy since 2017; controlled - Continue cetirizine 10 mg once a day as needed for a runny nose - Continue Flonase 1-2 sprays in each nostril once a day as needed for a stuffy nose.  - Continue with Pataday- one drop per eye daily as needed. - Continue with allergy shots at the same schedule  Follow-up in 1 year   Please inform us of any Emergency Department visits, hospitalizations, or changes in symptoms. Call us before going to the ED for breathing or allergy symptoms since we might be able to fit you in for a sick visit. Feel free to contact us anytime with any questions, problems, or concerns.  It was a pleasure to meet you today!

## 2021-10-16 ENCOUNTER — Ambulatory Visit: Payer: BC Managed Care – PPO | Admitting: Psychiatry

## 2021-10-22 ENCOUNTER — Ambulatory Visit (INDEPENDENT_AMBULATORY_CARE_PROVIDER_SITE_OTHER): Payer: BC Managed Care – PPO

## 2021-10-22 DIAGNOSIS — J309 Allergic rhinitis, unspecified: Secondary | ICD-10-CM | POA: Diagnosis not present

## 2021-10-28 ENCOUNTER — Ambulatory Visit (INDEPENDENT_AMBULATORY_CARE_PROVIDER_SITE_OTHER): Payer: BC Managed Care – PPO

## 2021-10-28 DIAGNOSIS — J309 Allergic rhinitis, unspecified: Secondary | ICD-10-CM

## 2021-11-04 ENCOUNTER — Ambulatory Visit (INDEPENDENT_AMBULATORY_CARE_PROVIDER_SITE_OTHER): Payer: BC Managed Care – PPO

## 2021-11-04 DIAGNOSIS — J309 Allergic rhinitis, unspecified: Secondary | ICD-10-CM

## 2021-11-06 ENCOUNTER — Other Ambulatory Visit: Payer: Self-pay | Admitting: Internal Medicine

## 2021-11-06 ENCOUNTER — Other Ambulatory Visit: Payer: Self-pay | Admitting: Psychiatry

## 2021-11-06 DIAGNOSIS — E559 Vitamin D deficiency, unspecified: Secondary | ICD-10-CM

## 2021-11-06 DIAGNOSIS — F419 Anxiety disorder, unspecified: Secondary | ICD-10-CM

## 2021-11-09 NOTE — Telephone Encounter (Signed)
Pt would like refill of Buspar sent to [facility].  No upcoming appt scheduled.

## 2021-11-10 ENCOUNTER — Telehealth: Payer: Self-pay | Admitting: *Deleted

## 2021-11-10 NOTE — Telephone Encounter (Signed)
PA started for effexor xr Key: [id number]

## 2021-11-11 ENCOUNTER — Ambulatory Visit (INDEPENDENT_AMBULATORY_CARE_PROVIDER_SITE_OTHER): Payer: BC Managed Care – PPO

## 2021-11-11 DIAGNOSIS — J309 Allergic rhinitis, unspecified: Secondary | ICD-10-CM

## 2021-11-12 NOTE — Telephone Encounter (Signed)
More information was needed.  Last office note faxed. ?

## 2021-11-18 ENCOUNTER — Ambulatory Visit (INDEPENDENT_AMBULATORY_CARE_PROVIDER_SITE_OTHER): Payer: BC Managed Care – PPO

## 2021-11-18 DIAGNOSIS — J309 Allergic rhinitis, unspecified: Secondary | ICD-10-CM | POA: Diagnosis not present

## 2021-11-28 ENCOUNTER — Other Ambulatory Visit: Payer: Self-pay | Admitting: Internal Medicine

## 2021-11-28 DIAGNOSIS — E559 Vitamin D deficiency, unspecified: Secondary | ICD-10-CM

## 2021-12-02 ENCOUNTER — Ambulatory Visit: Payer: BC Managed Care – PPO | Admitting: Psychiatry

## 2021-12-06 ENCOUNTER — Other Ambulatory Visit: Payer: Self-pay | Admitting: Internal Medicine

## 2021-12-06 DIAGNOSIS — Z8669 Personal history of other diseases of the nervous system and sense organs: Secondary | ICD-10-CM

## 2021-12-14 ENCOUNTER — Telehealth: Payer: Self-pay | Admitting: Internal Medicine

## 2021-12-14 NOTE — Telephone Encounter (Signed)
Patient called because she forgot to take her daily medications Saturday morning (12/12/21) and ended up taking them about 5 o'clock that evening. Patient needs to know how long it will take for her medications to enter her system again. ? ? ? ?Good callback number is [phone] ? ? ? ? ?Please advise  ?

## 2021-12-15 DIAGNOSIS — R42 Dizziness and giddiness: Secondary | ICD-10-CM | POA: Diagnosis not present

## 2021-12-15 DIAGNOSIS — R110 Nausea: Secondary | ICD-10-CM | POA: Diagnosis not present

## 2021-12-15 DIAGNOSIS — R197 Diarrhea, unspecified: Secondary | ICD-10-CM | POA: Diagnosis not present

## 2021-12-15 NOTE — Telephone Encounter (Signed)
Pt called in to check the status of prior message. ? ?

## 2021-12-16 ENCOUNTER — Ambulatory Visit: Payer: BC Managed Care – PPO | Admitting: Psychiatry

## 2021-12-17 ENCOUNTER — Encounter: Payer: Self-pay | Admitting: Internal Medicine

## 2021-12-17 ENCOUNTER — Other Ambulatory Visit: Payer: Self-pay | Admitting: Psychiatry

## 2021-12-17 ENCOUNTER — Other Ambulatory Visit: Payer: Self-pay | Admitting: Internal Medicine

## 2021-12-17 ENCOUNTER — Telehealth (INDEPENDENT_AMBULATORY_CARE_PROVIDER_SITE_OTHER): Payer: Self-pay | Admitting: Internal Medicine

## 2021-12-17 VITALS — Wt 150.0 lb

## 2021-12-17 DIAGNOSIS — E785 Hyperlipidemia, unspecified: Secondary | ICD-10-CM

## 2021-12-17 DIAGNOSIS — R5383 Other fatigue: Secondary | ICD-10-CM

## 2021-12-17 DIAGNOSIS — F339 Major depressive disorder, recurrent, unspecified: Secondary | ICD-10-CM

## 2021-12-17 DIAGNOSIS — E559 Vitamin D deficiency, unspecified: Secondary | ICD-10-CM

## 2021-12-17 NOTE — Progress Notes (Signed)
? ? ?Virtual Visit via Video Note ? ?I connected with Chelsea Dyer on 12/17/21 at  3:30 PM EDT by a video enabled telemedicine application and verified that I am speaking with the correct person using two identifiers. ? ?Location patient: home ?Location provider: work office ?Persons participating in the virtual visit: patient, provider ? ?I discussed the limitations of evaluation and management by telemedicine and the availability of in person appointments. The patient expressed understanding and agreed to proceed. ? ? ?HPI: ?On Saturday, 5 days ago, she forgot to take her medications that she usually takes in the morning until 5 PM that day.  She had a syndrome of nausea, fatigue, blurry vision and diaphoresis that prompted urgent care evaluation.  While evaluated there she was told that because of her gait instability they thought she needed an ED evaluation.  She has taken a couple home COVID tests that have been negative.  She is almost 100% better today.  She tells me that this syndrome usually happens when she forgets to take her medications in almost the exact fashion. ? ? ?ROS: ?Constitutional: Denies fever, chills, diaphoresis, appetite change. ?HEENT: Denies photophobia, eye pain, redness, hearing loss, ear pain, congestion, sore throat, rhinorrhea, sneezing, mouth sores, trouble swallowing, neck pain, neck stiffness and tinnitus.   ?Respiratory: Denies SOB, DOE, cough, chest tightness,  and wheezing.   ?Cardiovascular: Denies chest pain, palpitations and leg swelling.  ?Gastrointestinal: Denies nausea, vomiting, abdominal pain, diarrhea, constipation, blood in stool and abdominal distention.  ?Genitourinary: Denies dysuria, urgency, frequency, hematuria, flank pain and difficulty urinating.  ?Endocrine: Denies: hot or cold intolerance, sweats, changes in hair or nails, polyuria, polydipsia. ?Musculoskeletal: Denies myalgias, back pain, joint swelling, arthralgias and gait problem.  ?Skin: Denies pallor,  rash and wound.  ?Neurological: Denies dizziness, seizures, syncope, weakness, light-headedness, numbness and headaches.  ?Hematological: Denies adenopathy. Easy bruising, personal or family bleeding history  ?Psychiatric/Behavioral: Denies suicidal ideation, mood changes, confusion, nervousness, sleep disturbance and agitation ? ? ?Past Medical History:  ?Diagnosis Date  ? ALLERGIC RHINITIS 03/04/2008  ? Anxiety   ? DEPRESSION 03/04/2008  ? Edema   ? pedal  ? EXOGENOUS OBESITY 06/22/2010  ? 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 Prolactinoma ([place] [facility])   ? ? ?Past Surgical History:  ?Procedure Laterality Date  ? BREAST SURGERY  2004  ? augmentation  ? HAMMER TOE SURGERY    ? LIPOSUCTION    ? NASAL SINUS SURGERY    ? ? ?Family History  ?Problem Relation Age of Onset  ? Arthritis Mother   ?     osteo  ? Allergic rhinitis Mother   ? Depression Mother   ? Down syndrome Daughter   ? Breast cancer Neg Hx   ? ? ?SOCIAL HX:  ? reports that she has never smoked. She has never used smokeless tobacco. She reports that she does not drink alcohol and does not use drugs. ? ? ?Current Outpatient Medications:  ?  busPIRone (BUSPAR) 15 MG tablet, TAKE 1 TABLET BY MOUTH 2 TIMES DAILY., Disp: 180 tablet, Rfl: 0 ?  Cyanocobalamin (VITAMIN B 12) 500 MCG TABS, Take 500 mcg by mouth daily., Disp: 90 tablet, Rfl: 1 ?  escitalopram (LEXAPRO) 10 MG tablet, Take 1 tablet (10 mg total) by mouth daily., Disp: 90 tablet, Rfl: 1 ?  ferrous sulfate 325 (65 FE) MG tablet, Take 325 mg by mouth daily with breakfast., Disp: , Rfl:  ?  fluticasone (FLONASE) 50 MCG/ACT nasal spray, Place 2 sprays into both nostrils  daily., Disp: 18.2 g, Rfl: 2 ?  levocetirizine (XYZAL) 5 MG tablet, Take 1 tablet (5 mg total) by mouth every evening., Disp: 90 tablet, Rfl: 1 ?  loperamide (IMODIUM A-D) 2 MG tablet, Take by mouth., Disp: , Rfl:  ?  LORazepam (ATIVAN) 0.5 MG tablet, TAKE ONE TABLET BY MOUTH TWICE A DAY AS NEEDED FOR ANXIETY, Disp: 60 tablet, Rfl: 3 ?  meloxicam  (MOBIC) 15 MG tablet, Take 15 mg by mouth daily., Disp: , Rfl:  ?  Multiple Vitamin (MULTI-VITAMINS) TABS, Take by mouth., Disp: , Rfl:  ?  Omega-3 Fatty Acids (FISH OIL) 1000 MG CAPS, Take by mouth., Disp: , Rfl:  ?  Pyridoxine HCl (VITAMIN B-6 PO), Take by mouth daily., Disp: , Rfl:  ?  rizatriptan (MAXALT) 10 MG tablet, TAKE ONE TABLET BY MOUTH AT ONSET OF HEADACHE MAY REPEAT ONE TABLET IN 2 HOURS IF NEEDED., Disp: 9 tablet, Rfl: 2 ?  TURMERIC PO, Take 2 tablets by mouth daily., Disp: , Rfl:  ?  venlafaxine XR (EFFEXOR-XR) 150 MG 24 hr capsule, Take 2 capsules (300 mg total) by mouth daily., Disp: 180 capsule, Rfl: 1 ?  zolmitriptan (ZOMIG) 5 MG tablet, TAKE 1 TABLET BY MOUTH FOR MIGRAINE HEADACHE. MAY REPEAT IN 2 HOURS IF NEEDED. NO MORE THAN 2 DOSES IN 24-HOUR PERIOD., Disp: 5 tablet, Rfl: 1 ?  promethazine (PHENERGAN) 12.5 MG tablet, Take by mouth., Disp: , Rfl:  ? ?EXAM:  ? ?VITALS per patient if applicable: None reported ? ?GENERAL: alert, oriented, appears well and in no acute distress ? ?HEENT: atraumatic, conjunttiva clear, no obvious abnormalities on inspection of external nose and ears ? ?NECK: normal movements of the head and neck ? ?LUNGS: on inspection no signs of respiratory distress, breathing rate appears normal, no obvious gross increased work of breathing, gasping or wheezing ? ?CV: no obvious cyanosis ? ?MS: moves all visible extremities without noticeable abnormality ? ?PSYCH/NEURO: pleasant and cooperative, no obvious depression or anxiety, speech and thought processing grossly intact ? ?ASSESSMENT AND PLAN: ? ? ?Fatigue, unspecified type ? ?-Etiology remains unclear, I feel like this constellation of symptoms feels out of proportion for being behind on 1 dose of her medication by 12 hours over 5 days ago.  Nonetheless she states that this happens every time she forgets.  We have discussed setting an alarm to remind her to take her medications to avoid this.  Since she is almost back to  baseline I do not believe further intervention is necessary. ?  ?I discussed the assessment and treatment plan with the patient. The patient was provided an opportunity to ask questions and all were answered. The patient agreed with the plan and demonstrated an understanding of the instructions. ?  ?The patient was advised to call back or seek an in-person evaluation if the symptoms worsen or if the condition fails to improve as anticipated. ? ? ? ?Lelon Frohlich, MD  ?[facility] ? ?

## 2021-12-21 ENCOUNTER — Ambulatory Visit: Payer: BC Managed Care – PPO | Admitting: Psychiatry

## 2021-12-21 NOTE — Progress Notes (Unsigned)
?      [facility] Counselor/Therapist Progress Note ? ?Patient ID: Chelsea Dyer, MRN: [id number],   ? ?Date: 12/21/2021 ? ?Time Spent: ***  ? ?Treatment Type: {CHL AMB THERAPY TYPES:[id number]} ? ?Reported Symptoms: *** ? ?Mental Status Exam: ? ?Appearance:   {PSY:22683}     ?Behavior:  {PSY:21022743}  ?Motor:  {PSY:22302}  ?Speech/Language:   {PSY:22685}  ?Affect:  {PSY:22687}  ?Mood:  {PSY:31886}  ?Thought process:  {PSY:31888}  ?Thought content:    {PSY:980-205-6894}  ?Sensory/Perceptual disturbances:    {PSY:7042723844}  ?Orientation:  {PSY:30297}  ?Attention:  {PSY:22877}  ?Concentration:  {PSY:641-451-0867}  ?Memory:  {[phone]}  ?Fund of knowledge:   {PSY:641-451-0867}  ?Insight:    {PSY:641-451-0867}  ?Judgment:   {PSY:641-451-0867}  ?Impulse Control:  {PSY:641-451-0867}  ? ?Risk Assessment: ?Danger to Self:  {PSY:22692} ?Self-injurious Behavior: {PSY:22692} ?Danger to Others: {PSY:22692} ?Duty to Warn:{PSY:311194} ?Physical Aggression / Violence:{PSY:21197} ?Access to Firearms a concern: {PSY:21197} ?Gang Involvement:{PSY:21197} ? ?Subjective: ***  ? ?Interventions: {PSY:786-156-7813} ? ?Diagnosis:No diagnosis found. ? ?Plan: *** ? ?Shanon Ace, LCSW ? ? ? ? ? ? ? ? ? ? ? ? ? ? ? ? ? ? ?

## 2021-12-24 ENCOUNTER — Ambulatory Visit (INDEPENDENT_AMBULATORY_CARE_PROVIDER_SITE_OTHER): Payer: BC Managed Care – PPO

## 2021-12-24 DIAGNOSIS — J309 Allergic rhinitis, unspecified: Secondary | ICD-10-CM

## 2021-12-31 ENCOUNTER — Other Ambulatory Visit: Payer: Self-pay | Admitting: Internal Medicine

## 2021-12-31 DIAGNOSIS — E559 Vitamin D deficiency, unspecified: Secondary | ICD-10-CM

## 2022-01-05 ENCOUNTER — Ambulatory Visit: Payer: BC Managed Care – PPO | Admitting: Psychiatry

## 2022-01-20 ENCOUNTER — Encounter: Payer: Self-pay | Admitting: Psychiatry

## 2022-01-20 ENCOUNTER — Ambulatory Visit (INDEPENDENT_AMBULATORY_CARE_PROVIDER_SITE_OTHER): Payer: BC Managed Care – PPO

## 2022-01-20 ENCOUNTER — Ambulatory Visit: Payer: BC Managed Care – PPO | Admitting: Psychiatry

## 2022-01-20 DIAGNOSIS — F419 Anxiety disorder, unspecified: Secondary | ICD-10-CM | POA: Diagnosis not present

## 2022-01-20 DIAGNOSIS — J309 Allergic rhinitis, unspecified: Secondary | ICD-10-CM

## 2022-01-20 DIAGNOSIS — F339 Major depressive disorder, recurrent, unspecified: Secondary | ICD-10-CM | POA: Diagnosis not present

## 2022-01-20 MED ORDER — VENLAFAXINE HCL ER 150 MG PO CP24: 300.0000 mg | ORAL_CAPSULE | Freq: Every day | ORAL | 1 refills | Status: DC

## 2022-01-20 MED ORDER — BUSPIRONE HCL 15 MG PO TABS: 15.0000 mg | ORAL_TABLET | Freq: Two times a day (BID) | ORAL | 3 refills | Status: DC

## 2022-01-20 MED ORDER — ESCITALOPRAM OXALATE 10 MG PO TABS: 10.0000 mg | ORAL_TABLET | Freq: Every day | ORAL | 3 refills | Status: DC

## 2022-01-20 MED ORDER — LORAZEPAM 0.5 MG PO TABS: ORAL_TABLET | ORAL | 0 refills | Status: DC

## 2022-01-20 NOTE — Progress Notes (Signed)
Chelsea Dyer ?[id number] ?[birth year] 56 y.o. ? ?Subjective:  ? ?Patient ID:  Chelsea Dyer is a 56 y.o. (DOB [birth year]) female. ? ?Chief Complaint:  ?Chief Complaint  ?Patient presents with  ? Follow-up  ?  Anxiety, depression, and insomnia  ? ? ?HPI ?Chelsea Dyer presents to the office today for follow-up of anxiety, depression, and insomnia.  ? 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 She reports that she transitioned back to this job and is happy with this. She is able to bring her 22 yo daughter with Chelsea Dyer Syndrome and her dog to the store. Husband is in the hospital and has a guardian. Husband has Parkinsons and dementia. She has been going to church every Sunday and reports that this has been helpful for her outlook.  ? ?She reports that her anxiety has been manageable. Denies depressed mood. Denies irritability. Sleeping well. Appetite has been ok. She lost 30 lbs intentionally. Energy and motivation have been ok. Concentration is adequate. Denies SI.  ? ?Has not taken Ativan recently. Last filled 04/15/21.  ? ?Past Psychiatric Medication Trials: ?Effexor XR- Has taken over 10 years. She reports that it has been helpful for mood and anxiety. Denies side effects. Has not been on doses about 225 mg. ?Lexapro- Has taken for about a year. Has helped some for depression ?Prozac ?Wellbutrin-Ineffective ?Buspar- Insomnia with doses later in the day. ?Lorazepam- Helpful ?Valium ?May have taken Zoloft ? ?PHQ2-9   ? ?[place] Office Visit from 07/08/2021 in [place] at [place] Video Visit from 09/24/2020 in [place] at [facility] from 06/06/2020 in [place] at [facility] from 12/05/2019 in [place] at [place] from 10/26/2019 in [place] at [place]  ?PHQ-2 Total Score 0 1 0 4 2  ?PHQ-9 Total Score 0 5 0 9 2  ? ?  ?  ? ?Review of Systems:  ?Review of Systems   ?Musculoskeletal:  Negative for gait problem.  ?Allergic/Immunologic: Positive for environmental allergies.  ?Neurological:  Negative for tremors.  ?     Occ migraines  ?Psychiatric/Behavioral:    ?     Please refer to HPI  ? ?Medications: I have reviewed the patient's current medications. ? ?Current Outpatient Medications  ?Medication Sig Dispense Refill  ? [facility] B-12 500 MCG tablet TAKE 500 MCG BY MOUTH DAILY. 90 tablet 1  ? ferrous sulfate 325 (65 FE) MG tablet Take 325 mg by mouth daily with breakfast.    ? fluticasone (FLONASE) 50 MCG/ACT nasal spray Place 2 sprays into both nostrils daily. 18.2 g 2  ? levocetirizine (XYZAL) 5 MG tablet Take 1 tablet (5 mg total) by mouth every evening. 90 tablet 1  ? meloxicam (MOBIC) 15 MG tablet Take 15 mg by mouth daily.    ? Multiple Vitamin (MULTI-VITAMINS) TABS Take by mouth.    ? Omega-3 Fatty Acids (FISH OIL) 1000 MG CAPS Take by mouth.    ? Pyridoxine HCl (VITAMIN B-6 PO) Take by mouth daily.    ? rizatriptan (MAXALT) 10 MG tablet TAKE ONE TABLET BY MOUTH AT ONSET OF HEADACHE MAY REPEAT ONE TABLET IN 2 HOURS IF NEEDED. 9 tablet 2  ? TURMERIC PO Take 2 tablets by mouth daily.    ? atorvastatin (LIPITOR) 40 MG tablet TAKE 1 TABLET BY MOUTH EVERY DAY (Patient not taking: Reported on 01/20/2022) 90 tablet 1  ? busPIRone (BUSPAR) 15 MG tablet Take 1 tablet (15  mg total) by mouth 2 (two) times daily. 180 tablet 3  ? escitalopram (LEXAPRO) 10 MG tablet Take 1 tablet (10 mg total) by mouth daily. 90 tablet 3  ? LORazepam (ATIVAN) 0.5 MG tablet TAKE ONE TABLET BY MOUTH TWICE A DAY AS NEEDED FOR ANXIETY 60 tablet 0  ? venlafaxine XR (EFFEXOR-XR) 150 MG 24 hr capsule Take 2 capsules (300 mg total) by mouth daily. 180 capsule 1  ? zolmitriptan (ZOMIG) 5 MG tablet TAKE 1 TABLET BY MOUTH FOR MIGRAINE HEADACHE. MAY REPEAT IN 2 HOURS IF NEEDED. NO MORE THAN 2 DOSES IN 24-HOUR PERIOD. 5 tablet 1  ? ?No current facility-administered medications for this visit.  ? ? ?Medication Side  Effects: None ? ?Allergies:  ?Allergies  ?Allergen Reactions  ? Percocet [Oxycodone-Acetaminophen] Other (See Comments)  ?  Interfered with patient's antidepressants; made pt feel dizzy  ? Zinc   ? Septra [Sulfamethoxazole-Trimethoprim] Rash  ? ? ?Past Medical History:  ?Diagnosis Date  ? ALLERGIC RHINITIS 03/04/2008  ? Anxiety   ? DEPRESSION 03/04/2008  ? Edema   ? pedal  ? EXOGENOUS OBESITY 06/22/2010  ? 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 Prolactinoma ([place])   ? ? ?Past Medical History, Surgical history, Social history, and Family history were reviewed and updated as appropriate.  ? ?Please see review of systems for further details on the patient's review from today.  ? ?Objective:  ? ?Physical Exam:  ?Wt 155 lb (70.3 kg)   LMP 08/24/2018   BMI 28.81 kg/m?  ? ?Physical Exam ?Constitutional:   ?   General: She is not in acute distress. ?Musculoskeletal:     ?   General: No deformity.  ?Neurological:  ?   Mental Status: She is alert and oriented to person, place, and time.  ?   Coordination: Coordination normal.  ?Psychiatric:     ?   Attention and Perception: Attention and perception normal. She does not perceive auditory or visual hallucinations.     ?   Mood and Affect: Mood normal. Mood is not anxious or depressed. Affect is not labile, blunt, angry or inappropriate.     ?   Speech: Speech normal.     ?   Behavior: Behavior normal.     ?   Thought Content: Thought content normal. Thought content is not paranoid or delusional. Thought content does not include homicidal or suicidal ideation. Thought content does not include homicidal or suicidal plan.     ?   Cognition and Memory: Cognition and memory normal.     ?   Judgment: Judgment normal.  ?   Comments: Insight intact  ? ? ?Lab Review:  ?   ?Component Value Date/Time  ? NA 139 01/23/2021 0938  ? K 4.5 01/23/2021 0938  ? CL 104 01/23/2021 0938  ? CO2 24 01/23/2021 0938  ? GLUCOSE 93 01/23/2021 0938  ? BUN 16 01/23/2021 0938  ? CREATININE 0.59 01/23/2021 0938   ? CALCIUM 9.6 01/23/2021 0938  ? PROT 7.1 01/23/2021 0938  ? ALBUMIN 4.5 01/23/2021 0938  ? AST 17 01/23/2021 0938  ? ALT 29 01/23/2021 0938  ? ALKPHOS 91 01/23/2021 0938  ? BILITOT 0.4 01/23/2021 0938  ? GFRNONAA >60 02/01/2015 1440  ? GFRAA >60 02/01/2015 1440  ? ? ?   ?Component Value Date/Time  ? WBC 5.3 01/23/2021 0938  ? RBC 4.93 01/23/2021 0938  ? HGB 14.8 01/23/2021 0938  ? HCT 43.4 01/23/2021 0938  ? PLT 286.0 01/23/2021 0938  ?  MCV 88.1 01/23/2021 0938  ? MCH 29.1 02/01/2015 1440  ? MCHC 34.2 01/23/2021 0938  ? RDW 13.7 01/23/2021 0938  ? LYMPHSABS 1.6 01/23/2021 0938  ? MONOABS 0.5 01/23/2021 0938  ? EOSABS 0.4 01/23/2021 0938  ? BASOSABS 0.0 01/23/2021 0938  ? ? ?No results found for: POCLITH, LITHIUM  ? ?No results found for: PHENYTOIN, PHENOBARB, VALPROATE, CBMZ  ? ?.res ?Assessment: Plan:   ?Pt seen for 30 minutes and time spent reviewing changes to her social history and reviewing symptoms. Will continue current plan of care since target signs and symptoms are well controlled without any tolerability issues. Discussed long term goal of possibly reducing meds when stressors improve and recommended reducing medication gradually at that time to minimize risk of discontinuation symptoms.  ?Continue Lexapro 10 mg po qd for mood and anxiety.  ?Continue Buspar 15 mg po BID for anxiety.  ?Continue Effexor XR 300 mg po qd for anxiety and depression.  ?Continue Lorazepam 0.5 mg po BID prn anxiety. She reports that she rarely takes Lorazepam prn. Will send script to be on file since previous refills have expired. ?Pt to follow-up in one year or sooner if clinically indicated.  ?Patient advised to contact office with any questions, adverse effects, or acute worsening in signs and symptoms. ? ?Zela was seen today for follow-up. ? ?Diagnoses and all orders for this visit: ? ?Depression, recurrent (HCC) ?-     venlafaxine XR (EFFEXOR-XR) 150 MG 24 hr capsule; Take 2 capsules (300 mg total) by mouth daily. ?-      escitalopram (LEXAPRO) 10 MG tablet; Take 1 tablet (10 mg total) by mouth daily. ? ?Anxiety ?-     busPIRone (BUSPAR) 15 MG tablet; Take 1 tablet (15 mg total) by mouth 2 (two) times daily. ?-     LORazepam (ATIVAN) 0.5 M

## 2022-01-27 DIAGNOSIS — M9905 Segmental and somatic dysfunction of pelvic region: Secondary | ICD-10-CM | POA: Diagnosis not present

## 2022-01-27 DIAGNOSIS — M9904 Segmental and somatic dysfunction of sacral region: Secondary | ICD-10-CM | POA: Diagnosis not present

## 2022-01-27 DIAGNOSIS — M545 Low back pain, unspecified: Secondary | ICD-10-CM | POA: Diagnosis not present

## 2022-01-27 DIAGNOSIS — M25552 Pain in left hip: Secondary | ICD-10-CM | POA: Diagnosis not present

## 2022-02-05 ENCOUNTER — Other Ambulatory Visit: Payer: Self-pay | Admitting: Internal Medicine

## 2022-02-05 DIAGNOSIS — E559 Vitamin D deficiency, unspecified: Secondary | ICD-10-CM

## 2022-02-05 DIAGNOSIS — Z8669 Personal history of other diseases of the nervous system and sense organs: Secondary | ICD-10-CM

## 2022-02-17 ENCOUNTER — Ambulatory Visit (INDEPENDENT_AMBULATORY_CARE_PROVIDER_SITE_OTHER): Payer: BC Managed Care – PPO

## 2022-02-17 DIAGNOSIS — J309 Allergic rhinitis, unspecified: Secondary | ICD-10-CM

## 2022-02-24 DIAGNOSIS — M9907 Segmental and somatic dysfunction of upper extremity: Secondary | ICD-10-CM | POA: Diagnosis not present

## 2022-02-24 DIAGNOSIS — M9908 Segmental and somatic dysfunction of rib cage: Secondary | ICD-10-CM | POA: Diagnosis not present

## 2022-02-24 DIAGNOSIS — M25512 Pain in left shoulder: Secondary | ICD-10-CM | POA: Diagnosis not present

## 2022-02-24 DIAGNOSIS — M9901 Segmental and somatic dysfunction of cervical region: Secondary | ICD-10-CM | POA: Diagnosis not present

## 2022-03-10 ENCOUNTER — Other Ambulatory Visit: Payer: Self-pay | Admitting: Internal Medicine

## 2022-03-10 DIAGNOSIS — M9906 Segmental and somatic dysfunction of lower extremity: Secondary | ICD-10-CM | POA: Diagnosis not present

## 2022-03-10 DIAGNOSIS — M9902 Segmental and somatic dysfunction of thoracic region: Secondary | ICD-10-CM | POA: Diagnosis not present

## 2022-03-10 DIAGNOSIS — M9904 Segmental and somatic dysfunction of sacral region: Secondary | ICD-10-CM | POA: Diagnosis not present

## 2022-03-10 DIAGNOSIS — M546 Pain in thoracic spine: Secondary | ICD-10-CM | POA: Diagnosis not present

## 2022-03-10 DIAGNOSIS — E559 Vitamin D deficiency, unspecified: Secondary | ICD-10-CM

## 2022-03-10 DIAGNOSIS — Z8669 Personal history of other diseases of the nervous system and sense organs: Secondary | ICD-10-CM

## 2022-03-25 ENCOUNTER — Ambulatory Visit (INDEPENDENT_AMBULATORY_CARE_PROVIDER_SITE_OTHER): Payer: BC Managed Care – PPO

## 2022-03-25 DIAGNOSIS — J309 Allergic rhinitis, unspecified: Secondary | ICD-10-CM

## 2022-03-25 NOTE — Progress Notes (Signed)
VIALS EXP 03-26-23

## 2022-03-26 DIAGNOSIS — J3089 Other allergic rhinitis: Secondary | ICD-10-CM | POA: Diagnosis not present

## 2022-04-02 ENCOUNTER — Ambulatory Visit: Payer: BC Managed Care – PPO | Admitting: Psychiatry

## 2022-04-07 ENCOUNTER — Ambulatory Visit: Payer: BC Managed Care – PPO | Admitting: Psychiatry

## 2022-04-07 DIAGNOSIS — F419 Anxiety disorder, unspecified: Secondary | ICD-10-CM

## 2022-04-07 NOTE — Progress Notes (Unsigned)
[facility] Counselor/Therapist Progress Note  Patient ID: Nautica Hotz, MRN: [id number],    Date: 04/07/2022  Time Spent: 55 minutes   Treatment Type: Individual Therapy  Reported Symptoms: anxiety, depression  Mental Status Exam:  Appearance:   Casual and Neat     Behavior:  Appropriate and Sharing  Motor:  Normal  Speech/Language:   Clear and Coherent  Affect:  Depressed and anxious  Mood:  anxious and depressed  Thought process:  normal  Thought content:    Reports obsessive thoughts "about current issues" and overthinking  Sensory/Perceptual disturbances:    WNL  Orientation:  oriented to person, place, time/date, situation, day of week, month of year, year, and stated date of April 07, 2022  Attention:  "Sometimes good and sometimes fair."  Concentration:  "Sometimes good and sometimes fair"  Memory:  WNL  Fund of knowledge:   Good  Insight:    "Good"  Judgment:   "Good"  Impulse Control:  "Good"   Risk Assessment: Danger to Self:  No Self-injurious Behavior: No Danger to Others: No Duty to Warn:no Physical Aggression / Violence:No  Access to Firearms a concern: No  Gang Involvement:No   Subjective: Patient in today after not being seen for almost a year, appearing very tearful and upset and reporting recent paperwork received from "[facility]" who delivered it to her door "within past few days".  The paperwork was indicating a current investigation of patient which law enforcement is handling.  Patient wrongly denies the allegations in the paperwork that she received and is anxious to "get back my personal belongings they took" which reportedly included her phone and a couple of laptops.  (Details of the contents of paperwork are not shared in this note due to patient privacy needs.)  Assisted patient today in processing some of her fears and anxieties, and trying to help her feel more grounded and staying in the present without jumping ahead and  assuming different scenarios.  She did talk through a lot of her feelings and was calmer today by end of session.  Did not get into details of legal paperwork she received but rather focused on her immediate mental health needs and stability, wanting to ensure her safety, and also help patient feel supported.  Due to some factors in this situation and not having seen patient in almost a year, I explained to patient that I was not able to take her on as an ongoing patient, and am willing to help seek a more appropriate option for her regarding counseling that might suit her needs well right now under current conditions of her situation.  I did consult later today with our lead psychiatrist here at our office who supported this decision.  She actually accepted that rather well and seemed to appreciate being able to talk today at length about her situation and trying to help her stabilize and feel more grounded.  Denies thoughts to harm herself and agrees that if she were to start experiencing suicidal ideation she would seek help at [facility] inpatient facility. Adds that she knows how to reach out and access that source of help.  Interventions: Solution-Oriented/Positive Psychology and Ego-Supportive  Treatment Goals: Goals remain on Treatment Plan as patient works on strategies to achieve her goals.  Progress will be assessed each session. Long term goal: Reduce overall level, frequency, and intensity of the anxiety so that daily functioning is not impaired.  Short term goal: Identify, challenge, and  replace anxious/depressive/negative thoughts and self talk with more positive, realistic, and empowering thoughts and self talk that do not support anxiety nor depression.  Strategies: 1)Teach patient to implement "thought stopping" techniques regarding anxieties/fears/and worries that may have been addressed but still persist. 2)Review helpful behaviors and strategies and have patient go  through the process of: Review, repeat, and reinforce success   Diagnosis:   ICD-10-CM   1. Anxiety  F41.9      Plan:  Patient in today showing motivation and active participation in session.  This therapist has not seen patient in almost a year and as noted above most of the focused today was on some recent legal issues involving patient.  She presented today very upset and we primarily focused on her being able to express her feelings, receive emotional support, and helping her reach a point of feeling more grounded by the end of session.  As noted above, not all details are included in this note due to patient privacy needs.  This record has been created using [employer].  Chart creation errors have been sought, but may not always have been located and corrected.  Such creation errors do not reflect on the standard of medical care provided.   Shanon Ace, LCSW

## 2022-04-08 ENCOUNTER — Other Ambulatory Visit: Payer: Self-pay | Admitting: Internal Medicine

## 2022-04-08 DIAGNOSIS — Z8669 Personal history of other diseases of the nervous system and sense organs: Secondary | ICD-10-CM

## 2022-04-11 ENCOUNTER — Other Ambulatory Visit: Payer: Self-pay | Admitting: Psychiatry

## 2022-04-11 DIAGNOSIS — F419 Anxiety disorder, unspecified: Secondary | ICD-10-CM

## 2022-04-13 NOTE — Telephone Encounter (Signed)
Filled 6/28 appt next year

## 2022-04-14 ENCOUNTER — Telehealth: Payer: Self-pay | Admitting: Psychiatry

## 2022-04-14 ENCOUNTER — Other Ambulatory Visit: Payer: Self-pay

## 2022-04-14 DIAGNOSIS — F419 Anxiety disorder, unspecified: Secondary | ICD-10-CM

## 2022-04-14 NOTE — Telephone Encounter (Signed)
Pt requested refill of Lorazepam to [facility].  It is not available at [place].  Next appt 5/8

## 2022-04-14 NOTE — Telephone Encounter (Signed)
Pended.

## 2022-04-15 MED ORDER — LORAZEPAM 0.5 MG PO TABS: ORAL_TABLET | ORAL | 0 refills | Status: DC

## 2022-04-21 ENCOUNTER — Ambulatory Visit (INDEPENDENT_AMBULATORY_CARE_PROVIDER_SITE_OTHER): Payer: BC Managed Care – PPO | Admitting: Behavioral Health

## 2022-04-21 DIAGNOSIS — F4323 Adjustment disorder with mixed anxiety and depressed mood: Secondary | ICD-10-CM

## 2022-04-21 NOTE — Progress Notes (Signed)
                Mazey Mantell L Ashleyann Shoun, LMFT 

## 2022-04-21 NOTE — Progress Notes (Signed)
[facility] Counselor Initial Adult Exam  Name: Chelsea Dyer Date: 04/21/2022 MRN: [id number] DOB: April 14, 1966 PCP: Isaac Bliss, Rayford Halsted, MD  Time spent: 55 min  Guardian/Payee:  Self    Paperwork requested:  Any Legal ppw should be provided to Clinician as requested  Reason for Visit /Presenting Problem: Elevated anx/dep due to recent events w/her Husb's health status & Pt's inability to adjust to the new circumstances per her Husb's guardianship   Mental Status Exam: Appearance:   Neat     Behavior:  Appropriate & tearful  Motor:  Normal w/some restlessness   Speech/Language:   Normal Rate  Affect:  Depressed and Tearful  Mood:  anxious and sad  Thought process:  normal  Thought content:    WNL  Sensory/Perceptual disturbances:    WNL  Orientation:  oriented to person, place, and time/date  Attention:  Good  Concentration:  Good  Memory:  WNL  Fund of knowledge:   Good  Insight:    Good  Judgment:   Good  Impulse Control:  Good    Reported Symptoms:  Elevated anx/dep due to recent events w/her 56yo Husb w/whom she is sep'd & living in [place] home w/her 56yo Dtr Nicolette who has Dx of Down's Syndrome. Pt is tearful today since her emot'l upset has gotten worse since the [employer] searched her home 3 wks ago & she has been served w/a 50-B Order.  Risk Assessment: Danger to Self:  No Self-injurious Behavior: No Danger to Others: No Duty to Warn:no Physical Aggression / Violence:No  Access to Firearms a concern: No  Gang Involvement:No  Patient / guardian was educated about steps to take if suicide or homicide risk level increases between visits: Pt denies SI/SA & HI/HA @ this time. Provided resources as needed. While future psychiatric events cannot be accurately predicted, the patient does not currently require acute inpatient psychiatric care and does not currently meet [facility] involuntary commitment criteria.  Substance Abuse History: Current  substance abuse: No     Past Psychiatric History:   No previous psychological problems have been observed Outpatient Providers:  Dr. Trula Slade., MD History of Psych Hospitalization: No  Psychological Testing:  NA    Abuse History:  Victim of: No.,  NA    Report needed: No. Victim of Neglect:No. Perpetrator of  NA   Witness / Exposure to Domestic Violence: No   Protective Services Involvement: No  Witness to [employer] Violence:  No   Family History:  Family History  Problem Relation Age of Onset   Arthritis Mother        osteo   Allergic rhinitis Mother    Depression Mother    Down syndrome Daughter    Breast cancer Neg Hx     Living situation: the patient lives with their daughter 31yo Nicolette  Sexual Orientation: Straight  Relationship Status: separated  Name of spouse / other: Donneisha Beane [phone]; married for 13 yrs who is Dx'd w/Parkinson's & Dementia If a parent, number of children / ages:25yo Dtr [place]: Dtr Tax inspector in [place]:   Not noted today except the cost of Atty Jeanie Cooks, JD is fairly prohibitive  Income/Employment/Disability: Employment  Armed forces logistics/support/administrative officer: No   Educational History: Education: some college  Religion/Sprituality/World View: "I am a good Christian girl, I would never hurt anyone."  Any cultural differences that may affect / interfere with treatment:  none noted today  Recreation/Hobbies: unk  Stressors:  Legal issue    Strengths: Family and Able to Communicate Effectively  Barriers:  Pt is in state of disbelief about current circumstances. It was positive for Pt to hear statements of situational reality to enhance her sense of acceptance of feeling like she is in an impossible situation that has no end. Pt manifests a certain degree of naivite re: the marital discord, her Husb's Hx of lying, & her inability to impact his healthcare any longer.    Legal History: Pending legal issue / charges:   Pt has been served a 50-B & needs to read the specific order itself to understand her limitations for being near him. Directed Pt to read & be familiar w/this document to prevent further legal issues. History of legal issue / charges:  no other legal issues noted today  Medical History/Surgical History: reviewed Past Medical History:  Diagnosis Date   ALLERGIC RHINITIS 03/04/2008   Anxiety    DEPRESSION 03/04/2008   Edema    pedal   EXOGENOUS OBESITY 06/22/2010   PITUITARY MICROADENOMA 10/07/2009   Prolactinoma ([place])     Past Surgical History:  Procedure Laterality Date   BREAST SURGERY  2004   augmentation   HAMMER TOE SURGERY     LIPOSUCTION     NASAL SINUS SURGERY      Medications: Current Outpatient Medications  Medication Sig Dispense Refill   atorvastatin (LIPITOR) 40 MG tablet TAKE 1 TABLET BY MOUTH EVERY DAY (Patient not taking: Reported on 01/20/2022) 90 tablet 1   busPIRone (BUSPAR) 15 MG tablet Take 1 tablet (15 mg total) by mouth 2 (two) times daily. 180 tablet 3   [employer] B-12 500 MCG tablet TAKE 500 MCG BY MOUTH DAILY. 90 tablet 1   escitalopram (LEXAPRO) 10 MG tablet Take 1 tablet (10 mg total) by mouth daily. 90 tablet 3   ferrous sulfate 325 (65 FE) MG tablet Take 325 mg by mouth daily with breakfast.     fluticasone (FLONASE) 50 MCG/ACT nasal spray Place 2 sprays into both nostrils daily. 18.2 g 2   levocetirizine (XYZAL) 5 MG tablet Take 1 tablet (5 mg total) by mouth every evening. 90 tablet 1   LORazepam (ATIVAN) 0.5 MG tablet TAKE 1 TABLET BY MOUTH TWICE A DAY AS NEEDED FOR ANXIETY 60 tablet 0   meloxicam (MOBIC) 15 MG tablet Take 15 mg by mouth daily.     Multiple Vitamin (MULTI-VITAMINS) TABS Take by mouth.     Omega-3 Fatty Acids (FISH OIL) 1000 MG CAPS Take by mouth.     Pyridoxine HCl (VITAMIN B-6 PO) Take by mouth daily.     rizatriptan (MAXALT) 10 MG tablet TAKE ONE TABLET BY MOUTH AT ONSET OF HEADACHE MAY REPEAT ONE TABLET IN 2 HOURS IF NEEDED. 9 tablet 0    TURMERIC PO Take 2 tablets by mouth daily.     venlafaxine XR (EFFEXOR-XR) 150 MG 24 hr capsule Take 2 capsules (300 mg total) by mouth daily. 180 capsule 1   Vitamin D, Ergocalciferol, (DRISDOL) 1.25 MG (50000 UNIT) CAPS capsule TAKE 1 CAPSULE (50,000 UNITS TOTAL) BY MOUTH EVERY 7 (SEVEN) DAYS FOR 12 DOSES. 12 capsule 0   zolmitriptan (ZOMIG) 5 MG tablet TAKE 1 TABLET BY MOUTH FOR MIGRAINE HEADACHE. MAY REPEAT IN 2 HOURS IF NEEDED. NO MORE THAN 2 DOSES IN 24-HOUR PERIOD. 5 tablet 1   No current facility-administered medications for this visit.    Allergies  Allergen Reactions   Percocet [Oxycodone-Acetaminophen] Other (See Comments)  Interfered with patient's antidepressants; made pt feel dizzy   Zinc    Septra [Sulfamethoxazole-Trimethoprim] Rash    Diagnoses:  Adjustment d/o with mixed anxiety & dep'd mood  Plan of Care: Assist Pt by supporting her efforts as a Single Parent to provide for her Dtr & encourage her independence. Dtr has Down's Syndrome & is high functioning; she works @ [employer].  Provide encouragement for Pt to take a proactive approach to her legal issues & w/interactions from [place].    Donnetta Hutching, LMFT

## 2022-04-22 ENCOUNTER — Ambulatory Visit (INDEPENDENT_AMBULATORY_CARE_PROVIDER_SITE_OTHER): Payer: BC Managed Care – PPO

## 2022-04-22 DIAGNOSIS — J309 Allergic rhinitis, unspecified: Secondary | ICD-10-CM | POA: Diagnosis not present

## 2022-04-27 IMAGING — MG DIGITAL SCREENING BREAST BILAT IMPLANT W/ TOMO W/ CAD
8 of 12 series · 8 of 28 positions shown · non-contrast
Comparison: Previous exam(s).

CLINICAL DATA: Screening.

EXAM:
DIGITAL SCREENING BILATERAL MAMMOGRAM WITH IMPLANTS, CAD AND
TOMOSYNTHESIS
TECHNIQUE: Bilateral screening digital craniocaudal and mediolateral oblique
mammograms were obtained. Bilateral screening digital breast
tomosynthesis was performed. The images were evaluated with
computer-aided detection. Standard and/or implant displaced views
were performed.

[R MLO]
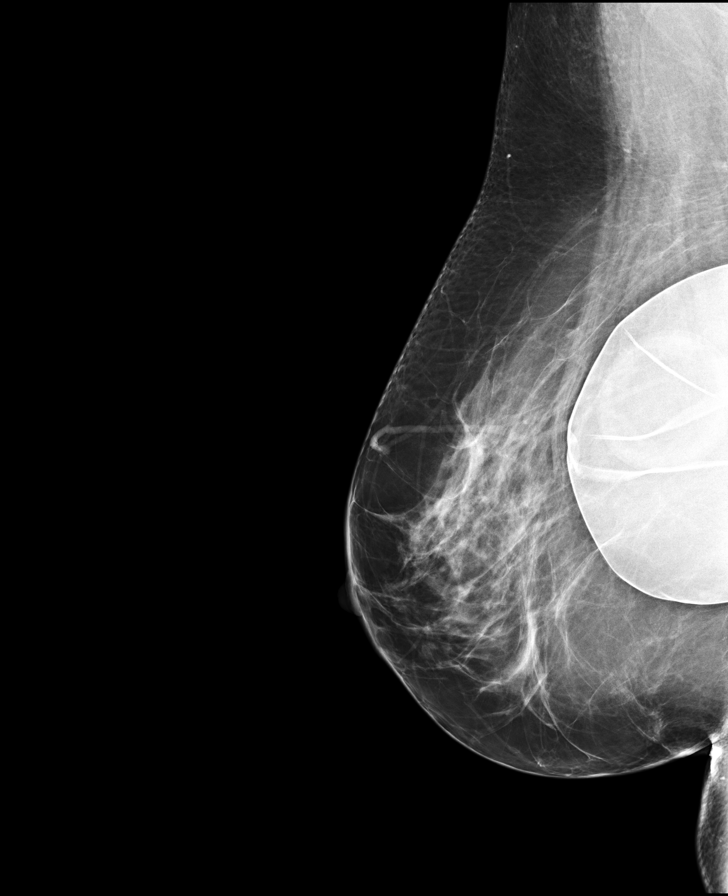

[L MLO]
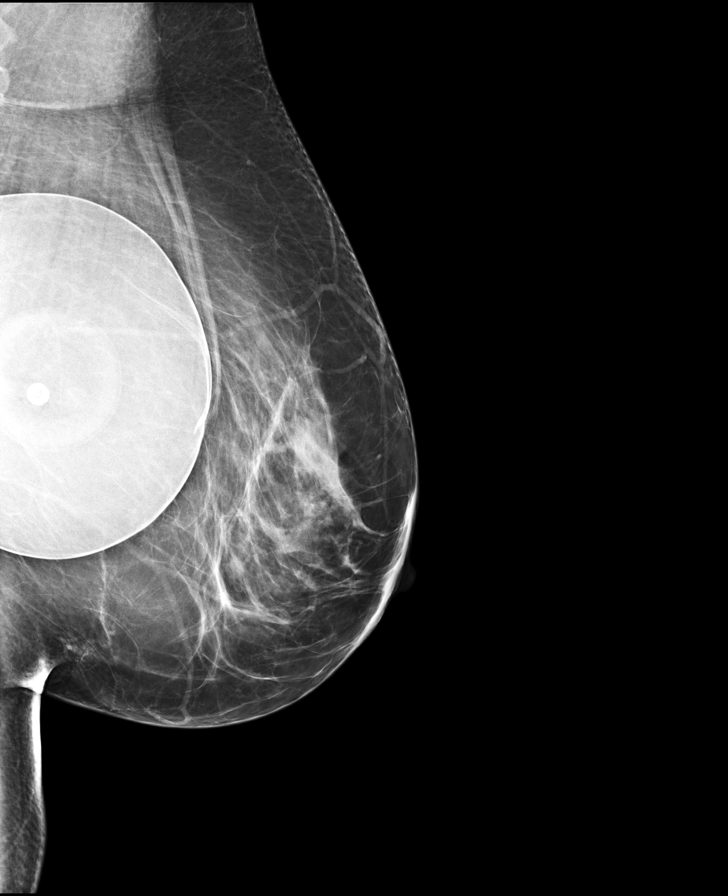

[R CC]
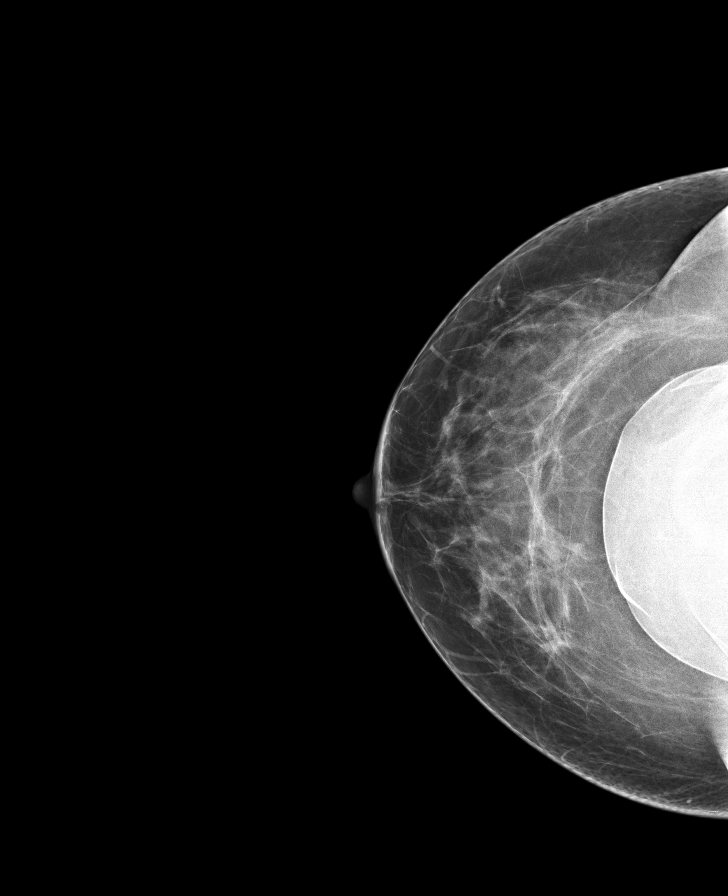

[L CC]
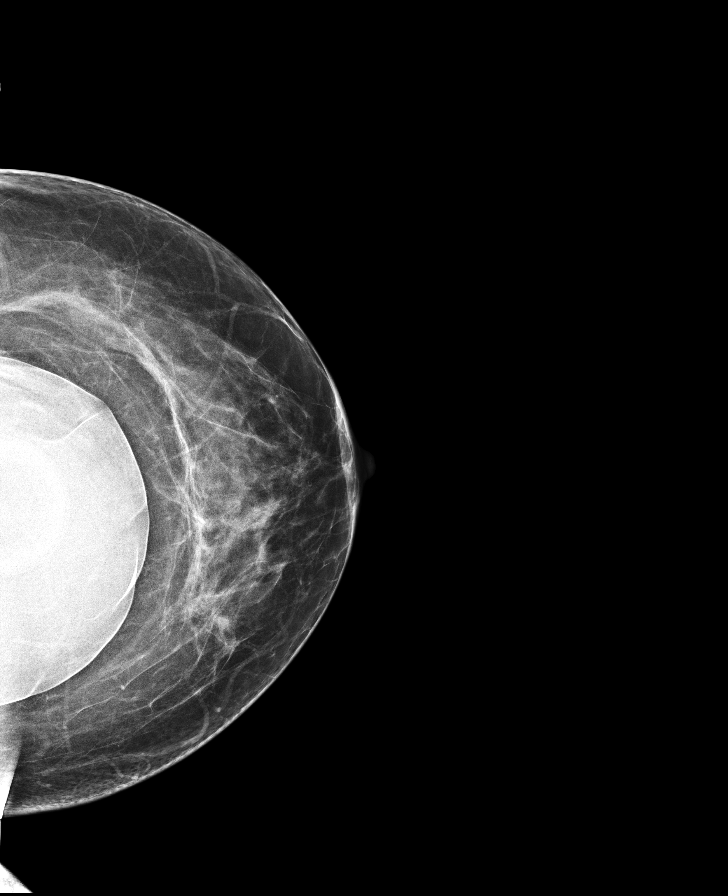

[R CC synth-2D]
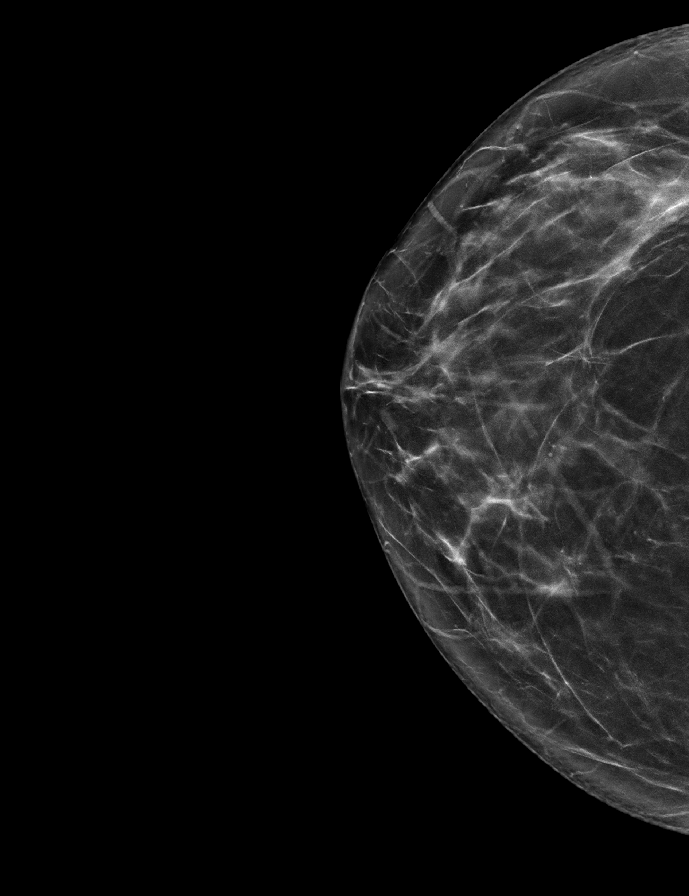

[L MLO synth-2D]
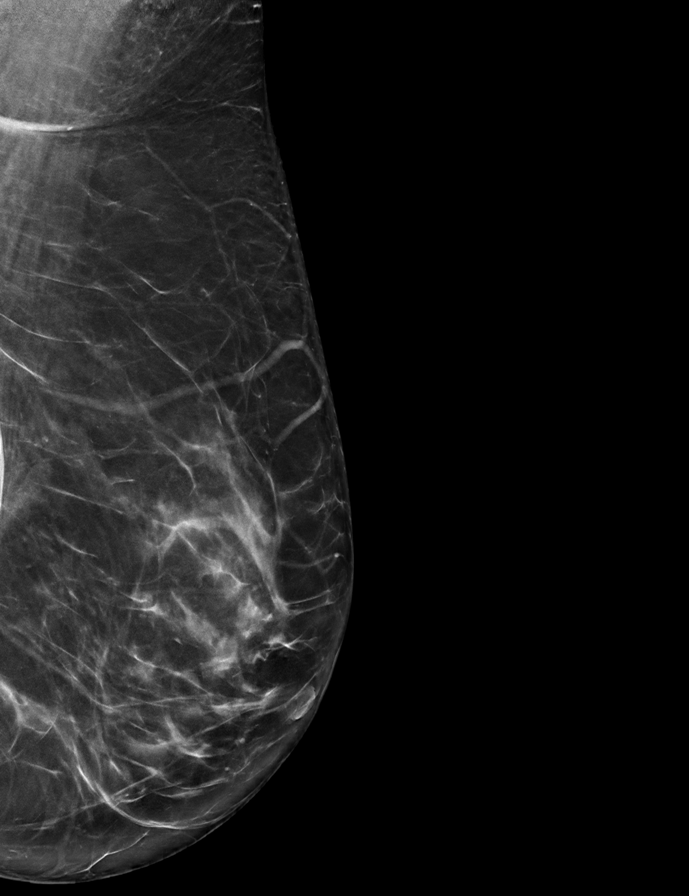

[R MLO synth-2D]
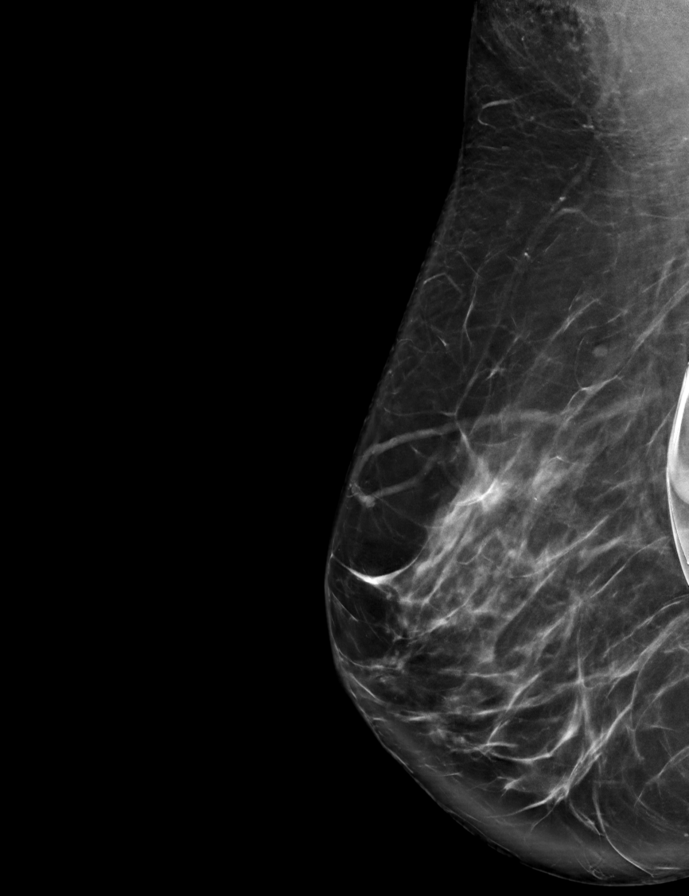

[L CC synth-2D]
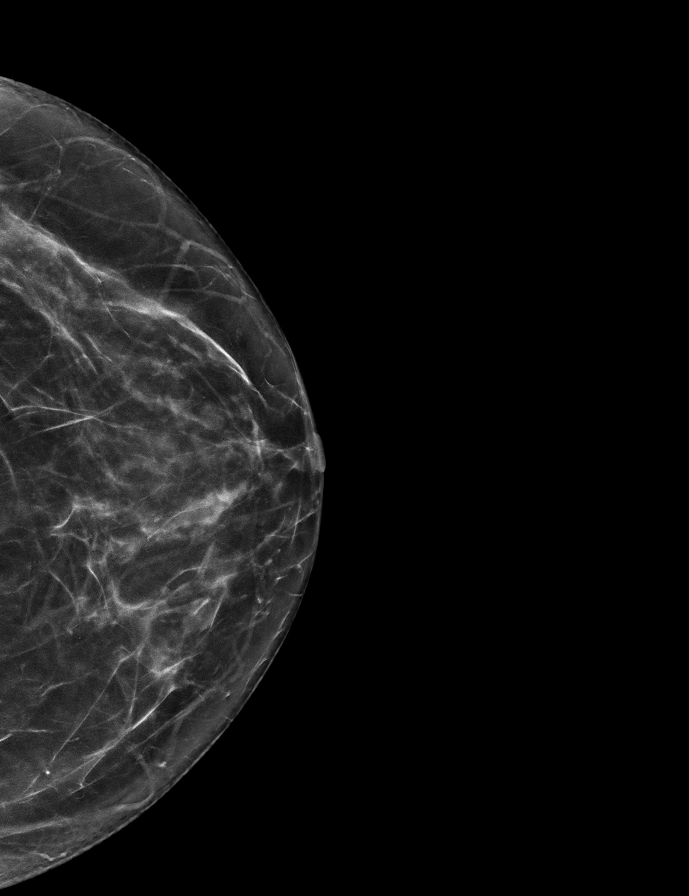

[8 of 28 positions shown; findings below may reference images not displayed]

ACR Breast Density Category c: The breast tissue is heterogeneously
dense, which may obscure small masses.
FINDINGS: The patient has retropectoral saline implants. There are no findings
suspicious for malignancy.
IMPRESSION: No mammographic evidence of malignancy. A result letter of this
screening mammogram will be mailed directly to the patient.

RECOMMENDATION:
Screening mammogram in one year. (Code:FQ-5-EOL)

BI-RADS CATEGORY  1:  Negative.

## 2022-05-05 ENCOUNTER — Ambulatory Visit: Payer: BC Managed Care – PPO | Admitting: Behavioral Health

## 2022-05-05 DIAGNOSIS — M545 Low back pain, unspecified: Secondary | ICD-10-CM | POA: Diagnosis not present

## 2022-05-05 DIAGNOSIS — M546 Pain in thoracic spine: Secondary | ICD-10-CM | POA: Diagnosis not present

## 2022-05-05 DIAGNOSIS — M9903 Segmental and somatic dysfunction of lumbar region: Secondary | ICD-10-CM | POA: Diagnosis not present

## 2022-05-05 DIAGNOSIS — M9904 Segmental and somatic dysfunction of sacral region: Secondary | ICD-10-CM | POA: Diagnosis not present

## 2022-05-08 ENCOUNTER — Other Ambulatory Visit: Payer: Self-pay | Admitting: Internal Medicine

## 2022-05-08 DIAGNOSIS — Z8669 Personal history of other diseases of the nervous system and sense organs: Secondary | ICD-10-CM

## 2022-05-12 ENCOUNTER — Other Ambulatory Visit: Payer: Self-pay | Admitting: Internal Medicine

## 2022-05-12 DIAGNOSIS — Z8669 Personal history of other diseases of the nervous system and sense organs: Secondary | ICD-10-CM

## 2022-05-19 ENCOUNTER — Ambulatory Visit: Payer: BC Managed Care – PPO | Admitting: Behavioral Health

## 2022-05-19 ENCOUNTER — Ambulatory Visit (INDEPENDENT_AMBULATORY_CARE_PROVIDER_SITE_OTHER): Payer: BC Managed Care – PPO | Admitting: *Deleted

## 2022-05-19 DIAGNOSIS — M9904 Segmental and somatic dysfunction of sacral region: Secondary | ICD-10-CM | POA: Diagnosis not present

## 2022-05-19 DIAGNOSIS — J309 Allergic rhinitis, unspecified: Secondary | ICD-10-CM | POA: Diagnosis not present

## 2022-05-19 DIAGNOSIS — M25551 Pain in right hip: Secondary | ICD-10-CM | POA: Diagnosis not present

## 2022-05-19 DIAGNOSIS — M545 Low back pain, unspecified: Secondary | ICD-10-CM | POA: Diagnosis not present

## 2022-05-19 DIAGNOSIS — M25552 Pain in left hip: Secondary | ICD-10-CM | POA: Diagnosis not present

## 2022-06-01 ENCOUNTER — Other Ambulatory Visit: Payer: Self-pay | Admitting: Internal Medicine

## 2022-06-01 DIAGNOSIS — Z8669 Personal history of other diseases of the nervous system and sense organs: Secondary | ICD-10-CM

## 2022-06-12 ENCOUNTER — Other Ambulatory Visit: Payer: Self-pay | Admitting: Internal Medicine

## 2022-06-12 DIAGNOSIS — E559 Vitamin D deficiency, unspecified: Secondary | ICD-10-CM

## 2022-06-23 DIAGNOSIS — M9904 Segmental and somatic dysfunction of sacral region: Secondary | ICD-10-CM | POA: Diagnosis not present

## 2022-06-23 DIAGNOSIS — M9905 Segmental and somatic dysfunction of pelvic region: Secondary | ICD-10-CM | POA: Diagnosis not present

## 2022-06-23 DIAGNOSIS — M545 Low back pain, unspecified: Secondary | ICD-10-CM | POA: Diagnosis not present

## 2022-06-23 DIAGNOSIS — M9903 Segmental and somatic dysfunction of lumbar region: Secondary | ICD-10-CM | POA: Diagnosis not present

## 2022-06-30 ENCOUNTER — Ambulatory Visit (INDEPENDENT_AMBULATORY_CARE_PROVIDER_SITE_OTHER): Payer: BC Managed Care – PPO | Admitting: *Deleted

## 2022-06-30 DIAGNOSIS — J309 Allergic rhinitis, unspecified: Secondary | ICD-10-CM

## 2022-07-05 ENCOUNTER — Other Ambulatory Visit: Payer: Self-pay | Admitting: Internal Medicine

## 2022-07-05 DIAGNOSIS — Z1231 Encounter for screening mammogram for malignant neoplasm of breast: Secondary | ICD-10-CM

## 2022-07-07 ENCOUNTER — Ambulatory Visit (INDEPENDENT_AMBULATORY_CARE_PROVIDER_SITE_OTHER): Payer: BC Managed Care – PPO | Admitting: *Deleted

## 2022-07-07 DIAGNOSIS — J309 Allergic rhinitis, unspecified: Secondary | ICD-10-CM | POA: Diagnosis not present

## 2022-07-10 ENCOUNTER — Other Ambulatory Visit: Payer: Self-pay | Admitting: Internal Medicine

## 2022-07-10 DIAGNOSIS — Z8669 Personal history of other diseases of the nervous system and sense organs: Secondary | ICD-10-CM

## 2022-07-15 ENCOUNTER — Ambulatory Visit (INDEPENDENT_AMBULATORY_CARE_PROVIDER_SITE_OTHER): Payer: BC Managed Care – PPO

## 2022-07-15 DIAGNOSIS — J309 Allergic rhinitis, unspecified: Secondary | ICD-10-CM

## 2022-07-20 ENCOUNTER — Ambulatory Visit (INDEPENDENT_AMBULATORY_CARE_PROVIDER_SITE_OTHER): Payer: BC Managed Care – PPO

## 2022-07-20 DIAGNOSIS — J309 Allergic rhinitis, unspecified: Secondary | ICD-10-CM

## 2022-07-20 DIAGNOSIS — M542 Cervicalgia: Secondary | ICD-10-CM | POA: Diagnosis not present

## 2022-07-20 DIAGNOSIS — M9901 Segmental and somatic dysfunction of cervical region: Secondary | ICD-10-CM | POA: Diagnosis not present

## 2022-07-20 DIAGNOSIS — M9902 Segmental and somatic dysfunction of thoracic region: Secondary | ICD-10-CM | POA: Diagnosis not present

## 2022-07-20 DIAGNOSIS — M546 Pain in thoracic spine: Secondary | ICD-10-CM | POA: Diagnosis not present

## 2022-07-21 ENCOUNTER — Other Ambulatory Visit: Payer: Self-pay | Admitting: Internal Medicine

## 2022-07-21 DIAGNOSIS — Z8669 Personal history of other diseases of the nervous system and sense organs: Secondary | ICD-10-CM

## 2022-07-23 ENCOUNTER — Telehealth: Payer: Self-pay | Admitting: Internal Medicine

## 2022-07-23 NOTE — Telephone Encounter (Signed)
Pt requesting refill of rizatriptan (MAXALT) 10 MG tablet  has been having more frequent headaches due to [place] [id number] [place], [place] Phone: [phone]  Fax: [phone]

## 2022-07-26 ENCOUNTER — Other Ambulatory Visit: Payer: Self-pay | Admitting: Psychiatry

## 2022-07-26 ENCOUNTER — Other Ambulatory Visit: Payer: Self-pay | Admitting: Internal Medicine

## 2022-07-26 DIAGNOSIS — Z8669 Personal history of other diseases of the nervous system and sense organs: Secondary | ICD-10-CM

## 2022-07-26 DIAGNOSIS — F419 Anxiety disorder, unspecified: Secondary | ICD-10-CM

## 2022-07-26 MED ORDER — RIZATRIPTAN BENZOATE 10 MG PO TABS: ORAL_TABLET | ORAL | 0 refills | Status: DC

## 2022-07-28 ENCOUNTER — Ambulatory Visit (INDEPENDENT_AMBULATORY_CARE_PROVIDER_SITE_OTHER): Payer: BC Managed Care – PPO | Admitting: *Deleted

## 2022-07-28 DIAGNOSIS — J309 Allergic rhinitis, unspecified: Secondary | ICD-10-CM | POA: Diagnosis not present

## 2022-08-11 DIAGNOSIS — M545 Low back pain, unspecified: Secondary | ICD-10-CM | POA: Diagnosis not present

## 2022-08-11 DIAGNOSIS — M25551 Pain in right hip: Secondary | ICD-10-CM | POA: Diagnosis not present

## 2022-08-11 DIAGNOSIS — M25552 Pain in left hip: Secondary | ICD-10-CM | POA: Diagnosis not present

## 2022-08-11 DIAGNOSIS — M9903 Segmental and somatic dysfunction of lumbar region: Secondary | ICD-10-CM | POA: Diagnosis not present

## 2022-08-24 ENCOUNTER — Other Ambulatory Visit: Payer: Self-pay | Admitting: Psychiatry

## 2022-08-24 ENCOUNTER — Other Ambulatory Visit: Payer: Self-pay | Admitting: Internal Medicine

## 2022-08-24 DIAGNOSIS — F419 Anxiety disorder, unspecified: Secondary | ICD-10-CM

## 2022-08-24 DIAGNOSIS — Z8669 Personal history of other diseases of the nervous system and sense organs: Secondary | ICD-10-CM

## 2022-08-25 ENCOUNTER — Ambulatory Visit: Payer: BC Managed Care – PPO

## 2022-08-25 NOTE — Telephone Encounter (Signed)
Filled 11/13 appt 5/8

## 2022-08-26 ENCOUNTER — Ambulatory Visit (INDEPENDENT_AMBULATORY_CARE_PROVIDER_SITE_OTHER): Payer: BC Managed Care – PPO

## 2022-08-26 DIAGNOSIS — J309 Allergic rhinitis, unspecified: Secondary | ICD-10-CM

## 2022-09-01 ENCOUNTER — Ambulatory Visit: Payer: BC Managed Care – PPO

## 2022-09-22 ENCOUNTER — Ambulatory Visit (INDEPENDENT_AMBULATORY_CARE_PROVIDER_SITE_OTHER): Payer: BC Managed Care – PPO

## 2022-09-22 DIAGNOSIS — J309 Allergic rhinitis, unspecified: Secondary | ICD-10-CM | POA: Diagnosis not present

## 2022-09-28 ENCOUNTER — Other Ambulatory Visit: Payer: Self-pay | Admitting: Internal Medicine

## 2022-09-28 DIAGNOSIS — Z8669 Personal history of other diseases of the nervous system and sense organs: Secondary | ICD-10-CM

## 2022-10-05 ENCOUNTER — Other Ambulatory Visit: Payer: Self-pay | Admitting: Psychiatry

## 2022-10-05 DIAGNOSIS — F339 Major depressive disorder, recurrent, unspecified: Secondary | ICD-10-CM

## 2022-10-15 ENCOUNTER — Other Ambulatory Visit: Payer: Self-pay | Admitting: Internal Medicine

## 2022-10-15 DIAGNOSIS — Z8669 Personal history of other diseases of the nervous system and sense organs: Secondary | ICD-10-CM

## 2022-10-18 ENCOUNTER — Other Ambulatory Visit: Payer: Self-pay | Admitting: Psychiatry

## 2022-10-19 NOTE — Telephone Encounter (Signed)
Ok to send

## 2022-10-27 ENCOUNTER — Ambulatory Visit
Admission: RE | Admit: 2022-10-27 | Discharge: 2022-10-27 | Disposition: A | Discharge status: Home / Self Care | Payer: BC Managed Care – PPO | Source: Ambulatory Visit | Attending: Internal Medicine | Admitting: Internal Medicine

## 2022-10-27 DIAGNOSIS — Z1231 Encounter for screening mammogram for malignant neoplasm of breast: Secondary | ICD-10-CM

## 2022-10-31 ENCOUNTER — Other Ambulatory Visit: Payer: Self-pay | Admitting: Psychiatry

## 2022-10-31 ENCOUNTER — Other Ambulatory Visit: Payer: Self-pay | Admitting: Internal Medicine

## 2022-10-31 DIAGNOSIS — Z8669 Personal history of other diseases of the nervous system and sense organs: Secondary | ICD-10-CM

## 2022-11-01 NOTE — Telephone Encounter (Signed)
Pt called to request a refill of the same 9 pills for the rizatriptan (MAXALT) 10 MG tablet  sent to   [facility] [id number], [place] [address] Phone: [phone]  Fax: [phone]     For her migraine headaches.  LOV:  07/08/21  Please advise.

## 2022-11-10 ENCOUNTER — Ambulatory Visit (INDEPENDENT_AMBULATORY_CARE_PROVIDER_SITE_OTHER): Payer: BC Managed Care – PPO

## 2022-11-10 DIAGNOSIS — J309 Allergic rhinitis, unspecified: Secondary | ICD-10-CM | POA: Diagnosis not present

## 2022-11-18 ENCOUNTER — Other Ambulatory Visit: Payer: Self-pay | Admitting: Internal Medicine

## 2022-11-18 DIAGNOSIS — Z8669 Personal history of other diseases of the nervous system and sense organs: Secondary | ICD-10-CM

## 2022-11-24 ENCOUNTER — Ambulatory Visit (INDEPENDENT_AMBULATORY_CARE_PROVIDER_SITE_OTHER): Payer: BC Managed Care – PPO

## 2022-11-24 DIAGNOSIS — J309 Allergic rhinitis, unspecified: Secondary | ICD-10-CM

## 2022-12-01 ENCOUNTER — Ambulatory Visit (INDEPENDENT_AMBULATORY_CARE_PROVIDER_SITE_OTHER): Payer: BC Managed Care – PPO

## 2022-12-01 DIAGNOSIS — J309 Allergic rhinitis, unspecified: Secondary | ICD-10-CM | POA: Diagnosis not present

## 2022-12-07 ENCOUNTER — Ambulatory Visit (INDEPENDENT_AMBULATORY_CARE_PROVIDER_SITE_OTHER): Payer: BC Managed Care – PPO

## 2022-12-07 DIAGNOSIS — J309 Allergic rhinitis, unspecified: Secondary | ICD-10-CM | POA: Diagnosis not present

## 2022-12-13 ENCOUNTER — Telehealth: Payer: Self-pay | Admitting: Internal Medicine

## 2022-12-13 NOTE — Telephone Encounter (Signed)
Prescription Request  12/13/2022  LOV: Visit date not found  What is the name of the medication or equipment? rizatriptan (MAXALT) 10 MG tablet . Pt has an appt on 12-29-2022  Have you contacted your pharmacy to request a refill? No   Which pharmacy would you like this sent to?   [facility] [id number] [place], [name] [place] [address] Phone: [phone] Fax: [phone]   Patient notified that their request is being sent to the clinical staff for review and that they should receive a response within 2 business days.   Please advise at Mobile [phone] (mobile)

## 2022-12-14 ENCOUNTER — Telehealth: Payer: Self-pay | Admitting: Internal Medicine

## 2022-12-14 ENCOUNTER — Ambulatory Visit (INDEPENDENT_AMBULATORY_CARE_PROVIDER_SITE_OTHER): Payer: BC Managed Care – PPO

## 2022-12-14 DIAGNOSIS — J309 Allergic rhinitis, unspecified: Secondary | ICD-10-CM | POA: Diagnosis not present

## 2022-12-14 DIAGNOSIS — Z8669 Personal history of other diseases of the nervous system and sense organs: Secondary | ICD-10-CM

## 2022-12-14 MED ORDER — RIZATRIPTAN BENZOATE 10 MG PO TABS: ORAL_TABLET | ORAL | 0 refills | Status: DC

## 2022-12-14 NOTE — Telephone Encounter (Signed)
Prescription Request  12/14/2022  LOV: Visit date not found  What is the name of the medication or equipment? Rizatripton (Maxalt) 10 MG tablet.  Have you contacted your pharmacy to request a refill? Yes   Which pharmacy would you like this sent to?  [facility] YE:[id number], [name] [place] [address] Phone: [phone] Fax: [phone]   Patient notified that their request is being sent to the clinical staff for review and that they should receive a response within 2 business days.   Please advise at Mobile [phone] (mobile)

## 2022-12-14 NOTE — Progress Notes (Signed)
VIALS EXP 12-14-23

## 2022-12-15 ENCOUNTER — Other Ambulatory Visit: Payer: Self-pay | Admitting: Internal Medicine

## 2022-12-15 DIAGNOSIS — J3089 Other allergic rhinitis: Secondary | ICD-10-CM | POA: Diagnosis not present

## 2022-12-15 DIAGNOSIS — Z8669 Personal history of other diseases of the nervous system and sense organs: Secondary | ICD-10-CM

## 2022-12-29 ENCOUNTER — Ambulatory Visit: Payer: BC Managed Care – PPO | Admitting: Internal Medicine

## 2022-12-29 ENCOUNTER — Encounter: Payer: Self-pay | Admitting: Internal Medicine

## 2022-12-29 VITALS — BP 124/84 | HR 79 | Temp 98.1°F | Wt 169.3 lb

## 2022-12-29 DIAGNOSIS — E782 Mixed hyperlipidemia: Secondary | ICD-10-CM | POA: Diagnosis not present

## 2022-12-29 DIAGNOSIS — J302 Other seasonal allergic rhinitis: Secondary | ICD-10-CM

## 2022-12-29 DIAGNOSIS — Z8669 Personal history of other diseases of the nervous system and sense organs: Secondary | ICD-10-CM

## 2022-12-29 DIAGNOSIS — Z114 Encounter for screening for human immunodeficiency virus [HIV]: Secondary | ICD-10-CM

## 2022-12-29 DIAGNOSIS — Z1159 Encounter for screening for other viral diseases: Secondary | ICD-10-CM | POA: Diagnosis not present

## 2022-12-29 DIAGNOSIS — Z124 Encounter for screening for malignant neoplasm of cervix: Secondary | ICD-10-CM

## 2022-12-29 DIAGNOSIS — E785 Hyperlipidemia, unspecified: Secondary | ICD-10-CM

## 2022-12-29 DIAGNOSIS — F339 Major depressive disorder, recurrent, unspecified: Secondary | ICD-10-CM | POA: Diagnosis not present

## 2022-12-29 DIAGNOSIS — F419 Anxiety disorder, unspecified: Secondary | ICD-10-CM

## 2022-12-29 DIAGNOSIS — Z1211 Encounter for screening for malignant neoplasm of colon: Secondary | ICD-10-CM

## 2022-12-29 DIAGNOSIS — E559 Vitamin D deficiency, unspecified: Secondary | ICD-10-CM

## 2022-12-29 DIAGNOSIS — J3089 Other allergic rhinitis: Secondary | ICD-10-CM

## 2022-12-29 LAB — CBC WITH DIFFERENTIAL/PLATELET
Basophils Absolute: 0 10*3/uL (ref 0.0–0.1)
Basophils Relative: 0.6 % (ref 0.0–3.0)
Eosinophils Absolute: 0.2 10*3/uL (ref 0.0–0.7)
Eosinophils Relative: 5.5 % — ABNORMAL HIGH (ref 0.0–5.0)
HCT: 44.2 % (ref 36.0–46.0)
Hemoglobin: 15.1 g/dL — ABNORMAL HIGH (ref 12.0–15.0)
Lymphocytes Relative: 30.6 % (ref 12.0–46.0)
Lymphs Abs: 1.3 10*3/uL (ref 0.7–4.0)
MCHC: 34.2 g/dL (ref 30.0–36.0)
MCV: 90.6 fl (ref 78.0–100.0)
Monocytes Absolute: 0.4 10*3/uL (ref 0.1–1.0)
Monocytes Relative: 10.1 % (ref 3.0–12.0)
Neutro Abs: 2.3 10*3/uL (ref 1.4–7.7)
Neutrophils Relative %: 53.2 % (ref 43.0–77.0)
Platelets: 246 10*3/uL (ref 150.0–400.0)
RBC: 4.88 Mil/uL (ref 3.87–5.11)
RDW: 13 % (ref 11.5–15.5)
WBC: 4.4 10*3/uL (ref 4.0–10.5)

## 2022-12-29 LAB — COMPREHENSIVE METABOLIC PANEL
ALT: 28 U/L (ref 0–35)
AST: 19 U/L (ref 0–37)
Albumin: 4.6 g/dL (ref 3.5–5.2)
Alkaline Phosphatase: 75 U/L (ref 39–117)
BUN: 16 mg/dL (ref 6–23)
CO2: 27 mEq/L (ref 19–32)
Calcium: 9.5 mg/dL (ref 8.4–10.5)
Chloride: 104 mEq/L (ref 96–112)
Creatinine, Ser: 0.61 mg/dL (ref 0.40–1.20)
GFR: 99.83 mL/min (ref >60.00)
Glucose, Bld: 75 mg/dL (ref 70–99)
Potassium: 4.1 mEq/L (ref 3.5–5.1)
Sodium: 140 mEq/L (ref 135–145)
Total Bilirubin: 0.4 mg/dL (ref 0.2–1.2)
Total Protein: 7.1 g/dL (ref 6.0–8.3)

## 2022-12-29 LAB — LIPID PANEL
Cholesterol: 173 mg/dL (ref 0–200)
HDL: 52.9 mg/dL (ref >39.00)
LDL Cholesterol: 100 mg/dL — ABNORMAL HIGH (ref 0–99)
NonHDL: 120
Total CHOL/HDL Ratio: 3
Triglycerides: 102 mg/dL (ref 0.0–149.0)
VLDL: 20.4 mg/dL (ref 0.0–40.0)

## 2022-12-29 LAB — VITAMIN B12: Vitamin B-12: >1500 pg/mL — ABNORMAL HIGH (ref 211–911)

## 2022-12-29 LAB — TSH: TSH: 1.22 u[IU]/mL (ref 0.35–5.50)

## 2022-12-29 LAB — HEMOGLOBIN A1C: Hgb A1c MFr Bld: 5.4 % (ref 4.6–6.5)

## 2022-12-29 LAB — VITAMIN D 25 HYDROXY (VIT D DEFICIENCY, FRACTURES): VITD: 67.67 ng/mL (ref 30.00–100.00)

## 2022-12-29 MED ORDER — ATORVASTATIN CALCIUM 40 MG PO TABS: 40.0000 mg | ORAL_TABLET | Freq: Every day | ORAL | 1 refills | Status: DC

## 2022-12-29 MED ORDER — ESCITALOPRAM OXALATE 10 MG PO TABS: 10.0000 mg | ORAL_TABLET | Freq: Every day | ORAL | 1 refills | Status: DC

## 2022-12-29 MED ORDER — VENLAFAXINE HCL ER 150 MG PO CP24: 300.0000 mg | ORAL_CAPSULE | Freq: Every day | ORAL | 1 refills | Status: DC

## 2022-12-29 MED ORDER — VITAMIN B-12 500 MCG PO TABS: 500.0000 ug | ORAL_TABLET | Freq: Every day | ORAL | 1 refills | Status: DC

## 2022-12-29 MED ORDER — ZOLMITRIPTAN 5 MG PO TABS: ORAL_TABLET | ORAL | 1 refills | Status: DC

## 2022-12-29 MED ORDER — RIZATRIPTAN BENZOATE 10 MG PO TABS: ORAL_TABLET | ORAL | 1 refills | Status: DC

## 2022-12-29 MED ORDER — BUSPIRONE HCL 15 MG PO TABS: 15.0000 mg | ORAL_TABLET | Freq: Two times a day (BID) | ORAL | 1 refills | Status: DC

## 2022-12-29 NOTE — Progress Notes (Signed)
Established Patient Office Visit     CC/Reason for Visit: Follow-up chronic medical conditions  HPI: Chelsea Dyer is a 57 y.o. female who is coming in today for the above mentioned reasons. Past Medical History is significant for: Depression, migraine headaches, hyperlipidemia, vitamin D deficiency.  She continues to have significant issues with depressed mood.  She has not seen her psychiatrist since May 2023.  She has never had a colonoscopy, is overdue for shingles vaccine, overdue for cervical cancer screening.   Past Medical/Surgical History: Past Medical History:  Diagnosis Date   ALLERGIC RHINITIS 03/04/2008   Anxiety    DEPRESSION 03/04/2008   Edema    pedal   EXOGENOUS OBESITY 06/22/2010   PITUITARY MICROADENOMA 10/07/2009   Prolactinoma     Past Surgical History:  Procedure Laterality Date   BREAST SURGERY  2004   augmentation   HAMMER TOE SURGERY     LIPOSUCTION     NASAL SINUS SURGERY      Social History:  reports that she has never smoked. She has never used smokeless tobacco. She reports that she does not drink alcohol and does not use drugs.  Allergies: Allergies  Allergen Reactions   Percocet [Oxycodone-Acetaminophen] Other (See Comments)    Interfered with patient's antidepressants; made pt feel dizzy   Zinc    Septra [Sulfamethoxazole-Trimethoprim] Rash    Family History:  Family History  Problem Relation Age of Onset   Arthritis Mother        osteo   Allergic rhinitis Mother    Depression Mother    Down syndrome Daughter    Breast cancer Neg Hx      Current Outpatient Medications:    atorvastatin (LIPITOR) 40 MG tablet, TAKE 1 TABLET BY MOUTH EVERY DAY, Disp: 90 tablet, Rfl: 1   busPIRone (BUSPAR) 15 MG tablet, Take 1 tablet (15 mg total) by mouth 2 (two) times daily., Disp: 180 tablet, Rfl: 3   escitalopram (LEXAPRO) 10 MG tablet, Take 1 tablet (10 mg total) by mouth daily., Disp: 90 tablet, Rfl: 3   ferrous sulfate 325 (65 FE) MG  tablet, Take 325 mg by mouth daily with breakfast., Disp: , Rfl:    fluticasone (FLONASE) 50 MCG/ACT nasal spray, Place 2 sprays into both nostrils daily., Disp: 18.2 g, Rfl: 2   levocetirizine (XYZAL) 5 MG tablet, Take 1 tablet (5 mg total) by mouth every evening., Disp: 90 tablet, Rfl: 1   LORazepam (ATIVAN) 0.5 MG tablet, TAKE ONE TABLET BY MOUTH TWICE A DAY AS NEEDED FOR ANXIETY, Disp: 60 tablet, Rfl: 1   meloxicam (MOBIC) 15 MG tablet, Take 15 mg by mouth daily., Disp: , Rfl:    Multiple Vitamin (MULTI-VITAMINS) TABS, Take by mouth., Disp: , Rfl:    Omega-3 Fatty Acids (FISH OIL) 1000 MG CAPS, Take by mouth., Disp: , Rfl:    Pyridoxine HCl (VITAMIN B-6 PO), Take by mouth daily., Disp: , Rfl:    rizatriptan (MAXALT) 10 MG tablet, May repeat in 2 hours if needed, Disp: 9 tablet, Rfl: 0   TURMERIC PO, Take 2 tablets by mouth daily., Disp: , Rfl:    venlafaxine XR (EFFEXOR-XR) 150 MG 24 hr capsule, TAKE 2 CAPSULES BY MOUTH DAILY, Disp: 180 capsule, Rfl: 0   vitamin B-12 (CYANOCOBALAMIN) 500 MCG tablet, TAKE 1 TABLET BY MOUTH DAILY, Disp: 90 tablet, Rfl: 1   zolmitriptan (ZOMIG) 5 MG tablet, TAKE 1 TABLET BY MOUTH FOR MIGRAINE HEADACHE. MAY REPEAT IN 2 HOURS IF  NEEDED. NO MORE THAN 2 DOSES IN 24-HOUR PERIOD., Disp: 5 tablet, Rfl: 1  Review of Systems:  Negative unless indicated in HPI.   Physical Exam: Vitals:   12/29/22 0829  BP: 124/84  Pulse: 79  Temp: 98.1 F (36.7 C)  TempSrc: Oral  SpO2: 98%  Weight: 169 lb 4.8 oz (76.8 kg)    Body mass index is 31.47 kg/m.   Physical Exam Vitals reviewed.  Constitutional:      Appearance: Normal appearance.  HENT:     Head: Normocephalic and atraumatic.  Eyes:     Conjunctiva/sclera: Conjunctivae normal.     Pupils: Pupils are equal, round, and reactive to light.  Cardiovascular:     Rate and Rhythm: Normal rate and regular rhythm.  Pulmonary:     Effort: Pulmonary effort is normal.     Breath sounds: Normal breath sounds.   Skin:    General: Skin is warm and dry.  Neurological:     General: No focal deficit present.     Mental Status: She is alert and oriented to person, place, and time.  Psychiatric:        Attention and Perception: Attention normal.        Mood and Affect: Mood is depressed. Affect is tearful.        Behavior: Behavior is slowed and withdrawn. Behavior is cooperative.        Thought Content: Thought content normal.        Judgment: Judgment normal.      Impression and Plan:  Depression, recurrent (HCC) - Plan: TSH, Vitamin B12, Vitamin B12, TSH  History of migraine headaches  Vitamin D deficiency - Plan: VITAMIN D 25 Hydroxy (Vit-D Deficiency, Fractures), VITAMIN D 25 Hydroxy (Vit-D Deficiency, Fractures)  Mixed hyperlipidemia - Plan: CBC with Differential/Platelet, Comprehensive metabolic panel, Hemoglobin A1c, Lipid panel, Lipid panel, Hemoglobin A1c, Comprehensive metabolic panel, CBC with Differential/Platelet  Seasonal and perennial allergic rhinitis  Encounter for hepatitis C screening test for low risk patient - Plan: Hepatitis C antibody, Hepatitis C antibody  Encounter for screening for HIV - Plan: HIV Antibody (routine testing w rflx), HIV Antibody (routine testing w rflx)  Screening for colon cancer - Plan: Ambulatory referral to Gastroenterology  Screening for cervical cancer - Plan: Ambulatory referral to Gynecology  Flowsheet Row Office Visit from 12/29/2022 in [facility]  PHQ-9 Total Score 13        -Check labs today. -Depression continues to be an issue, has not seen her psychiatrist since spring 2023, have advised that she call for recheck.  Medications will be refilled today.  She is on Effexor, buspirone and Lexapro. -Sent to GI for colon cancer screening.  GYN for cervical cancer screening. -Check lipids and send prescription for atorvastatin.  Time spent:33 minutes reviewing chart, interviewing and examining patient  and formulating plan of care.     Chaya Jan, MD [facility]

## 2022-12-29 NOTE — Addendum Note (Signed)
Addended by: Kern Reap B on: 12/29/2022 09:39 AM   Modules accepted: Orders

## 2022-12-30 ENCOUNTER — Encounter: Payer: Self-pay | Admitting: Internal Medicine

## 2022-12-30 LAB — HEPATITIS C ANTIBODY: Hepatitis C Ab: NONREACTIVE

## 2022-12-30 LAB — HIV ANTIBODY (ROUTINE TESTING W REFLEX): HIV 1&2 Ab, 4th Generation: NONREACTIVE

## 2023-01-05 ENCOUNTER — Ambulatory Visit: Payer: BC Managed Care – PPO | Admitting: Psychiatry

## 2023-01-12 DIAGNOSIS — R351 Nocturia: Secondary | ICD-10-CM | POA: Diagnosis not present

## 2023-01-12 DIAGNOSIS — R350 Frequency of micturition: Secondary | ICD-10-CM | POA: Diagnosis not present

## 2023-01-12 DIAGNOSIS — N393 Stress incontinence (female) (male): Secondary | ICD-10-CM | POA: Diagnosis not present

## 2023-01-19 ENCOUNTER — Ambulatory Visit: Payer: BC Managed Care – PPO | Admitting: Psychiatry

## 2023-01-19 ENCOUNTER — Encounter: Payer: Self-pay | Admitting: Psychiatry

## 2023-01-19 DIAGNOSIS — F419 Anxiety disorder, unspecified: Secondary | ICD-10-CM

## 2023-01-19 DIAGNOSIS — F339 Major depressive disorder, recurrent, unspecified: Secondary | ICD-10-CM | POA: Diagnosis not present

## 2023-01-19 MED ORDER — LORAZEPAM 0.5 MG PO TABS: ORAL_TABLET | ORAL | 5 refills | Status: AC

## 2023-01-19 NOTE — Patient Instructions (Addendum)
Possible therapy referrals:  -[facility] [phone]  -[facility] [phone]  [facility] [phone]

## 2023-01-19 NOTE — Progress Notes (Signed)
Chelsea Dyer [id number] 1966-07-19 57 y.o.  Subjective:   Patient ID:  Chelsea Dyer is a 57 y.o. (DOB 07/15/1966) female.  Chief Complaint:  Chief Complaint  Patient presents with   Depression   Anxiety    HPI [name] presents to the office today for follow-up of anxiety and depression. She reports, "I'm hanging in there." She reports that her mood "fluctuates... when a current situation arises." She reports that she has periods of depression for a few days with lower energy and motivation and then her mood improves. She reports that her anxiety has "been ok." Denies irritability. Sleeping ok. No change in appetite. Concentration is ok. Energy and motivation are "ok, not like the way it used to be." Denies SI.   She reports rarely needing Lorazepam. She reports that she is taking Buspar 30 mg in the morning since it causes insomnia if she takes it later in the day.   Working at [employer].   Past Psychiatric Medication Trials: Effexor XR- Has taken over 10 years. She reports that it has been helpful for mood and anxiety. Denies side effects.  Lexapro- Has taken for about a year. Has helped some for depression Prozac Wellbutrin-Ineffective Buspar- Insomnia with doses later in the day. Lorazepam- Helpful Valium May have taken Zoloft  GAD-7    Flowsheet Row Office Visit from 12/29/2022 in [facility] [place]  Total GAD-7 Score 17      PHQ2-9    Flowsheet Row Office Visit from 12/29/2022 in [facility] [place] Office Visit from 07/08/2021 in [facility] [place] Video Visit from 09/24/2020 in [facility] [place] Office Visit from 06/06/2020 in [facility] [place] Office Visit from 12/05/2019 in [facility] Total Score 6 0 1 0 4  PHQ-9 Total Score 13 0 5 0 9        Review of Systems:  Review of Systems   Gastrointestinal: Negative.   Musculoskeletal:  Negative for gait problem.  Allergic/Immunologic: Positive for environmental allergies.  Neurological:        Occasional migraines  Psychiatric/Behavioral:         Please refer to HPI    Medications: I have reviewed the patient's current medications.  Current Outpatient Medications  Medication Sig Dispense Refill   busPIRone (BUSPAR) 15 MG tablet Take 1 tablet (15 mg total) by mouth 2 (two) times daily. 180 tablet 1   cetirizine (ZYRTEC) 10 MG tablet Take 10 mg by mouth daily.     escitalopram (LEXAPRO) 10 MG tablet Take 1 tablet (10 mg total) by mouth daily. 90 tablet 1   ferrous sulfate 325 (65 FE) MG tablet Take 325 mg by mouth daily with breakfast.     fluticasone (FLONASE) 50 MCG/ACT nasal spray Place 2 sprays into both nostrils daily. 18.2 g 2   meloxicam (MOBIC) 15 MG tablet Take 15 mg by mouth daily. Taking prn     Multiple Vitamin (MULTI-VITAMINS) TABS Take by mouth.     Omega-3 Fatty Acids (FISH OIL) 1000 MG CAPS Take by mouth.     Pyridoxine HCl (VITAMIN B-6 PO) Take by mouth daily.     rizatriptan (MAXALT) 10 MG tablet May repeat in 2 hours if needed 9 tablet 1   venlafaxine XR (EFFEXOR-XR) 150 MG 24 hr capsule Take 2 capsules (300 mg total) by mouth daily. 180 capsule 1   vitamin B-12 (CYANOCOBALAMIN) 500  MCG tablet Take 1 tablet (500 mcg total) by mouth daily. 90 tablet 1   atorvastatin (LIPITOR) 40 MG tablet Take 1 tablet (40 mg total) by mouth daily. (Patient not taking: Reported on 01/19/2023) 90 tablet 1   levocetirizine (XYZAL) 5 MG tablet Take 1 tablet (5 mg total) by mouth every evening. (Patient not taking: Reported on 01/19/2023) 90 tablet 1   LORazepam (ATIVAN) 0.5 MG tablet TAKE ONE TABLET BY MOUTH TWICE A DAY AS NEEDED FOR ANXIETY 60 tablet 5   zolmitriptan (ZOMIG) 5 MG tablet TAKE 1 TABLET BY MOUTH FOR MIGRAINE HEADACHE. MAY REPEAT IN 2 HOURS IF NEEDED. NO MORE THAN 2 DOSES IN 24-HOUR PERIOD. (Patient not taking:  Reported on 01/19/2023) 5 tablet 1   No current facility-administered medications for this visit.    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   Please see review of systems for further details on the patient's review from today.   Objective:   Physical Exam:  LMP 08/24/2018   Physical Exam Constitutional:      General: She is not in acute distress. Musculoskeletal:        General: No deformity.  Neurological:     Mental Status: She is alert and oriented to person, place, and time.     Coordination: Coordination normal.  Psychiatric:        Attention and Perception: Attention and perception normal. She does not perceive auditory or visual hallucinations.        Mood and Affect: Mood is anxious. Mood is not depressed. Affect is not labile, blunt, angry or inappropriate.        Speech: Speech normal.        Thought Content: Thought content normal. Thought content is not paranoid or delusional. Thought content does not include homicidal or suicidal ideation. Thought content does not include homicidal or suicidal plan.        Cognition and Memory: Cognition and memory normal.        Judgment: Judgment normal.     Comments: Insight intact Behavior is somewhat guarded, ie. Does not elaborate on current stressors or provide details about symptoms     Lab Review:     Component Value Date/Time   NA 140 12/29/2022 0853   K 4.1 12/29/2022  0853   CL 104 12/29/2022 0853   CO2 27 12/29/2022 0853   GLUCOSE 75 12/29/2022 0853   BUN 16 12/29/2022 0853   CREATININE 0.61 12/29/2022 0853   CALCIUM 9.5 12/29/2022 0853   PROT 7.1 12/29/2022 0853   ALBUMIN 4.6 12/29/2022 0853   AST 19 12/29/2022 0853   ALT 28 12/29/2022 0853   ALKPHOS 75 12/29/2022 0853   BILITOT 0.4 12/29/2022 0853   GFRNONAA >60 02/01/2015 1440   GFRAA >60 02/01/2015 1440       Component Value Date/Time   WBC 4.4 12/29/2022 0853   RBC 4.88 12/29/2022 0853   HGB 15.1 (H) 12/29/2022 4098  HCT 44.2 12/29/2022 0853   PLT 246.0 12/29/2022 0853   MCV 90.6 12/29/2022 0853   MCH 29.1 02/01/2015 1440   MCHC 34.2 12/29/2022 0853   RDW 13.0 12/29/2022 0853   LYMPHSABS 1.3 12/29/2022 0853   MONOABS 0.4 12/29/2022 0853   EOSABS 0.2 12/29/2022 0853   BASOSABS 0.0 12/29/2022 0853    No results found for: "POCLITH", "LITHIUM"   No results found for: "PHENYTOIN", "PHENOBARB", "VALPROATE", "CBMZ"   .res Assessment: Plan:    Pt reports that she recently saw her PCP and her PCP refilled her medications. She reports that she and PCP discussed that patient is currently taking maximum approved amounts of medication. Discussed that Buspar is approved up to 60 mg daily in divided doses. Pt reports that she is currently taking Buspar 30 mg in the morning and is unable to take Buspar later in the day.  She reports that she does not want to change current medications and that she does not want to start any new medications at this time. She reports, "what I am on does help me." She reports, "I think right now is not the time to change anything."  Recommend continuing current medications without changes.  Pt reports that she thinks therapy would be most beneficial for her at this time. Provided pt with possible therapy referrals.  Pt reports that she prefers to discontinue treatment in this office and to have PCP to continue to prescribe her psychiatric medications.  Pt  requests refill on Lorazepam until she can follow-up with PCP in 6 months. Will refill Lorazepam 0.5 mg po BID prn anxiety.    Venis was seen today for depression and anxiety.  Diagnoses and all orders for this visit:  Anxiety -     LORazepam (ATIVAN) 0.5 MG tablet; TAKE ONE TABLET BY MOUTH TWICE A DAY AS NEEDED FOR ANXIETY  Depression, recurrent (HCC)     Please see After Visit Summary for patient specific instructions.  Future Appointments  Date Time Provider Department Center  02/02/2023 10:30 AM [facility] PREVISIT RM 52 [facility]  03/02/2023  8:00 AM Hilarie Fredrickson, MD [facility]  03/16/2023 11:30 AM Chrzanowski, Lamona Curl, NP [facility] None    No orders of the defined types were placed in this encounter.   -------------------------------

## 2023-01-22 DIAGNOSIS — J329 Chronic sinusitis, unspecified: Secondary | ICD-10-CM | POA: Diagnosis not present

## 2023-01-23 NOTE — Patient Instructions (Incomplete)
1. Allergic rhinitis - on allergen immunotherapy since 2017; controlled - Continue cetirizine 10 mg once a day as needed for a runny nose - Continue Flonase 1-2 sprays in each nostril once a day as needed for a stuffy nose.  - Continue with Pataday- one drop per eye daily as needed. - Continue with allergy shots at the same schedule  Follow-up in 1 year or sooner if needed

## 2023-01-24 ENCOUNTER — Ambulatory Visit: Payer: BC Managed Care – PPO | Admitting: Family

## 2023-01-24 ENCOUNTER — Encounter: Payer: Self-pay | Admitting: Family

## 2023-01-24 VITALS — BP 108/76 | HR 80 | Temp 98.0°F | Resp 18 | Ht 61.5 in | Wt 162.8 lb

## 2023-01-24 DIAGNOSIS — J302 Other seasonal allergic rhinitis: Secondary | ICD-10-CM

## 2023-01-24 DIAGNOSIS — J3089 Other allergic rhinitis: Secondary | ICD-10-CM | POA: Diagnosis not present

## 2023-01-24 DIAGNOSIS — H1013 Acute atopic conjunctivitis, bilateral: Secondary | ICD-10-CM | POA: Diagnosis not present

## 2023-01-24 MED ORDER — EPINEPHRINE 0.3 MG/0.3ML IJ SOAJ: 0.3000 mg | INTRAMUSCULAR | 1 refills | Status: DC | PRN

## 2023-01-24 NOTE — Progress Notes (Signed)
[address] [place] [address] Dept: [phone]  FOLLOW UP NOTE  Patient ID: Chelsea Dyer, female    DOB: March 22, 1966  Age: 57 y.o. MRN: [id number] Date of Office Visit: 01/24/2023  Assessment  Chief Complaint: Allergies (Everything is going good.)  HPI Chelsea Dyer is a 57 year old female who presents today for follow-up of allergic rhinitis-on allergy immunotherapy since 2017.  She was last seen on October 14, 2021 by Dr. Maurine Minister.  She denies any new diagnosis or surgery since her last office visit.  Seasonal and perennial allergic rhinitis: She is currently taking cetirizine 10 mg once a day, Flonase nasal spray daily, allergy injections per protocol, and Pataday eyedrops as needed.  She is also on amoxicillin that she started this past Saturday due to a sinus infection.  She reports that she gets 2 sinus infections a year.  Right now she has rhinorrhea and nasal congestion.  The rhinorrhea is yellow-green in color.  She also has postnasal drip that is causing her to cough.  She also has sneezing.  When she does not have a sinus infection she occasionally has rhinorrhea.  She denies any problems or reactions with her allergy injections and reports that they do help.  She wants to continue allergy injections.  She started allergy injections on March 2017 and reached maintenance in 2018.  She is weary of discontinuing allergy injections because she has already discontinued allergy injections once when she moved from [place] to [place] and had to resume.   Drug Allergies:  Allergies  Allergen Reactions   Percocet [Oxycodone-Acetaminophen] Other (See Comments)    Interfered with patient's antidepressants; made pt feel dizzy   Zinc    Septra [Sulfamethoxazole-Trimethoprim] Rash    Review of Systems: Review of Systems  Constitutional:  Negative for chills and fever.  HENT:         Reports rhinorrhea that is yellow/green in color and nasal congestion. Also reports post nasal  drip that causes her to cough  Eyes:        Reports itchy watery eyes right now  Respiratory:  Positive for cough. Negative for shortness of breath and wheezing.        Reports cough due to post nasal drip. Denies wheeze, tightness in chest, or shortness of breath  Cardiovascular:  Positive for chest pain. Negative for palpitations.       Reports chest pain after cough  Gastrointestinal:        Denies heartburn or reflux symptoms  Skin:  Negative for itching and rash.  Neurological:  Positive for headaches.  Endo/Heme/Allergies:  Positive for environmental allergies.     Physical Exam: BP 108/76 (BP Location: Left Arm, Patient Position: Sitting, Cuff Size: Normal)   Pulse 80   Temp 98 F (36.7 C) (Temporal)   Resp 18   Ht 5' 1.5" (1.562 m)   Wt 162 lb 12.8 oz (73.8 kg)   LMP 08/24/2018   SpO2 97%   BMI 30.26 kg/m    Physical Exam Constitutional:      Appearance: Normal appearance.  HENT:     Head: Normocephalic and atraumatic.     Comments: Pharynx normal. Eyes normal. Ears normal. Nose: bilateral lower turbinates mildly edematous with no drainage noted.    Right Ear: Tympanic membrane, ear canal and external ear normal.     Left Ear: Tympanic membrane, ear canal and external ear normal.     Mouth/Throat:     Mouth: Mucous membranes are moist.  Pharynx: Oropharynx is clear.  Eyes:     Conjunctiva/sclera: Conjunctivae normal.  Cardiovascular:     Rate and Rhythm: Regular rhythm.     Heart sounds: Normal heart sounds.  Pulmonary:     Effort: Pulmonary effort is normal.     Breath sounds: Normal breath sounds.     Comments: Lungs clear to auscultation Musculoskeletal:     Cervical back: Neck supple.  Skin:    General: Skin is warm.  Neurological:     Mental Status: She is alert and oriented to person, place, and time.  Psychiatric:        Mood and Affect: Mood normal.        Behavior: Behavior normal.        Thought Content: Thought content normal.         Judgment: Judgment normal.     Diagnostics:  None  Assessment and Plan: 1. Seasonal and perennial allergic rhinitis   2. Allergic conjunctivitis of both eyes     No orders of the defined types were placed in this encounter.   Patient Instructions  1. Allergic rhinitis - on allergen immunotherapy since 2017; controlled - Continue cetirizine 10 mg once a day as needed for a runny nose - Continue Flonase 1-2 sprays in each nostril once a day as needed for a stuffy nose.  - Continue with Pataday- one drop per eye daily as needed. - Continue with allergy shots at the same schedule and have access to your epinephrine auto injector device. Demonstration given on how to use Auvi-Q.  Follow-up in 1 year or sooner if needed      Return in about 1 year (around 01/24/2024), or if symptoms worsen or fail to improve.    Thank you for the opportunity to care for this patient.  Please do not hesitate to contact me with questions.  Nehemiah Settle, FNP [facility]

## 2023-02-01 ENCOUNTER — Ambulatory Visit (INDEPENDENT_AMBULATORY_CARE_PROVIDER_SITE_OTHER): Payer: BC Managed Care – PPO

## 2023-02-01 DIAGNOSIS — J309 Allergic rhinitis, unspecified: Secondary | ICD-10-CM | POA: Diagnosis not present

## 2023-02-02 ENCOUNTER — Ambulatory Visit (AMBULATORY_SURGERY_CENTER): Payer: BC Managed Care – PPO

## 2023-02-02 ENCOUNTER — Other Ambulatory Visit: Payer: Self-pay | Admitting: Internal Medicine

## 2023-02-02 ENCOUNTER — Encounter: Payer: Self-pay | Admitting: Internal Medicine

## 2023-02-02 VITALS — Ht 62.0 in | Wt 160.0 lb

## 2023-02-02 DIAGNOSIS — Z1211 Encounter for screening for malignant neoplasm of colon: Secondary | ICD-10-CM

## 2023-02-02 DIAGNOSIS — Z8669 Personal history of other diseases of the nervous system and sense organs: Secondary | ICD-10-CM

## 2023-02-02 MED ORDER — NA SULFATE-K SULFATE-MG SULF 17.5-3.13-1.6 GM/177ML PO SOLN: 1.0000 | Freq: Once | ORAL | 0 refills | Status: AC

## 2023-02-02 NOTE — Progress Notes (Signed)
Pre visit completed via phone call; Patient verified name, DOB, and address;  No egg or soy allergy known to patient;  No issues known to pt with past sedation with any surgeries or procedures; Patient denies ever being told they had issues or difficulty with intubation;  No FH of Malignant Hyperthermia; Pt is not on diet pills; Pt is not on home 02;  Pt is not on blood thinners;  Pt denies issues with constipation  No A fib or A flutter; Have any cardiac testing pending--NO Pt instructed to use Singlecare.com or GoodRx for a price reduction on prep;   Insurance verified during PV appt=[employer] Blue options  Patient's chart reviewed by Cathlyn Parsons CNRA prior to previsit and patient appropriate for the [facility].  Previsit completed and red dot placed by patient's name on their procedure day (on provider's schedule).    Instructions and GoodRx coupon printed and mailed the patient per her request;

## 2023-02-09 ENCOUNTER — Ambulatory Visit (INDEPENDENT_AMBULATORY_CARE_PROVIDER_SITE_OTHER): Payer: BC Managed Care – PPO | Admitting: *Deleted

## 2023-02-09 DIAGNOSIS — J309 Allergic rhinitis, unspecified: Secondary | ICD-10-CM | POA: Diagnosis not present

## 2023-02-24 ENCOUNTER — Ambulatory Visit (INDEPENDENT_AMBULATORY_CARE_PROVIDER_SITE_OTHER): Payer: BC Managed Care – PPO

## 2023-02-24 DIAGNOSIS — J309 Allergic rhinitis, unspecified: Secondary | ICD-10-CM

## 2023-03-02 ENCOUNTER — Encounter: Payer: Self-pay | Admitting: Internal Medicine

## 2023-03-02 ENCOUNTER — Ambulatory Visit (AMBULATORY_SURGERY_CENTER): Payer: BC Managed Care – PPO | Admitting: Internal Medicine

## 2023-03-02 VITALS — BP 111/72 | HR 75 | Temp 97.1°F | Resp 14 | Ht 62.0 in | Wt 160.0 lb

## 2023-03-02 DIAGNOSIS — K635 Polyp of colon: Secondary | ICD-10-CM | POA: Diagnosis not present

## 2023-03-02 DIAGNOSIS — D122 Benign neoplasm of ascending colon: Secondary | ICD-10-CM

## 2023-03-02 DIAGNOSIS — Z1211 Encounter for screening for malignant neoplasm of colon: Secondary | ICD-10-CM

## 2023-03-02 MED ORDER — SODIUM CHLORIDE 0.9 % IV SOLN: 500.0000 mL | Freq: Once | INTRAVENOUS | Status: DC

## 2023-03-02 NOTE — Progress Notes (Signed)
Vss nad trans to pacu 

## 2023-03-02 NOTE — Op Note (Signed)
[facility] Patient Name: Chelsea Dyer Procedure Date: 03/02/2023 8:05 AM MRN: [id number] Endoscopist: Wilhemina Bonito. Marina Goodell , MD, [id number] Age: 57 Referring MD:  Date of Birth: 04/04/1966 Gender: Female Account #: [id number] Procedure:                Colonoscopy with cold snare polypectomy x 1 Indications:              Screening for colorectal malignant neoplasm Medicines:                Monitored Anesthesia Care Procedure:                Pre-Anesthesia Assessment:                           - Prior to the procedure, a History and Physical                            was performed, and patient medications and                            allergies were reviewed. The patient's tolerance of                            previous anesthesia was also reviewed. The risks                            and benefits of the procedure and the sedation                            options and risks were discussed with the patient.                            All questions were answered, and informed consent                            was obtained. Prior Anticoagulants: The patient has                            taken no anticoagulant or antiplatelet agents. ASA                            Grade Assessment: II - A patient with mild systemic                            disease. After reviewing the risks and benefits,                            the patient was deemed in satisfactory condition to                            undergo the procedure.                           After obtaining informed consent, the colonoscope  was passed under direct vision. Throughout the                            procedure, the patient's blood pressure, pulse, and                            oxygen saturations were monitored continuously. The                            CF [id number] #[id number] was introduced through the anus                            and advanced to the the cecum, identified by                             appendiceal orifice and ileocecal valve. The                            ileocecal valve, appendiceal orifice, and rectum                            were photographed. The quality of the bowel                            preparation was excellent. The colonoscopy was                            performed without difficulty. The patient tolerated                            the procedure well. The bowel preparation used was                            SUPREP via split dose instruction. Scope In: 8:23:16 AM Scope Out: 8:38:52 AM Scope Withdrawal Time: 0 hours 10 minutes 51 seconds  Total Procedure Duration: 0 hours 15 minutes 36 seconds  Findings:                 A 1 mm polyp was found in the ascending colon. The                            polyp was removed with a cold snare. Resection and                            retrieval were complete.                           Multiple diverticula were found in the left colon.                           The exam was otherwise without abnormality on                            direct and retroflexion views. Complications:  No immediate complications. Estimated blood loss:                            None. Estimated Blood Loss:     Estimated blood loss: none. Impression:               - One 1 mm polyp in the ascending colon, removed                            with a cold snare. Resected and retrieved.                           - Diverticulosis in the left colon.                           - The examination was otherwise normal on direct                            and retroflexion views. Recommendation:           - Repeat colonoscopy in 7-10 years for surveillance.                           - Patient has a contact number available for                            emergencies. The signs and symptoms of potential                            delayed complications were discussed with the                            patient. Return to normal  activities tomorrow.                            Written discharge instructions were provided to the                            patient.                           - Resume previous diet.                           - Continue present medications.                           - Await pathology results. Wilhemina Bonito. Marina Goodell, MD 03/02/2023 8:59:07 AM This report has been signed electronically.

## 2023-03-02 NOTE — Progress Notes (Signed)
Pt's states no medical or surgical changes since previsit or office visit. VS assessed by Eric 

## 2023-03-02 NOTE — Progress Notes (Signed)
Called to room to assist during endoscopic procedure.  Patient ID and intended procedure confirmed with present staff. Received instructions for my participation in the procedure from the performing physician.  

## 2023-03-02 NOTE — Patient Instructions (Addendum)
Recommendation:- Repeat colonoscopy in 7-10 years for surveillance.                           - Patient has a contact number available for                            emergencies. The signs and symptoms of potential                            delayed complications were discussed with the                            patient. Return to normal activities tomorrow.                            Written discharge instructions were provided to the                            patient.                           - Resume previous diet.                           - Continue present medications.                           - Await pathology results.  Handouts on polyps and diverticulosis given.  YOU HAD AN ENDOSCOPIC PROCEDURE TODAY AT THE [facility]:   Refer to the procedure report that was given to you for any specific questions about what was found during the examination.  If the procedure report does not answer your questions, please call your gastroenterologist to clarify.  If you requested that your care partner not be given the details of your procedure findings, then the procedure report has been included in a sealed envelope for you to review at your convenience later.  YOU SHOULD EXPECT: Some feelings of bloating in the abdomen. Passage of more gas than usual.  Walking can help get rid of the air that was put into your GI tract during the procedure and reduce the bloating. If you had a lower endoscopy (such as a colonoscopy or flexible sigmoidoscopy) you may notice spotting of blood in your stool or on the toilet paper. If you underwent a bowel prep for your procedure, you may not have a normal bowel movement for a few days.  Please Note:  You might notice some irritation and congestion in your nose or some drainage.  This is from the oxygen used during your procedure.  There is no need for concern and it should clear up in a day or so.  SYMPTOMS TO REPORT IMMEDIATELY:  Following lower  endoscopy (colonoscopy or flexible sigmoidoscopy):  Excessive amounts of blood in the stool  Significant tenderness or worsening of abdominal pains  Swelling of the abdomen that is new, acute  Fever of 100F or higher  For urgent or emergent issues, a gastroenterologist can be reached at any hour by calling [phone]. Do not use MyChart messaging for urgent concerns.    DIET:  We do recommend a   small meal at first, but then you may proceed to your regular diet.  Drink plenty of fluids but you should avoid alcoholic beverages for 24 hours.  ACTIVITY:  You should plan to take it easy for the rest of today and you should NOT DRIVE or use heavy machinery until tomorrow (because of the sedation medicines used during the test).    FOLLOW UP: Our staff will call the number listed on your records the next business day following your procedure.  We will call around 7:15- 8:00 am to check on you and address any questions or concerns that you may have regarding the information given to you following your procedure. If we do not reach you, we will leave a message.     If any biopsies were taken you will be contacted by phone or by letter within the next 1-3 weeks.  Please call us at [phone] if you have not heard about the biopsies in 3 weeks.    SIGNATURES/CONFIDENTIALITY: You and/or your care partner have signed paperwork which will be entered into your electronic medical record.  These signatures attest to the fact that that the information above on your After Visit Summary has been reviewed and is understood.  Full responsibility of the confidentiality of this discharge information lies with you and/or your care-partner. 

## 2023-03-02 NOTE — Progress Notes (Signed)
HISTORY OF PRESENT ILLNESS:  Chelsea Dyer is a 57 y.o. female who presents today for routine screening colonoscopy.  No complaints  REVIEW OF SYSTEMS:  All non-GI ROS negative except for  Past Medical History:  Diagnosis Date   ALLERGIC RHINITIS 03/04/2008   Anxiety    on meds   DEPRESSION 03/04/2008   on meds   Edema    pedal   EXOGENOUS OBESITY 06/22/2010   PITUITARY MICROADENOMA 10/07/2009   Prolactinoma (HCC)    Seasonal allergies     Past Surgical History:  Procedure Laterality Date   BREAST SURGERY  2004   augmentation   HAMMER TOE SURGERY     LIPOSUCTION     NASAL SINUS SURGERY      Social History Chelsea Dyer  reports that she has never smoked. She has never used smokeless tobacco. She reports that she does not drink alcohol and does not use drugs.  family history includes Allergic rhinitis in her mother; Arthritis in her mother; Depression in her mother; Down syndrome in her daughter.  Allergies  Allergen Reactions   Percocet [Oxycodone-Acetaminophen] Other (See Comments)    Interfered with patient's antidepressants; made pt feel dizzy   Zinc    Septra [Sulfamethoxazole-Trimethoprim] Rash       PHYSICAL EXAMINATION: Vital signs: BP 107/64   Pulse 79   Temp (!) 97.1 F (36.2 C) (Skin)   Resp 17   Ht 5\' 2"  (1.575 m)   Wt 160 lb (72.6 kg)   LMP 08/24/2018   SpO2 94%   BMI 29.26 kg/m  General: Well-developed, well-nourished, no acute distress HEENT: Sclerae are anicteric, conjunctiva pink. Oral mucosa intact Lungs: Clear Heart: Regular Abdomen: soft, nontender, nondistended, no obvious ascites, no peritoneal signs, normal bowel sounds. No organomegaly. Extremities: No edema Psychiatric: alert and oriented x3. Cooperative     ASSESSMENT:  Colon cancer screening.  Average risk   PLAN:   Screening colonoscopy

## 2023-03-03 ENCOUNTER — Ambulatory Visit (INDEPENDENT_AMBULATORY_CARE_PROVIDER_SITE_OTHER): Payer: BC Managed Care – PPO

## 2023-03-03 ENCOUNTER — Telehealth: Payer: Self-pay | Admitting: *Deleted

## 2023-03-03 DIAGNOSIS — J309 Allergic rhinitis, unspecified: Secondary | ICD-10-CM | POA: Diagnosis not present

## 2023-03-03 NOTE — Telephone Encounter (Signed)
Post procedure follow up phone call. No answer at number given.  Left message on voicemail.  

## 2023-03-09 ENCOUNTER — Ambulatory Visit (INDEPENDENT_AMBULATORY_CARE_PROVIDER_SITE_OTHER): Payer: BC Managed Care – PPO

## 2023-03-09 DIAGNOSIS — J309 Allergic rhinitis, unspecified: Secondary | ICD-10-CM

## 2023-03-10 ENCOUNTER — Other Ambulatory Visit: Payer: Self-pay | Admitting: Internal Medicine

## 2023-03-10 ENCOUNTER — Encounter: Payer: Self-pay | Admitting: Internal Medicine

## 2023-03-10 DIAGNOSIS — Z8669 Personal history of other diseases of the nervous system and sense organs: Secondary | ICD-10-CM

## 2023-03-15 ENCOUNTER — Ambulatory Visit (INDEPENDENT_AMBULATORY_CARE_PROVIDER_SITE_OTHER): Payer: BC Managed Care – PPO

## 2023-03-15 DIAGNOSIS — J309 Allergic rhinitis, unspecified: Secondary | ICD-10-CM | POA: Diagnosis not present

## 2023-03-16 ENCOUNTER — Encounter: Payer: BC Managed Care – PPO | Admitting: Radiology

## 2023-04-03 ENCOUNTER — Other Ambulatory Visit: Payer: Self-pay | Admitting: Internal Medicine

## 2023-04-03 DIAGNOSIS — E785 Hyperlipidemia, unspecified: Secondary | ICD-10-CM

## 2023-04-03 DIAGNOSIS — Z8669 Personal history of other diseases of the nervous system and sense organs: Secondary | ICD-10-CM

## 2023-04-06 DIAGNOSIS — F4323 Adjustment disorder with mixed anxiety and depressed mood: Secondary | ICD-10-CM | POA: Diagnosis not present

## 2023-04-20 ENCOUNTER — Ambulatory Visit: Payer: Self-pay

## 2023-04-20 DIAGNOSIS — J309 Allergic rhinitis, unspecified: Secondary | ICD-10-CM

## 2023-05-03 ENCOUNTER — Other Ambulatory Visit: Payer: Self-pay | Admitting: Internal Medicine

## 2023-05-03 DIAGNOSIS — Z8669 Personal history of other diseases of the nervous system and sense organs: Secondary | ICD-10-CM

## 2023-05-18 ENCOUNTER — Ambulatory Visit (INDEPENDENT_AMBULATORY_CARE_PROVIDER_SITE_OTHER): Payer: BC Managed Care – PPO | Admitting: *Deleted

## 2023-05-18 DIAGNOSIS — J309 Allergic rhinitis, unspecified: Secondary | ICD-10-CM | POA: Diagnosis not present

## 2023-05-24 ENCOUNTER — Other Ambulatory Visit: Payer: Self-pay | Admitting: Internal Medicine

## 2023-05-24 DIAGNOSIS — Z8669 Personal history of other diseases of the nervous system and sense organs: Secondary | ICD-10-CM

## 2023-06-02 DIAGNOSIS — B349 Viral infection, unspecified: Secondary | ICD-10-CM | POA: Diagnosis not present

## 2023-06-02 DIAGNOSIS — R0981 Nasal congestion: Secondary | ICD-10-CM | POA: Diagnosis not present

## 2023-06-02 DIAGNOSIS — Z20822 Contact with and (suspected) exposure to covid-19: Secondary | ICD-10-CM | POA: Diagnosis not present

## 2023-06-02 DIAGNOSIS — J018 Other acute sinusitis: Secondary | ICD-10-CM | POA: Diagnosis not present

## 2023-06-21 ENCOUNTER — Other Ambulatory Visit: Payer: Self-pay | Admitting: Internal Medicine

## 2023-06-21 DIAGNOSIS — F339 Major depressive disorder, recurrent, unspecified: Secondary | ICD-10-CM

## 2023-06-21 DIAGNOSIS — Z8669 Personal history of other diseases of the nervous system and sense organs: Secondary | ICD-10-CM

## 2023-06-22 ENCOUNTER — Ambulatory Visit (INDEPENDENT_AMBULATORY_CARE_PROVIDER_SITE_OTHER): Payer: Self-pay | Admitting: *Deleted

## 2023-06-22 DIAGNOSIS — J309 Allergic rhinitis, unspecified: Secondary | ICD-10-CM

## 2023-06-28 ENCOUNTER — Other Ambulatory Visit: Payer: Self-pay | Admitting: Internal Medicine

## 2023-06-28 DIAGNOSIS — F339 Major depressive disorder, recurrent, unspecified: Secondary | ICD-10-CM

## 2023-06-29 DIAGNOSIS — F4323 Adjustment disorder with mixed anxiety and depressed mood: Secondary | ICD-10-CM | POA: Diagnosis not present

## 2023-07-15 ENCOUNTER — Other Ambulatory Visit: Payer: Self-pay | Admitting: Internal Medicine

## 2023-07-15 DIAGNOSIS — E785 Hyperlipidemia, unspecified: Secondary | ICD-10-CM

## 2023-07-15 DIAGNOSIS — F419 Anxiety disorder, unspecified: Secondary | ICD-10-CM

## 2023-07-22 DIAGNOSIS — F4323 Adjustment disorder with mixed anxiety and depressed mood: Secondary | ICD-10-CM | POA: Diagnosis not present

## 2023-07-25 ENCOUNTER — Other Ambulatory Visit: Payer: Self-pay | Admitting: Internal Medicine

## 2023-07-25 DIAGNOSIS — Z8669 Personal history of other diseases of the nervous system and sense organs: Secondary | ICD-10-CM

## 2023-07-27 ENCOUNTER — Encounter: Payer: Self-pay | Admitting: Psychiatry

## 2023-08-08 ENCOUNTER — Ambulatory Visit (INDEPENDENT_AMBULATORY_CARE_PROVIDER_SITE_OTHER): Payer: BC Managed Care – PPO

## 2023-08-08 ENCOUNTER — Other Ambulatory Visit (HOSPITAL_COMMUNITY): Payer: Self-pay

## 2023-08-08 DIAGNOSIS — J309 Allergic rhinitis, unspecified: Secondary | ICD-10-CM

## 2023-08-08 MED ORDER — EPINEPHRINE 0.3 MG/0.3ML IJ SOAJ: 0.3000 mg | INTRAMUSCULAR | 0 refills | Status: AC | PRN

## 2023-08-14 DIAGNOSIS — J0100 Acute maxillary sinusitis, unspecified: Secondary | ICD-10-CM | POA: Diagnosis not present

## 2023-08-14 DIAGNOSIS — G43909 Migraine, unspecified, not intractable, without status migrainosus: Secondary | ICD-10-CM | POA: Diagnosis not present

## 2023-08-17 ENCOUNTER — Other Ambulatory Visit: Payer: Self-pay | Admitting: Internal Medicine

## 2023-08-17 DIAGNOSIS — Z8669 Personal history of other diseases of the nervous system and sense organs: Secondary | ICD-10-CM

## 2023-08-19 ENCOUNTER — Telehealth: Payer: Self-pay | Admitting: Internal Medicine

## 2023-08-19 ENCOUNTER — Emergency Department (HOSPITAL_COMMUNITY): Payer: BC Managed Care – PPO

## 2023-08-19 ENCOUNTER — Encounter (HOSPITAL_COMMUNITY): Payer: Self-pay

## 2023-08-19 ENCOUNTER — Ambulatory Visit: Payer: BC Managed Care – PPO | Admitting: Family Medicine

## 2023-08-19 ENCOUNTER — Other Ambulatory Visit: Payer: Self-pay

## 2023-08-19 ENCOUNTER — Emergency Department (HOSPITAL_COMMUNITY)
Admission: EM | Admit: 2023-08-19 | Discharge: 2023-08-19 | Disposition: A | Discharge status: Home / Self Care | Payer: BC Managed Care – PPO | Attending: Emergency Medicine | Admitting: Emergency Medicine

## 2023-08-19 DIAGNOSIS — R519 Headache, unspecified: Secondary | ICD-10-CM | POA: Insufficient documentation

## 2023-08-19 DIAGNOSIS — D352 Benign neoplasm of pituitary gland: Secondary | ICD-10-CM | POA: Diagnosis not present

## 2023-08-19 DIAGNOSIS — R7401 Elevation of levels of liver transaminase levels: Secondary | ICD-10-CM | POA: Insufficient documentation

## 2023-08-19 LAB — COMPREHENSIVE METABOLIC PANEL
ALT: 160 U/L — ABNORMAL HIGH (ref 0–44)
AST: 79 U/L — ABNORMAL HIGH (ref 15–41)
Albumin: 3.5 g/dL (ref 3.5–5.0)
Alkaline Phosphatase: 80 U/L (ref 38–126)
Anion gap: 7 (ref 5–15)
BUN: 12 mg/dL (ref 6–20)
CO2: 23 mmol/L (ref 22–32)
Calcium: 9 mg/dL (ref 8.9–10.3)
Chloride: 106 mmol/L (ref 98–111)
Creatinine, Ser: 0.55 mg/dL (ref 0.44–1.00)
GFR, Estimated: >60 mL/min (ref >60)
Glucose, Bld: 102 mg/dL — ABNORMAL HIGH (ref 70–99)
Potassium: 4 mmol/L (ref 3.5–5.1)
Sodium: 136 mmol/L (ref 135–145)
Total Bilirubin: 0.6 mg/dL (ref <1.2)
Total Protein: 6.2 g/dL — ABNORMAL LOW (ref 6.5–8.1)

## 2023-08-19 LAB — CBC
HCT: 40.8 % (ref 36.0–46.0)
Hemoglobin: 13.9 g/dL (ref 12.0–15.0)
MCH: 30.1 pg (ref 26.0–34.0)
MCHC: 34.1 g/dL (ref 30.0–36.0)
MCV: 88.3 fL (ref 80.0–100.0)
Platelets: 222 10*3/uL (ref 150–400)
RBC: 4.62 MIL/uL (ref 3.87–5.11)
RDW: 12.6 % (ref 11.5–15.5)
WBC: 5.7 10*3/uL (ref 4.0–10.5)
nRBC: 0 % (ref 0.0–0.2)

## 2023-08-19 LAB — ACETAMINOPHEN LEVEL: Acetaminophen (Tylenol), Serum: <10 ug/mL — ABNORMAL LOW (ref 10–30)

## 2023-08-19 MED ORDER — DIPHENHYDRAMINE HCL 25 MG PO CAPS
25.0000 mg | ORAL_CAPSULE | Freq: Once | ORAL | Status: AC
  Administered 2023-08-19: 25 mg via ORAL
  Filled 2023-08-19: qty 1

## 2023-08-19 MED ORDER — PROCHLORPERAZINE MALEATE 5 MG PO TABS
10.0000 mg | ORAL_TABLET | Freq: Once | ORAL | Status: AC
  Administered 2023-08-19: 10 mg via ORAL
  Filled 2023-08-19: qty 2

## 2023-08-19 MED ORDER — DEXAMETHASONE SODIUM PHOSPHATE 4 MG/ML IJ SOLN
4.0000 mg | Freq: Once | INTRAMUSCULAR | Status: AC
  Administered 2023-08-19: 4 mg via INTRAVENOUS
  Filled 2023-08-19: qty 1

## 2023-08-19 NOTE — Telephone Encounter (Signed)
Pt called to inform MD that she went to the ED today. Pt has beenexperiencing extreme migraine headaches. Pt is wondering if MD could or should increase the dosage of the rizatriptan rizatriptan (MAXALT) 10 MG tablets  Pt informed MD is OOO on Fridays.  Please return Pt's call, at your earliest convenience.

## 2023-08-19 NOTE — ED Notes (Signed)
Patient transported to CT 

## 2023-08-19 NOTE — Discharge Instructions (Addendum)
Please follow-up with the neurologist have attached your for you today in regards to your headache and known pituitary adenoma.  Today your labs and imaging are reassuring most likely a tension headache.  Please continue to take your Maxalt as prescribed and relax over the next few days.  You may take Motrin every 6 hours as needed.  You may also take Tylenol however try to limit the Tylenol to 1 time a day to give your liver rest.  If symptoms change or worsen please return to the ER.

## 2023-08-19 NOTE — ED Notes (Signed)
ED Provider at bedside. 

## 2023-08-19 NOTE — ED Triage Notes (Signed)
Pt arrived from home via POV c/o headache that has been hurting x 1 week. Pt states that when seen in UC they gave her steroids that helped til she got home and then the headache returned. 10/10 on pain scale. Pt noted to be photophobic

## 2023-08-19 NOTE — ED Notes (Signed)
Pt returned to ED from CT

## 2023-08-19 NOTE — ED Provider Notes (Signed)
[facility] EMERGENCY DEPARTMENT AT [facility] Provider Note   CSN: [id number] Arrival date & time: 08/19/23  0225     History  Chief Complaint  Patient presents with   Migraine    Chelsea Dyer is a 57 y.o. female history of pituitary microadenoma, migraines presented for headache that is been present for the past week.  Headache was not sudden and maximal at onset.  Patient states this feels very similar to previous migraines.  Patient states that originally was on the left side however now feels like a band around her head.  Patient states that she is slightly photophobic with nausea however has not had a episode of emesis.  Patient was urgent care and was given steroids which did help however headache did come back.  Patient does take triptan however this has not been helping.  Patient does not see neurology for headaches.  Patient states she has been taking 1000 mg Tylenol twice daily for the past few days for headache which is slightly helped.  Patient denies any chest pain, shortness of breath, fevers, neck pain, inability to walk, dizziness, paresthesias, jaw claudication  Home Medications Prior to Admission medications   Medication Sig Start Date End Date Taking? Authorizing Provider  amoxicillin-clavulanate (AUGMENTIN) 875-125 MG tablet Take 1 tablet by mouth 2 (two) times daily. Patient not taking: Reported on 03/02/2023 01/22/23   [provider]  atorvastatin (LIPITOR) 40 MG tablet TAKE 1 TABLET BY MOUTH DAILY 07/18/23   Philip Aspen, Limmie Patricia, MD  busPIRone (BUSPAR) 15 MG tablet TAKE 1 TABLET BY MOUTH 2 TIMES A DAY 07/18/23   Philip Aspen, Limmie Patricia, MD  EPINEPHrine 0.3 mg/0.3 mL IJ SOAJ injection Inject 0.3 mg into the muscle as needed for anaphylaxis. 08/08/23   Nehemiah Settle, FNP  escitalopram (LEXAPRO) 10 MG tablet TAKE 1 TABLET BY MOUTH DAILY 06/28/23   Philip Aspen, Limmie Patricia, MD  ferrous sulfate 325 (65 FE) MG tablet Take 325 mg by mouth  daily with breakfast.    [provider]  fluticasone (FLONASE) 50 MCG/ACT nasal spray Place 2 sprays into both nostrils daily. 08/24/18   Philip Aspen, Limmie Patricia, MD  LORazepam (ATIVAN) 0.5 MG tablet TAKE ONE TABLET BY MOUTH TWICE A DAY AS NEEDED FOR ANXIETY 01/19/23   Corie Chiquito, PMHNP  meloxicam (MOBIC) 15 MG tablet Take 15 mg by mouth daily as needed for pain. Taking prn 04/03/21   [provider]  Multiple Vitamin (MULTI-VITAMINS) TABS Take 1 tablet by mouth daily at 6 (six) AM.    [provider]  Omega-3 Fatty Acids (FISH OIL) 1000 MG CAPS Take 2 capsules by mouth daily at 6 (six) AM.    [provider]  Pyridoxine HCl (VITAMIN B-6 PO) Take 1 tablet by mouth daily.    [provider]  rizatriptan (MAXALT) 10 MG tablet TAKE 1 TABLET BY MOUTH AT ONSET OF MIGRAINE; MAY REPEAT 1 TABLET IN 2 HOURS IF NEEDED. 08/17/23   Philip Aspen, Limmie Patricia, MD  venlafaxine XR (EFFEXOR-XR) 150 MG 24 hr capsule TAKE 2 CAPSULES BY MOUTH DAILY 06/28/23   Philip Aspen, Limmie Patricia, MD  vitamin B-12 (CYANOCOBALAMIN) 500 MCG tablet Take 1 tablet (500 mcg total) by mouth daily. 12/29/22   Philip Aspen, Limmie Patricia, MD  zolmitriptan (ZOMIG) 5 MG tablet TAKE 1 TABLET BY MOUTH FOR MIGRAINE HEADACHE. MAY REPEAT IN 2 HOURS IF NEEDED. NO MORE THAN 2 DOSES IN 24-HOUR PERIOD. 12/29/22   Philip Aspen, Limmie Patricia,  MD      Allergies    Percocet [oxycodone-acetaminophen], Zinc, and Septra [sulfamethoxazole-trimethoprim]    Review of Systems   Review of Systems  Physical Exam Updated Vital Signs BP 119/76 (BP Location: Right Arm)   Pulse 88   Temp 97.7 F (36.5 C)   Resp 15   LMP 08/24/2018   SpO2 99%  Physical Exam Constitutional:      Appearance: She is normal weight.  HENT:     Head: Normocephalic.     Comments: No temporal artery tenderness No jaw claudication Tenderness to bilateral temporalis muscles Headache is nonpositional No sinus tenderness Eyes:      Extraocular Movements: Extraocular movements intact.     Conjunctiva/sclera: Conjunctivae normal.     Pupils: Pupils are equal, round, and reactive to light.     Comments: Photophobic  Cardiovascular:     Rate and Rhythm: Normal rate and regular rhythm.     Pulses: Normal pulses.     Heart sounds: Normal heart sounds.  Pulmonary:     Effort: Pulmonary effort is normal.     Breath sounds: Normal breath sounds.  Abdominal:     Palpations: Abdomen is soft.     Tenderness: There is no abdominal tenderness. There is no guarding or rebound.  Musculoskeletal:        General: Normal range of motion.     Cervical back: Normal range of motion. No rigidity or tenderness.  Skin:    General: Skin is warm and dry.     Capillary Refill: Capillary refill takes less than 2 seconds.  Neurological:     General: No focal deficit present.     Mental Status: She is alert.     Sensory: Sensation is intact.     Motor: Motor function is intact.     Coordination: Coordination is intact.     Gait: Gait is intact.     Comments: Cranial nerves III through XII intact Vision grossly intact Sensation tact in all 4 extremities  Psychiatric:        Mood and Affect: Mood normal.     ED Results / Procedures / Treatments   Labs (all labs ordered are listed, but only abnormal results are displayed) Labs Reviewed  COMPREHENSIVE METABOLIC PANEL - Abnormal; Notable for the following components:      Result Value   Glucose, Bld 102 (*)    Total Protein 6.2 (*)    AST 79 (*)    ALT 160 (*)    All other components within normal limits  ACETAMINOPHEN LEVEL - Abnormal; Notable for the following components:   Acetaminophen (Tylenol), Serum <10 (*)    All other components within normal limits  CBC    EKG None  Radiology CT Head Wo Contrast  Result Date: 08/19/2023 CLINICAL DATA:  Headache. EXAM: CT HEAD WITHOUT CONTRAST TECHNIQUE: Contiguous axial images were obtained from the base of the skull  through the vertex without intravenous contrast. RADIATION DOSE REDUCTION: This exam was performed according to the departmental dose-optimization program which includes automated exposure control, adjustment of the mA and/or kV according to patient size and/or use of iterative reconstruction technique. COMPARISON:  Head MRI 01/30/2017 FINDINGS: Brain: There is no evidence of an acute infarct, intracranial hemorrhage, mass, midline shift, or extra-axial fluid collection. The ventricles and sulci are normal. Vascular: No hyperdense vessel. Skull: No acute fracture or suspicious osseous lesion. Sinuses/Orbits: Prior sinus surgery. Mild mucosal thickening in the ethmoid sinuses and left sphenoid sinus. Clear  mastoid air cells. Unremarkable orbits. Other: None. IMPRESSION: Unremarkable CT appearance of the brain. Electronically Signed   By: Sebastian Ache M.D.   On: 08/19/2023 10:59    Procedures Procedures    Medications Ordered in ED Medications  prochlorperazine (COMPAZINE) tablet 10 mg (10 mg Oral Given 08/19/23 1013)  diphenhydrAMINE (BENADRYL) capsule 25 mg (25 mg Oral Given 08/19/23 1012)  dexamethasone (DECADRON) injection 4 mg (4 mg Intravenous Given 08/19/23 1129)    ED Course/ Medical Decision Making/ A&P                                 Medical Decision Making Amount and/or Complexity of Data Reviewed Labs: ordered. Radiology: ordered.  Risk Prescription drug management.   Chelsea Dyer 57 y.o. presented today for headache. Working DDx that I considered at this time includes, but not limited to, tension headache, migraine, intracranial mass, intracranial hemorrhage, intracranial infection including meningitis vs encephalitis, GCA, trigeminal neuralgia, preeclampsia/eclampsia, AVM, sinusitis, cerebral aneurysm, muscular headache, cavernous sinus thrombosis, carotid artery dissection.  R/o DDx: intracranial mass, intracranial hemorrhage, intracranial infection including meningitis vs  encephalitis, GCA, trigeminal neuralgia, preeclampsia/eclampsia, AVM, sinusitis, cerebral aneurysm, muscular headache, cavernous sinus thrombosis, carotid artery dissection:  less likely due to history of present illness, physical exam, labs/imaging findings  Review of prior external notes: 08/14/2023 office visit  Unique Tests and My Interpretation:  CBC: Unremarkable CMP: Mild transaminitis 79/160 CT Head w/o Contrast: No acute findings APAP level: Unremarkable  Social Determinants of Health: none  Discussion with Independent Historian: None  Discussion of Management of Tests: None  Risk: Medium: prescription drug management  Risk Stratification Score: None  Plan: On exam patient was headache. Physical exam was in the dark as she was photophobic but otherwise had reassuring physical exam.  Patient does have tenderness to both temporalis muscles and describes a bandlike headache indicative of tension headache.  Presentation is like pts typical HA and non concerning for [facility], ICH, Meningitis, temporal arteritis, or other life-threatening headaches. Pt is afebrile with no focal neuro deficits, nuchal rigidity, or change in vision. Labs and imaging will be ordered. Patient will be given Compazine and Benadryl for HA treatment.  Patient did not have any peripheral visual changes that be indicative of the pituitary adenoma causing her headache and so will not get MRI at this time.  Patient stable at this time.  Patient's labs and imaging came back reassuring and patient's pain did improve with pain medications.  Patient did have mild transaminitis most likely secondary to her Tylenol use and so we will get Tylenol level however without an anion gap.  Low suspicion of acetaminophen toxicity.  I spoke to the patient about these findings and encouraged her to decrease her Tylenol use to which patient verbalized understanding acceptance of.  Will give patient a shot of Decadron as well as this seemed  to help previously.  At this time do suspect her headache is tension headache in nature but we will give her neurology follow-up as patient may need different migraine medications at home and so that she has follow-up for her pituitary adenoma which she states she has not seen one for.  Patient's headache did improve with medications here and so we will discharge with neurology follow-up encouraged OTCs every 6 hours as needed and to rest over the next few days.  Tylenol level was negative for being elevated.  Patient not have any maxillary sinus  tenderness on exam and so do not believe patient's headache is related to sinus pain however patient is already on azithromycin from the urgent care and so since she has 1 day left we will have her complete this.  Patient was given return precautions. Patient stable for discharge at this time.  Patient verbalized understanding of plan.  This chart was dictated using voice recognition software.  Despite best efforts to proofread,  errors can occur which can change the documentation meaning.         Final Clinical Impression(s) / ED Diagnoses Final diagnoses:  Bad headache  PITUITARY MICROADENOMA  Transaminitis    Rx / DC Orders ED Discharge Orders          Ordered    Ambulatory referral to Neurology       Comments: An appointment is requested in approximately: 1 week   08/19/23 1336              Remi Deter 08/19/23 1351    Jacalyn Lefevre, MD 08/20/23 [phone]

## 2023-08-22 ENCOUNTER — Encounter: Payer: Self-pay | Admitting: Neurology

## 2023-08-23 NOTE — Telephone Encounter (Signed)
Spoke to patient and she has an appointment with neurology 12/14/22.

## 2023-08-23 NOTE — Telephone Encounter (Signed)
Patient is aware 

## 2023-08-24 ENCOUNTER — Ambulatory Visit (INDEPENDENT_AMBULATORY_CARE_PROVIDER_SITE_OTHER): Payer: Self-pay

## 2023-08-24 DIAGNOSIS — J309 Allergic rhinitis, unspecified: Secondary | ICD-10-CM

## 2023-08-24 DIAGNOSIS — F4323 Adjustment disorder with mixed anxiety and depressed mood: Secondary | ICD-10-CM | POA: Diagnosis not present

## 2023-08-29 DIAGNOSIS — J3089 Other allergic rhinitis: Secondary | ICD-10-CM | POA: Diagnosis not present

## 2023-08-30 ENCOUNTER — Other Ambulatory Visit: Payer: Self-pay | Admitting: Psychiatry

## 2023-08-30 ENCOUNTER — Other Ambulatory Visit: Payer: Self-pay | Admitting: Internal Medicine

## 2023-08-30 DIAGNOSIS — F419 Anxiety disorder, unspecified: Secondary | ICD-10-CM

## 2023-08-30 DIAGNOSIS — Z8669 Personal history of other diseases of the nervous system and sense organs: Secondary | ICD-10-CM

## 2023-08-30 NOTE — Progress Notes (Signed)
VIALS EXP 08-29-24

## 2023-08-31 ENCOUNTER — Ambulatory Visit (INDEPENDENT_AMBULATORY_CARE_PROVIDER_SITE_OTHER): Payer: Self-pay | Admitting: *Deleted

## 2023-08-31 DIAGNOSIS — J309 Allergic rhinitis, unspecified: Secondary | ICD-10-CM

## 2023-09-20 DIAGNOSIS — F4323 Adjustment disorder with mixed anxiety and depressed mood: Secondary | ICD-10-CM | POA: Diagnosis not present

## 2023-09-21 ENCOUNTER — Ambulatory Visit (INDEPENDENT_AMBULATORY_CARE_PROVIDER_SITE_OTHER): Payer: BC Managed Care – PPO | Admitting: *Deleted

## 2023-09-21 DIAGNOSIS — J309 Allergic rhinitis, unspecified: Secondary | ICD-10-CM | POA: Diagnosis not present

## 2023-09-28 ENCOUNTER — Ambulatory Visit (INDEPENDENT_AMBULATORY_CARE_PROVIDER_SITE_OTHER): Payer: Self-pay | Admitting: *Deleted

## 2023-09-28 DIAGNOSIS — J309 Allergic rhinitis, unspecified: Secondary | ICD-10-CM

## 2023-10-03 NOTE — Progress Notes (Unsigned)
NEUROLOGY CONSULTATION NOTE  Chelsea Dyer MRN: [id number] DOB: 1966-09-09  Referring provider: Evlyn Kanner, PA-C Primary care provider: Chaya Jan, MD  Reason for consult:  headache  Assessment/Plan:   Migraine without aura, without status migrainosus, not intractable - last month had an intractable migraine in setting of increased stress.  No recurrence.   Pituitary microadenoma.     As it has been over 6 years since her last imaging, will order MRI of pituitary gland with and without contrast.  Further recommendations pending results. Rizatriptan as needed for acute migraine treatment Preventative migraine treatm   Subjective:  Chelsea Dyer is a 58 year old right-handed female with pituitary microadenoma and depression who presents for headache.  History supplemented by ED and referring provider's note.  Onset:  2009 Location:  left sided/temple/neck Quality:  throbbing Intensity:  moderate .   Aura:  absent Prodrome:  absent Associated symptoms:  Photophobia, phonophobia.  She denies associated unilateral numbness or weakness. Duration:  30 minutes with rizatriptan (otherwise hours) Frequency:  depends on stress level.  May be anywhere from none to 1 to 2 times a week. Triggers:  stress Relieving factors:  sleep, ice pack Activity:  aggravates  Seen in the ED on 08/19/2023 for a particularly severe migraine.  Didn't trust herself to drive due to impaired vision.  Felt nauseous and weak.  Would not respond to rizatriptan.  CT head personally reviewed was unremarkable.  It was determined that her increased migraines were triggered by emotional stressors.  She has started treatment for her depression and anxiety and hasn't had a recurrence of the severe migraine.  Currently, her typical migraines are controlled (no recent migraine)  Patient has known history of pituitary microadenoma.  MRI of brain with and without contrast on 01/30/2017 personally reviewed  demonstrated 3 mm pituitary microadenoma, otherwise normal.    Past NSAIDS/analgesics:  none Past abortive triptans:  sumatriptan tab, zolmitriptan Past abortive ergotamine:  none Past muscle relaxants:  none Past anti-emetic:  none Past antihypertensive medications:  none Past antidepressant medications:  none Past anticonvulsant medications:  none Past anti-CGRP:  none  Current NSAIDS/analgesics:  meloxicam 15mg  daily PRN Current triptans:  rizatriptan 10mg  Current ergotamine:  none Current anti-emetic:  none Current muscle relaxants:  none Current Antihypertensive medications:  none Current Antidepressant medications:  venlafaxine XR 300mg  daily, escitalopram 10mg  daily Current Anticonvulsant medications:  none Current anti-CGRP:  none Current Vitamins/Herbal/Supplements:  MVI, B6, B12 Current Antihistamines/Decongestants:  Flonase Other therapy:  massage Other medications:  buspirone, lorazepam   Caffeine:  1 cup of coffee in morning.  Diet cola daily Diet:  drinks 1 gallon water daily.  Does not skip meals Exercise:  no Depression:  yes, stable; Anxiety:  yes, improved Sleep hygiene:  good.  7-9 hours a night.  Family history of headache:  sister (migraines)      PAST MEDICAL HISTORY: Past Medical History:  Diagnosis Date   ALLERGIC RHINITIS 03/04/2008   Anxiety    on meds   DEPRESSION 03/04/2008   on meds   Edema    pedal   EXOGENOUS OBESITY 06/22/2010   PITUITARY MICROADENOMA 10/07/2009   Prolactinoma (HCC)    Seasonal allergies     PAST SURGICAL HISTORY: Past Surgical History:  Procedure Laterality Date   BREAST SURGERY  2004   augmentation   HAMMER TOE SURGERY     LIPOSUCTION     NASAL SINUS SURGERY      MEDICATIONS: Current Outpatient Medications on File  Prior to Visit  Medication Sig Dispense Refill   amoxicillin-clavulanate (AUGMENTIN) 875-125 MG tablet Take 1 tablet by mouth 2 (two) times daily. (Patient not taking: Reported on 03/02/2023)      atorvastatin (LIPITOR) 40 MG tablet TAKE 1 TABLET BY MOUTH DAILY 90 tablet 1   busPIRone (BUSPAR) 15 MG tablet TAKE 1 TABLET BY MOUTH 2 TIMES A DAY 180 tablet 1   EPINEPHrine 0.3 mg/0.3 mL IJ SOAJ injection Inject 0.3 mg into the muscle as needed for anaphylaxis. 0.3 mL 0   escitalopram (LEXAPRO) 10 MG tablet TAKE 1 TABLET BY MOUTH DAILY 90 tablet 1   ferrous sulfate 325 (65 FE) MG tablet Take 325 mg by mouth daily with breakfast.     fluticasone (FLONASE) 50 MCG/ACT nasal spray Place 2 sprays into both nostrils daily. 18.2 g 2   LORazepam (ATIVAN) 0.5 MG tablet TAKE ONE TABLET BY MOUTH TWICE A DAY AS NEEDED FOR ANXIETY 60 tablet 5   meloxicam (MOBIC) 15 MG tablet Take 15 mg by mouth daily as needed for pain. Taking prn     Multiple Vitamin (MULTI-VITAMINS) TABS Take 1 tablet by mouth daily at 6 (six) AM.     Omega-3 Fatty Acids (FISH OIL) 1000 MG CAPS Take 2 capsules by mouth daily at 6 (six) AM.     Pyridoxine HCl (VITAMIN B-6 PO) Take 1 tablet by mouth daily.     rizatriptan (MAXALT) 10 MG tablet TAKE 1 TABLET BY MOUTH AT ONSET OF MIGRAINE; MAY REPEAT 1 TABLET IN 2 HOURS IF NEEDED. 9 tablet 0   venlafaxine XR (EFFEXOR-XR) 150 MG 24 hr capsule TAKE 2 CAPSULES BY MOUTH DAILY 180 capsule 1   vitamin B-12 (CYANOCOBALAMIN) 500 MCG tablet Take 1 tablet (500 mcg total) by mouth daily. 90 tablet 1   zolmitriptan (ZOMIG) 5 MG tablet TAKE 1 TABLET BY MOUTH FOR MIGRAINE HEADACHE. MAY REPEAT IN 2 HOURS IF NEEDED. NO MORE THAN 2 DOSES IN 24-HOUR PERIOD. 5 tablet 1   No current facility-administered medications on file prior to visit.    ALLERGIES: Allergies  Allergen Reactions   Percocet [Oxycodone-Acetaminophen] Other (See Comments)    Interfered with patient's antidepressants; made pt feel dizzy   Zinc    Septra [Sulfamethoxazole-Trimethoprim] Rash    FAMILY HISTORY: Family History  Problem Relation Age of Onset   Arthritis Mother        osteo   Allergic rhinitis Mother    Depression  Mother    Down syndrome Daughter    Breast cancer Neg Hx    Colon polyps Neg Hx    Colon cancer Neg Hx    Esophageal cancer Neg Hx    Stomach cancer Neg Hx    Rectal cancer Neg Hx     Objective:  Blood pressure 126/77, pulse 82, height 5\' 2"  (1.575 m), weight 156 lb (70.8 kg), last menstrual period 08/24/2018, SpO2 100%. General: No acute distress.  Patient appears well-groomed.   Head:  Normocephalic/atraumatic Eyes:  fundi examined but not visualized Neck: supple, no paraspinal tenderness, full range of motion Heart: regular rate and rhythm Neurological Exam: Mental status: alert and oriented to person, place, and time, speech fluent and not dysarthric, language intact. Cranial nerves: CN I: not tested CN II: pupils equal, round and reactive to light, visual fields intact CN III, IV, VI:  full range of motion, no nystagmus, no ptosis CN V: facial sensation intact. CN VII: upper and lower face symmetric CN VIII: hearing intact CN IX,  X: gag intact, uvula midline CN XI: sternocleidomastoid and trapezius muscles intact CN XII: tongue midline Bulk & Tone: normal, no fasciculations. Motor:  muscle strength 5/5 throughout Sensation:  Pinprick and vibratory sensation intact. Deep Tendon Reflexes:  2+ throughout,  toes downgoing.   Finger to nose testing:  Without dysmetria.   Gait:  Normal station and stride.  Romberg negative.    Thank you for allowing me to take part in the care of this patient.  Shon Millet, DO  CC: Chaya Jan, MD

## 2023-10-04 ENCOUNTER — Ambulatory Visit: Payer: BC Managed Care – PPO | Admitting: Neurology

## 2023-10-04 ENCOUNTER — Encounter: Payer: Self-pay | Admitting: Neurology

## 2023-10-04 VITALS — BP 126/77 | HR 82 | Ht 62.0 in | Wt 156.0 lb

## 2023-10-04 DIAGNOSIS — D353 Benign neoplasm of craniopharyngeal duct: Secondary | ICD-10-CM | POA: Diagnosis not present

## 2023-10-04 DIAGNOSIS — G43009 Migraine without aura, not intractable, without status migrainosus: Secondary | ICD-10-CM

## 2023-10-04 DIAGNOSIS — D352 Benign neoplasm of pituitary gland: Secondary | ICD-10-CM | POA: Diagnosis not present

## 2023-10-04 NOTE — Patient Instructions (Addendum)
Check MRI of pituitary with and without contrast. We have sent a referral to [facility] for your MRI and they will call you directly to schedule your appointment. They are located at [address] [facility]. If you need to contact them directly please call [phone].  Follow up as needed.

## 2023-10-07 ENCOUNTER — Ambulatory Visit (INDEPENDENT_AMBULATORY_CARE_PROVIDER_SITE_OTHER): Payer: Self-pay

## 2023-10-07 DIAGNOSIS — J309 Allergic rhinitis, unspecified: Secondary | ICD-10-CM

## 2023-10-12 ENCOUNTER — Encounter: Payer: Self-pay | Admitting: Neurology

## 2023-10-13 ENCOUNTER — Ambulatory Visit (INDEPENDENT_AMBULATORY_CARE_PROVIDER_SITE_OTHER): Payer: BC Managed Care – PPO | Admitting: *Deleted

## 2023-10-13 DIAGNOSIS — J309 Allergic rhinitis, unspecified: Secondary | ICD-10-CM | POA: Diagnosis not present

## 2023-10-19 ENCOUNTER — Ambulatory Visit
Admission: RE | Admit: 2023-10-19 | Discharge: 2023-10-19 | Disposition: A | Discharge status: Home / Self Care | Payer: BC Managed Care – PPO | Source: Ambulatory Visit | Attending: Neurology

## 2023-10-19 DIAGNOSIS — D320 Benign neoplasm of cerebral meninges: Secondary | ICD-10-CM | POA: Diagnosis not present

## 2023-10-19 DIAGNOSIS — D352 Benign neoplasm of pituitary gland: Secondary | ICD-10-CM

## 2023-10-19 MED ORDER — GADOPICLENOL 0.5 MMOL/ML IV SOLN
7.0000 mL | Freq: Once | INTRAVENOUS | Status: AC | PRN
  Administered 2023-10-19: 7 mL via INTRAVENOUS

## 2023-10-24 DIAGNOSIS — Z5181 Encounter for therapeutic drug level monitoring: Secondary | ICD-10-CM | POA: Diagnosis not present

## 2023-10-24 DIAGNOSIS — F4323 Adjustment disorder with mixed anxiety and depressed mood: Secondary | ICD-10-CM | POA: Diagnosis not present

## 2023-11-04 NOTE — Progress Notes (Signed)
 Patient advised.

## 2023-11-09 ENCOUNTER — Ambulatory Visit (INDEPENDENT_AMBULATORY_CARE_PROVIDER_SITE_OTHER): Payer: Self-pay | Admitting: *Deleted

## 2023-11-09 DIAGNOSIS — J309 Allergic rhinitis, unspecified: Secondary | ICD-10-CM

## 2023-11-10 ENCOUNTER — Other Ambulatory Visit: Payer: Self-pay | Admitting: *Deleted

## 2023-11-10 NOTE — Telephone Encounter (Signed)
 Copied from CRM [id number]. Topic: Clinical - Prescription Issue >> Nov 10, 2023  2:48 PM Adele Barthel wrote: Reason for CRM: Patient is calling in regarding her refill of vitamin B12 500 mcg capsules. Advised she had one remaining refill according to her chart; however, she was advised by Karin Golden she has no remaining refills.   CB# [id number]

## 2023-11-14 NOTE — Addendum Note (Signed)
 Addended by: Kern Reap B on: 11/14/2023 07:50 AM   Modules accepted: Orders

## 2023-11-15 MED ORDER — VITAMIN B-12 500 MCG PO TABS: 500.0000 ug | ORAL_TABLET | Freq: Every day | ORAL | 1 refills | Status: DC

## 2023-11-23 DIAGNOSIS — F4323 Adjustment disorder with mixed anxiety and depressed mood: Secondary | ICD-10-CM | POA: Diagnosis not present

## 2023-11-30 DIAGNOSIS — F4323 Adjustment disorder with mixed anxiety and depressed mood: Secondary | ICD-10-CM | POA: Diagnosis not present

## 2023-12-07 ENCOUNTER — Ambulatory Visit (INDEPENDENT_AMBULATORY_CARE_PROVIDER_SITE_OTHER): Payer: Self-pay | Admitting: *Deleted

## 2023-12-07 DIAGNOSIS — J309 Allergic rhinitis, unspecified: Secondary | ICD-10-CM

## 2023-12-14 ENCOUNTER — Ambulatory Visit: Payer: BC Managed Care – PPO | Admitting: Neurology

## 2023-12-14 DIAGNOSIS — M79673 Pain in unspecified foot: Secondary | ICD-10-CM

## 2023-12-22 ENCOUNTER — Other Ambulatory Visit: Payer: Self-pay | Admitting: Internal Medicine

## 2023-12-22 DIAGNOSIS — F419 Anxiety disorder, unspecified: Secondary | ICD-10-CM

## 2023-12-22 MED ORDER — BUSPIRONE HCL 15 MG PO TABS: 15.0000 mg | ORAL_TABLET | Freq: Two times a day (BID) | ORAL | 1 refills | Status: DC

## 2023-12-22 NOTE — Telephone Encounter (Signed)
 Copied from CRM [id number]. Topic: Clinical - Medication Refill >> Dec 22, 2023  1:24 PM Denese Killings wrote: Most Recent Primary Care Visit:  Provider: Henderson Cloud  Department: [facility]  Visit Type: OFFICE VISIT  Date: 12/29/2022  Medication: busPIRone (BUSPAR) 15 MG tablet   *requesting a few days until Monday  Has the patient contacted their pharmacy? No (Agent: If no, request that the patient contact the pharmacy for the refill. If patient does not wish to contact the pharmacy document the reason why and proceed with request.) (Agent: If yes, when and what did the pharmacy advise?) patient is out of town  Is this the correct pharmacy for this prescription? Yes If no, delete pharmacy and type the correct one.  This is the patient's preferred pharmacy:  [facility] [address] [place], [place] [address] [phone]  Has the prescription been filled recently? Yes  Is the patient out of the medication? Yes patient is out of town and they extended her stay. She is just requesting a few until she gets back home  Has the patient been seen for an appointment in the last year OR does the patient have an upcoming appointment? Yes  Can we respond through MyChart? Yes  Agent: Please be advised that Rx refills may take up to 3 business days. We ask that you follow-up with your pharmacy.

## 2023-12-28 ENCOUNTER — Other Ambulatory Visit: Payer: Self-pay | Admitting: Internal Medicine

## 2023-12-28 DIAGNOSIS — F339 Major depressive disorder, recurrent, unspecified: Secondary | ICD-10-CM

## 2024-01-07 ENCOUNTER — Other Ambulatory Visit: Payer: Self-pay | Admitting: Internal Medicine

## 2024-01-07 DIAGNOSIS — Z8669 Personal history of other diseases of the nervous system and sense organs: Secondary | ICD-10-CM

## 2024-01-11 ENCOUNTER — Ambulatory Visit (INDEPENDENT_AMBULATORY_CARE_PROVIDER_SITE_OTHER)

## 2024-01-11 DIAGNOSIS — J309 Allergic rhinitis, unspecified: Secondary | ICD-10-CM

## 2024-01-21 ENCOUNTER — Other Ambulatory Visit: Payer: Self-pay | Admitting: Internal Medicine

## 2024-01-21 DIAGNOSIS — F419 Anxiety disorder, unspecified: Secondary | ICD-10-CM

## 2024-01-24 DIAGNOSIS — E6609 Other obesity due to excess calories: Secondary | ICD-10-CM | POA: Diagnosis not present

## 2024-01-24 DIAGNOSIS — F32A Depression, unspecified: Secondary | ICD-10-CM | POA: Diagnosis not present

## 2024-01-24 DIAGNOSIS — R030 Elevated blood-pressure reading, without diagnosis of hypertension: Secondary | ICD-10-CM | POA: Diagnosis not present

## 2024-01-24 DIAGNOSIS — E8889 Other specified metabolic disorders: Secondary | ICD-10-CM | POA: Diagnosis not present

## 2024-02-01 DIAGNOSIS — R040 Epistaxis: Secondary | ICD-10-CM | POA: Diagnosis not present

## 2024-02-01 DIAGNOSIS — J3489 Other specified disorders of nose and nasal sinuses: Secondary | ICD-10-CM | POA: Diagnosis not present

## 2024-02-02 ENCOUNTER — Other Ambulatory Visit: Payer: Self-pay | Admitting: Internal Medicine

## 2024-02-02 DIAGNOSIS — Z8669 Personal history of other diseases of the nervous system and sense organs: Secondary | ICD-10-CM

## 2024-02-02 MED ORDER — RIZATRIPTAN BENZOATE 10 MG PO TABS: ORAL_TABLET | ORAL | 0 refills | Status: AC

## 2024-02-08 DIAGNOSIS — E6609 Other obesity due to excess calories: Secondary | ICD-10-CM | POA: Diagnosis not present

## 2024-02-08 DIAGNOSIS — F32A Depression, unspecified: Secondary | ICD-10-CM | POA: Diagnosis not present

## 2024-02-08 DIAGNOSIS — R030 Elevated blood-pressure reading, without diagnosis of hypertension: Secondary | ICD-10-CM | POA: Diagnosis not present

## 2024-02-15 ENCOUNTER — Ambulatory Visit (INDEPENDENT_AMBULATORY_CARE_PROVIDER_SITE_OTHER): Payer: Self-pay

## 2024-02-15 DIAGNOSIS — J309 Allergic rhinitis, unspecified: Secondary | ICD-10-CM | POA: Diagnosis not present

## 2024-02-22 DIAGNOSIS — E6609 Other obesity due to excess calories: Secondary | ICD-10-CM | POA: Diagnosis not present

## 2024-02-22 DIAGNOSIS — F419 Anxiety disorder, unspecified: Secondary | ICD-10-CM | POA: Diagnosis not present

## 2024-02-22 DIAGNOSIS — E66811 Obesity, class 1: Secondary | ICD-10-CM | POA: Diagnosis not present

## 2024-02-22 DIAGNOSIS — R030 Elevated blood-pressure reading, without diagnosis of hypertension: Secondary | ICD-10-CM | POA: Diagnosis not present

## 2024-02-22 DIAGNOSIS — F4323 Adjustment disorder with mixed anxiety and depressed mood: Secondary | ICD-10-CM | POA: Diagnosis not present

## 2024-02-22 DIAGNOSIS — F32A Depression, unspecified: Secondary | ICD-10-CM | POA: Diagnosis not present

## 2024-02-29 DIAGNOSIS — F419 Anxiety disorder, unspecified: Secondary | ICD-10-CM | POA: Diagnosis not present

## 2024-02-29 DIAGNOSIS — F4323 Adjustment disorder with mixed anxiety and depressed mood: Secondary | ICD-10-CM | POA: Diagnosis not present

## 2024-03-01 DIAGNOSIS — M79673 Pain in unspecified foot: Secondary | ICD-10-CM

## 2024-03-07 DIAGNOSIS — F419 Anxiety disorder, unspecified: Secondary | ICD-10-CM | POA: Diagnosis not present

## 2024-03-07 DIAGNOSIS — E6609 Other obesity due to excess calories: Secondary | ICD-10-CM | POA: Diagnosis not present

## 2024-03-07 DIAGNOSIS — F32A Depression, unspecified: Secondary | ICD-10-CM | POA: Diagnosis not present

## 2024-03-07 DIAGNOSIS — R030 Elevated blood-pressure reading, without diagnosis of hypertension: Secondary | ICD-10-CM | POA: Diagnosis not present

## 2024-03-07 DIAGNOSIS — F4323 Adjustment disorder with mixed anxiety and depressed mood: Secondary | ICD-10-CM | POA: Diagnosis not present

## 2024-03-14 ENCOUNTER — Ambulatory Visit (INDEPENDENT_AMBULATORY_CARE_PROVIDER_SITE_OTHER)

## 2024-03-14 DIAGNOSIS — J309 Allergic rhinitis, unspecified: Secondary | ICD-10-CM | POA: Diagnosis not present

## 2024-03-21 DIAGNOSIS — F419 Anxiety disorder, unspecified: Secondary | ICD-10-CM | POA: Diagnosis not present

## 2024-03-21 DIAGNOSIS — F4323 Adjustment disorder with mixed anxiety and depressed mood: Secondary | ICD-10-CM | POA: Diagnosis not present

## 2024-03-26 DIAGNOSIS — E66811 Obesity, class 1: Secondary | ICD-10-CM | POA: Diagnosis not present

## 2024-03-26 DIAGNOSIS — R030 Elevated blood-pressure reading, without diagnosis of hypertension: Secondary | ICD-10-CM | POA: Diagnosis not present

## 2024-03-26 DIAGNOSIS — F32A Depression, unspecified: Secondary | ICD-10-CM | POA: Diagnosis not present

## 2024-03-26 DIAGNOSIS — E6609 Other obesity due to excess calories: Secondary | ICD-10-CM | POA: Diagnosis not present

## 2024-03-27 ENCOUNTER — Other Ambulatory Visit: Payer: Self-pay | Admitting: Internal Medicine

## 2024-03-27 DIAGNOSIS — F339 Major depressive disorder, recurrent, unspecified: Secondary | ICD-10-CM

## 2024-03-27 DIAGNOSIS — Z8669 Personal history of other diseases of the nervous system and sense organs: Secondary | ICD-10-CM

## 2024-04-30 DIAGNOSIS — J301 Allergic rhinitis due to pollen: Secondary | ICD-10-CM

## 2024-04-30 DIAGNOSIS — J3089 Other allergic rhinitis: Secondary | ICD-10-CM

## 2024-04-30 NOTE — Progress Notes (Signed)
 VIALS MADE ON 04/30/24

## 2024-05-21 ENCOUNTER — Other Ambulatory Visit: Payer: Self-pay | Admitting: Internal Medicine

## 2024-05-21 DIAGNOSIS — Z8669 Personal history of other diseases of the nervous system and sense organs: Secondary | ICD-10-CM

## 2024-05-21 DIAGNOSIS — F339 Major depressive disorder, recurrent, unspecified: Secondary | ICD-10-CM

## 2024-05-21 NOTE — Telephone Encounter (Unsigned)
 Copied from CRM [id number]. Topic: Clinical - Medication Refill >> May 21, 2024  2:09 PM Rosina BIRCH wrote: Medication: escitalopram  (LEXAPRO ) 10 MG tablet, rizatriptan , vitamin B12  Has the patient contacted their pharmacy? No (Agent: If no, request that the patient contact the pharmacy for the refill. If patient does not wish to contact the pharmacy document the reason why and proceed with request.) (Agent: If yes, when and what did the pharmacy advise?)  This is the patient's preferred pharmacy:  [id number] [place], [place] - [address] AT [facility] [address] [place] [address] Phone: [phone] Fax: [phone]  Is this the correct pharmacy for this prescription? Yes If no, delete pharmacy and type the correct one.   Has the prescription been filled recently? Yes  Is the patient out of the medication? Yes  Has the patient been seen for an appointment in the last year OR does the patient have an upcoming appointment? Yes  Can we respond through MyChart? Yes  Agent: Please be advised that Rx refills may take up to 3 business days. We ask that you follow-up with your pharmacy.

## 2024-05-22 MED ORDER — ESCITALOPRAM OXALATE 10 MG PO TABS: 10.0000 mg | ORAL_TABLET | Freq: Every day | ORAL | 1 refills | Status: AC

## 2024-05-22 MED ORDER — VITAMIN B-12 500 MCG PO TABS: 500.0000 ug | ORAL_TABLET | Freq: Every day | ORAL | 1 refills | Status: AC

## 2024-05-22 MED ORDER — RIZATRIPTAN BENZOATE 10 MG PO TABS: ORAL_TABLET | ORAL | 0 refills | Status: DC

## 2024-07-26 ENCOUNTER — Other Ambulatory Visit: Payer: Self-pay | Admitting: Internal Medicine

## 2024-08-28 ENCOUNTER — Other Ambulatory Visit: Payer: Self-pay | Admitting: Internal Medicine

## 2024-08-28 DIAGNOSIS — Z8669 Personal history of other diseases of the nervous system and sense organs: Secondary | ICD-10-CM

## 2024-08-28 MED ORDER — RIZATRIPTAN BENZOATE 10 MG PO TABS: ORAL_TABLET | ORAL | 0 refills | Status: AC

## 2024-08-28 NOTE — Telephone Encounter (Signed)
 Copied from CRM #[id number]. Topic: Clinical - Medication Refill >> Aug 28, 2024  2:32 PM Nessti S wrote: Medication: rizatriptan  (MAXALT ) 10 MG tablet   Has the patient contacted their pharmacy? Yes (Agent: If no, request that the patient contact the pharmacy for the refill. If patient does not wish to contact the pharmacy document the reason why and proceed with request.) (Agent: If yes, when and what did the pharmacy advise?)  This is the patient's preferred pharmacy:  [id number] [place], [place] - [address] [place] [address] Phone: [phone] Fax: [phone]  Is this the correct pharmacy for this prescription? Yes If no, delete pharmacy and type the correct one.   Has the prescription been filled recently? No  Is the patient out of the medication? Yes  Has the patient been seen for an appointment in the last year OR does the patient have an upcoming appointment? No  Can we respond through MyChart? Yes  Agent: Please be advised that Rx refills may take up to 3 business days. We ask that you follow-up with your pharmacy.

## 2024-10-15 ENCOUNTER — Other Ambulatory Visit: Payer: Self-pay | Admitting: Internal Medicine

## 2024-10-15 DIAGNOSIS — Z8669 Personal history of other diseases of the nervous system and sense organs: Secondary | ICD-10-CM
